# Patient Record
Sex: Female | Born: 1970 | Race: Black or African American | Hispanic: No | Marital: Single | Smoking: Never smoker
Health system: Southern US, Community
[De-identification: ages and names within clinical notes are randomized; demographics above are authoritative.]

## PROBLEM LIST (undated history)

## (undated) DIAGNOSIS — M65939 Unspecified synovitis and tenosynovitis, unspecified forearm: Secondary | ICD-10-CM

## (undated) DIAGNOSIS — D259 Leiomyoma of uterus, unspecified: Secondary | ICD-10-CM

## (undated) DIAGNOSIS — D649 Anemia, unspecified: Secondary | ICD-10-CM

## (undated) DIAGNOSIS — E119 Type 2 diabetes mellitus without complications: Secondary | ICD-10-CM

## (undated) DIAGNOSIS — Z5189 Encounter for other specified aftercare: Secondary | ICD-10-CM

## (undated) DIAGNOSIS — Z9289 Personal history of other medical treatment: Secondary | ICD-10-CM

## (undated) DIAGNOSIS — M659 Synovitis and tenosynovitis, unspecified: Secondary | ICD-10-CM

## (undated) DIAGNOSIS — E539 Vitamin B deficiency, unspecified: Secondary | ICD-10-CM

## (undated) DIAGNOSIS — I5189 Other ill-defined heart diseases: Secondary | ICD-10-CM

## (undated) DIAGNOSIS — Z8489 Family history of other specified conditions: Secondary | ICD-10-CM

## (undated) HISTORY — DX: Other ill-defined heart diseases: I51.89

## (undated) HISTORY — DX: Type 2 diabetes mellitus without complications: E11.9

## (undated) HISTORY — PX: ABDOMINAL HYSTERECTOMY: SHX81

## (undated) HISTORY — PX: CHOLECYSTECTOMY: SHX55

---

## 2005-09-29 ENCOUNTER — Emergency Department: Payer: Self-pay | Admitting: Emergency Medicine

## 2005-11-28 ENCOUNTER — Emergency Department: Payer: Self-pay | Admitting: Emergency Medicine

## 2006-10-22 ENCOUNTER — Emergency Department: Payer: Self-pay | Admitting: Emergency Medicine

## 2007-10-06 ENCOUNTER — Emergency Department: Payer: Self-pay | Admitting: Emergency Medicine

## 2007-12-29 ENCOUNTER — Emergency Department: Payer: Self-pay | Admitting: Emergency Medicine

## 2009-07-15 ENCOUNTER — Emergency Department: Payer: Self-pay | Admitting: Emergency Medicine

## 2009-09-12 ENCOUNTER — Inpatient Hospital Stay: Payer: Self-pay | Admitting: Internal Medicine

## 2012-08-27 DIAGNOSIS — D259 Leiomyoma of uterus, unspecified: Secondary | ICD-10-CM

## 2012-08-27 HISTORY — DX: Leiomyoma of uterus, unspecified: D25.9

## 2013-06-04 ENCOUNTER — Inpatient Hospital Stay: Payer: Self-pay | Admitting: Internal Medicine

## 2013-06-04 LAB — FERRITIN: Ferritin (ARMC): <1 ng/mL — ABNORMAL LOW (ref 8–388)

## 2013-06-04 LAB — CBC
HCT: 18.7 % — ABNORMAL LOW (ref 35.0–47.0)
HGB: 5.7 g/dL — ABNORMAL LOW (ref 12.0–16.0)
MCHC: 30.2 g/dL — ABNORMAL LOW (ref 32.0–36.0)
Platelet: 343 10*3/uL (ref 150–440)
RDW: 33.7 % — ABNORMAL HIGH (ref 11.5–14.5)
WBC: 5.5 10*3/uL (ref 3.6–11.0)

## 2013-06-04 LAB — IRON AND TIBC
Iron Bind.Cap.(Total): 579 ug/dL — ABNORMAL HIGH (ref 250–450)
Iron: 17 ug/dL — ABNORMAL LOW (ref 50–170)

## 2013-06-04 LAB — TROPONIN I: Troponin-I: <0.02 ng/mL

## 2013-06-04 LAB — BASIC METABOLIC PANEL
Anion Gap: 5 — ABNORMAL LOW (ref 7–16)
Chloride: 109 mmol/L — ABNORMAL HIGH (ref 98–107)
Co2: 25 mmol/L (ref 21–32)
Creatinine: 0.66 mg/dL (ref 0.60–1.30)
EGFR (African American): >60
EGFR (Non-African Amer.): >60
Glucose: 110 mg/dL — ABNORMAL HIGH (ref 65–99)
Osmolality: 277 (ref 275–301)
Potassium: 3.9 mmol/L (ref 3.5–5.1)
Sodium: 139 mmol/L (ref 136–145)

## 2013-06-05 LAB — CBC WITH DIFFERENTIAL/PLATELET
Basophil #: 0 10*3/uL (ref 0.0–0.1)
Basophil %: 0.2 %
Eosinophil #: 0 10*3/uL (ref 0.0–0.7)
Eosinophil %: 0.1 %
HCT: 23.4 % — ABNORMAL LOW (ref 35.0–47.0)
HGB: 7.4 g/dL — ABNORMAL LOW (ref 12.0–16.0)
Lymphocyte #: 2.7 10*3/uL (ref 1.0–3.6)
Lymphocyte %: 43.8 %
MCHC: 31.6 g/dL — ABNORMAL LOW (ref 32.0–36.0)
Monocyte %: 11.6 %
Neutrophil #: 2.7 10*3/uL (ref 1.4–6.5)
Neutrophil %: 44.3 %
RBC: 3.39 10*6/uL — ABNORMAL LOW (ref 3.80–5.20)
RDW: 35.7 % — ABNORMAL HIGH (ref 11.5–14.5)

## 2013-06-05 LAB — BASIC METABOLIC PANEL
BUN: 8 mg/dL (ref 7–18)
Creatinine: 0.63 mg/dL (ref 0.60–1.30)
EGFR (African American): >60
EGFR (Non-African Amer.): >60
Glucose: 100 mg/dL — ABNORMAL HIGH (ref 65–99)
Osmolality: 278 (ref 275–301)
Sodium: 140 mmol/L (ref 136–145)

## 2013-06-05 LAB — TROPONIN I: Troponin-I: <0.02 ng/mL

## 2013-06-05 LAB — PROTIME-INR: INR: 1.1

## 2013-08-10 ENCOUNTER — Ambulatory Visit: Payer: Self-pay | Admitting: Obstetrics and Gynecology

## 2013-08-10 ENCOUNTER — Emergency Department: Payer: Self-pay | Admitting: Emergency Medicine

## 2013-08-10 LAB — BASIC METABOLIC PANEL
Chloride: 106 mmol/L (ref 98–107)
EGFR (Non-African Amer.): >60
Glucose: 91 mg/dL (ref 65–99)
Osmolality: 274 (ref 275–301)
Potassium: 3.2 mmol/L — ABNORMAL LOW (ref 3.5–5.1)
Sodium: 138 mmol/L (ref 136–145)

## 2014-03-08 ENCOUNTER — Emergency Department: Payer: Self-pay | Admitting: Emergency Medicine

## 2014-07-30 ENCOUNTER — Emergency Department: Payer: Self-pay | Admitting: Emergency Medicine

## 2014-12-17 NOTE — Consult Note (Signed)
PATIENT NAME:  Sharon Marquez, Sharon Marquez MR#:  [id number] DATE OF BIRTH:  [birth year]  DATE OF CONSULTATION:  06/05/2013  REFERRING PHYSICIAN:  Dr. Max Sane CONSULTING PHYSICIAN:  Boykin Nearing, MD  HISTORY OF PRESENT ILLNESS:  This is a 44 year old gravida 1, para 1, patient admitted to [facility] on 06/04/13 for chest pain. The patient was noted to have severe anemia with a hemoglobin of 5.7. The patient by history has a year of heavy menstrual flow bleeding for 11 days. The first several days a large amount of clotting. The patient has not been on contraception. She is not sexually active. The patient was admitted to [facility], received 2 units of blood. Hemoglobin has come up to 7 today.   PAST MEDICAL HISTORY: 1.  Menorrhagia. 2.  Cholecystitis.   PAST SURGICAL HISTORY:  Laparoscopic cholecystectomy, 1994.   REVIEW OF SYSTEMS:  (unremarkable except GYN : menorrhagia + fibroids)<<gyn : menorrhagia and fibroids> (we do not have a ROS template for Dr. Ouida Sills)  (Per template except for:  GENITOURINARY:  Menorrhagia. GENERAL:  Obesity. GASTROINTESTINAL:  Cholecystitis.)  FAMILY HISTORY:  No gynecologic cancers.   MEDICATIONS:  None.   SOCIAL HISTORY:  Does not smoke, does not drink.   ALLERGIES:  No known drug allergies.   PHYSICAL EXAMINATION:  GENERAL:  Well-developed, well-nourished, obese black female.  VITAL SIGNS:  Weight 252, blood pressure 112/68, temperature 98.3, pulse 70, BMI 46.  LUNGS:  Clear to auscultation.  CARDIOVASCULAR:  Regular rate and rhythm.  ABDOMEN:  Obese, soft, nontender.  PELVIC:  External genitalia, Tanner stage V. Vulva and labia:  No lesions. Vagina:  Normal physiologic discharge. Cervix:  No lesions. Uterus:  Eight weeks in size, slightly irregular. The exam is limited based on the patient's body habitus. Adnexa:  No mass, nontender.  ENDOMETRIAL BIOPSY PROCEDURE:  Betadine prep to the cervix. Pipelle  catheter placed the endometrial cavity. The uterus sounds to 7 cm. Adequate tissue removed.   ASSESSMENT:  Menorrhagia with severe anemia.   PLAN: 1.  Endometrial biopsy performed at [facility]. 2.  I have spoken to the patient regarding getting a pelvic ultrasound at [facility] to rule out fibroids, especially submucosal fibroids. 3.  I spoke to the patient regarding treatment options including continuous birth control pills versus a surgical intervention and ablative procedures, including NovaSure and Her Option. The patient will be started on Ortho-Novum or Necon 1/35, one tablet daily while in the hospital. Ultimately, the patient returned to see me at [facility] for further discussion on continued therapy.  ____________________________ Boykin Nearing, MD tjs:jm D: 06/05/2013 10:30:00 ET T: 06/05/2013 10:52:47 ET JOB#: [id number]  cc: Vipul S. Manuella Ghazi, MD Boykin Nearing, MD, <Dictator>   Boykin Nearing MD ELECTRONICALLY SIGNED 06/15/2013 22:08

## 2014-12-17 NOTE — Discharge Summary (Signed)
PATIENT NAME:  Sharon Marquez, FERCH MR#:  [id number] DATE OF BIRTH:  08-26-1971  DATE OF ADMISSION:  06/04/2013 DATE OF DISCHARGE:  06/05/2013  PRESENTING COMPLAINT: Chest pain.   DISCHARGE DIAGNOSES: 1.  Iron deficiency anemia status post 2 units blood transfusion, suspected due to menorrhagia.  2.  History of menorrhagia.  3.  Morbid obesity.   CONDITION ON DISCHARGE: Fair.   DISCHARGE MEDICATIONS: 1.  Neocon 35 one tablet p.o. daily.  2.  Seasonique biphasic extended release p.o. daily.  3.  Ferrous sulfate 325 mg p.o. t.i.d.   DISCHARGE DIET: Regular.   DISCHARGE FOLLOWUP: With Dr. Ouida Sills OB/GYN as outpatient.   LABORATORY DATA: At discharge: H and H 7.4 and 23.4, white count 6.1 and MCV 69. Basic metabolic panel within normal limits. The patient's hemoglobin at admission was 5.7. Serum iron is 17.   CONSULTANTS: OB/GYN with Dr. Ouida Sills.   BRIEF SUMMARY OF HOSPITAL COURSE: [name] is a 44 year old African American female with past medical history of iron deficiency anemia who comes to the Emergency Room with some chest pain. She was found to have hemoglobin of 5.7. She was admitted, received 2 units of blood transfusion. Her iron deficiency anemia is likely due to her menorrhagia, ongoing for the last several months. The patient just had her menstrual cycle. She is not actively bleeding. She was seen by Dr. Ouida Sills as outpatient and recommended pelvic ultrasound and follow up as outpatient for further evaluation. In the meantime, she was placed on birth control pills by Dr. Ouida Sills. The patient tolerated blood transfusion well. She was explained to keep up the appointment with Dr. Ouida Sills. Hospital stay otherwise remained stable. The patient remained a FULL CODE.   TIME SPENT: 40 minutes. ____________________________ Hart Rochester Posey Pronto, MD sap:sb D: 06/05/2013 15:30:17 ET T: 06/05/2013 17:17:28 ET JOB#: [id number]  cc: Dove Gresham A. Posey Pronto, MD, <Dictator> Boykin Nearing, MD Ilda Basset MD ELECTRONICALLY SIGNED 06/19/2013 15:04

## 2014-12-17 NOTE — H&P (Signed)
PATIENT NAME:  Sharon Marquez, Sharon Marquez MR#:  [id number] DATE OF BIRTH:  [birth year]  DATE OF ADMISSION:  06/04/2013  PRIMARY CARE PHYSICIAN: None.  REQUESTING PHYSICIAN: Dr. Neoma Laming.  CHIEF COMPLAINT: Chest pain.   HISTORY OF PRESENT ILLNESS: The patient is a 44 year old female with no significant medical problems. Is being admitted for severe anemia. The patient was at work around 7:30 in the morning when she started having severe  chest pain, lasted about five minutes, associated with shortness of breath and diaphoresis. Pain went away on its own, but again it started back and kept going on and off until she decided to come to the Emergency Department, and she was really worried as her father also had heart problems.   While in the ED she was found to have severe anemia with a hemoglobin of 5.7, with a hematocrit of 18.7 and is being admitted for further evaluation and management. She is on  2 units of packed red blood cells. On further talking, the patient reports having a known history of anemia before, with a hemoglobin as low as down to 8 in the past, which she attributes likely to heavy periods. The patient has not seen any OB/GYN doctor, and would like to see someone if possible while in the hospital.   PAST MEDICAL HISTORY: History of anemia.   PAST SURGICAL HISTORY:  Cholecystectomy.   ALLERGIES: No known drug allergies.    MEDICATIONS AT HOME: Ibuprofen, as needed for headache.   SOCIAL HISTORY: She works in a [employer].   No smoking or alcohol.   FAMILY HISTORY: Father had heart problems.   OB/GYN HISTORY: She has been having longer and longer menstrual periods. Her last menstrual period started last Tuesday, but she is still spotting today, and she reports this has been getting worse lately.   REVIEW OF SYSTEMS: CONSTITUTIONAL: No fever. Positive fatigue and weakness.  EYES: No blurred or double vision.  ENT: No tinnitus or ear pain.  RESPIRATORY: No cough, wheezing or  hemoptysis.  CARDIOVASCULAR: Positive for chest pressure, shortness of breath and diaphoresis.  GASTROINTESTINAL: No nausea, vomiting, diarrhea.  GENITOURINARY: No recent dysuria, hematuria.  ENDOCRINE: No polyuria or nocturia.  HEMATOLOGY: Positive for anemia. No easy bruising or bleeding. Heavy menstrual period.  SKIN: No rash or lesion.  MUSCULOSKELETAL: No arthritis or muscle cramp.  NEUROLOGIC: No tingling, numbness, weakness.  PSYCHIATRIC: No history of anxiety or depression.   PHYSICAL EXAMINATION: VITAL SIGNS: Temperature 98.9, heart rate 77, respirations 18 per minute, blood pressure 125/58. Initial blood pressure was 99/55. She is saturating 95% on room air.  GENERAL: The patient is a 44 year-old [place] American female lying in the bed comfortably. Pupils are equal, round, reactive to light and accommodation. No scleral icterus.  HEAD: Extraocular muscles intact.  HEENT: Head atraumatic, normocephalic. Oropharynx and nasopharynx clear.  NECK: Supple. No masses. No thyroid tenderness. EYES: She does have scleral pallor.  LUNGS: Clear to auscultation bilaterally. No wheezing, rales, rhonchi or crepitation.  CARDIOVASCULAR: S1, S2 normal. No murmurs, rubs, or gallop.  ABDOMEN: Soft, nontender, nondistended. Bowel sounds present. No organomegaly or mass.  EXTREMITIES: No pedal edema, cyanosis or clubbing.  NEUROLOGIC: Nonfocal examination. Cranial nerves II-XII intact. Muscle strength 5/5 in all extremities. Sensation intact. PSYCHIATRIC:  Patient is awake, alert, and oriented x 3.  SKIN: No obvious rash, lesion.   LAB RESULTS: Normal BMP. Normal troponin. CBC showed white count 5.5, hemoglobin 5.7, hematocrit 18.7, platelets 343.   EKG shows normal  sinus rhythm, no ST-T wave changes.   IMPRESSION AND PLAN:  1.  Severe symptomatic anemia, with hemoglobin of 5.7. She is typed and crossed in the Emergency Department and had already been on 2 units of packed red blood cells by the  Emergency Department physician. Her anemia is microcytic in nature, likely due to heavy and long menstruation blood loss. Will get iron studies at this time and monitor her hemoglobin stool.  2.  Unstable angina, with chest pressure, diaphoresis and shortness of breath, likely due to oxygen-carrying capacity due to severe anemia. She denies any known heart disease. We are going to do serial troponins and monitor on off-unit telemetry.  3. Menometrorrhagia, with long, heavy menstrual periods: Will consult with OB/GYN for further input. She is wanting to see someone. She has never had any OB followups before.   CODE STATUS: FULL CODE.   Total time taking care of this patient is 45 minutes.    ____________________________ Lucina Mellow. Manuella Ghazi, MD vss:dm D: 06/04/2013 13:35:53 ET T: 06/04/2013 14:08:48 ET JOB#: [id number]  cc: Amiree No S. Manuella Ghazi, MD, <Dictator> Lucina Mellow [facility] MD ELECTRONICALLY SIGNED 06/05/2013 13:22

## 2015-03-08 ENCOUNTER — Emergency Department: Payer: Managed Care, Other (non HMO)

## 2015-03-08 ENCOUNTER — Encounter: Payer: Self-pay | Admitting: Emergency Medicine

## 2015-03-08 ENCOUNTER — Emergency Department
Admission: EM | Admit: 2015-03-08 | Discharge: 2015-03-08 | Disposition: A | Discharge status: Home / Self Care | Payer: Managed Care, Other (non HMO) | Attending: Emergency Medicine | Admitting: Emergency Medicine

## 2015-03-08 ENCOUNTER — Other Ambulatory Visit: Payer: Self-pay

## 2015-03-08 DIAGNOSIS — R079 Chest pain, unspecified: Secondary | ICD-10-CM | POA: Diagnosis not present

## 2015-03-08 DIAGNOSIS — R0602 Shortness of breath: Secondary | ICD-10-CM | POA: Diagnosis not present

## 2015-03-08 HISTORY — DX: Anemia, unspecified: D64.9

## 2015-03-08 LAB — TROPONIN I
Troponin I: <0.03 ng/mL (ref <0.031)
Troponin I: <0.03 ng/mL (ref <0.031)

## 2015-03-08 LAB — BASIC METABOLIC PANEL
Anion gap: 6 (ref 5–15)
BUN: 10 mg/dL (ref 6–20)
CHLORIDE: 106 mmol/L (ref 101–111)
CO2: 25 mmol/L (ref 22–32)
Calcium: 8.5 mg/dL — ABNORMAL LOW (ref 8.9–10.3)
Creatinine, Ser: 0.63 mg/dL (ref 0.44–1.00)
GFR calc non Af Amer: >60 mL/min (ref >60)
Glucose, Bld: 170 mg/dL — ABNORMAL HIGH (ref 65–99)
Potassium: 3.3 mmol/L — ABNORMAL LOW (ref 3.5–5.1)
SODIUM: 137 mmol/L (ref 135–145)

## 2015-03-08 LAB — CBC
HCT: 19.9 % — ABNORMAL LOW (ref 35.0–47.0)
HEMOGLOBIN: 5.8 g/dL — AB (ref 12.0–16.0)
MCH: 18.5 pg — AB (ref 26.0–34.0)
MCHC: 29.1 g/dL — AB (ref 32.0–36.0)
MCV: 63.7 fL — ABNORMAL LOW (ref 80.0–100.0)
Platelets: 303 10*3/uL (ref 150–440)
RBC: 3.13 MIL/uL — ABNORMAL LOW (ref 3.80–5.20)
RDW: 37.1 % — ABNORMAL HIGH (ref 11.5–14.5)
WBC: 6.1 10*3/uL (ref 3.6–11.0)

## 2015-03-08 MED ORDER — IPRATROPIUM-ALBUTEROL 0.5-2.5 (3) MG/3ML IN SOLN
3.0000 mL | Freq: Once | RESPIRATORY_TRACT | Status: AC
  Administered 2015-03-08: 3 mL via RESPIRATORY_TRACT
  Filled 2015-03-08: qty 3

## 2015-03-08 MED ORDER — GI COCKTAIL ~~LOC~~
30.0000 mL | Freq: Once | ORAL | Status: AC
  Administered 2015-03-08: 30 mL via ORAL
  Filled 2015-03-08: qty 30

## 2015-03-08 MED ORDER — IBUPROFEN 800 MG PO TABS: 800.0000 mg | ORAL_TABLET | Freq: Three times a day (TID) | ORAL | Status: DC | PRN

## 2015-03-08 NOTE — ED Provider Notes (Signed)
[facility] Emergency Department Provider Note   ____________________________________________  Time seen: 5035  I have reviewed the triage vital signs and the nursing notes.   HISTORY  Chief Complaint Chest Pain   History limited by: Not Limited   HPI Sharon Marquez is a 44 y.o. female who presents to the emergency department today because of chest pain.The patient states that her chest pain started this morning. She describes it as a tightness in the central part of her chest. It is accompanied with some shortness of breath. Patient states she attempted to go to work however at work the pain got worse. The patient states he has never had pain like this before. Patient denies any recent travel no history of DVTs. Patient denies any history of high blood pressure, diabetes. She does have some family history of heart disease however this was at an older age.     Past Medical History  Diagnosis Date  . Anemia     There are no active problems to display for this patient.   History reviewed. No pertinent past surgical history.  No current outpatient prescriptions on file.  Allergies Review of patient's allergies indicates no known allergies.  History reviewed. No pertinent family history.  Social History History  Substance Use Topics  . Smoking status: Never Smoker   . Smokeless tobacco: Not on file  . Alcohol Use: No    Review of Systems  Constitutional: Negative for fever. Cardiovascular: Positive for chest pain. Respiratory: Positive for shortness of breath. Gastrointestinal: Negative for abdominal pain, vomiting and diarrhea. Genitourinary: Negative for dysuria. Musculoskeletal: Negative for back pain. Skin: Negative for rash. Neurological: Negative for headaches, focal weakness or numbness.   10-point ROS otherwise negative.  ____________________________________________   PHYSICAL EXAM:  VITAL SIGNS: ED Triage Vitals  Enc  Vitals Group     BP 03/08/15 1033 115/69 mmHg     Pulse Rate 03/08/15 1033 84     Resp 03/08/15 1033 22     Temp 03/08/15 1033 98.7 F (37.1 C)     Temp Source 03/08/15 1033 Oral     SpO2 03/08/15 1033 100 %     Weight 03/08/15 1033 265 lb (120.203 kg)     Height 03/08/15 1033 5\' 3"  (1.6 m)     Head Cir --      Peak Flow --      Pain Score 03/08/15 1034 9   Constitutional: Alert and oriented. Well appearing and in no distress. Eyes: Conjunctivae are normal. PERRL. Normal extraocular movements. ENT   Head: Normocephalic and atraumatic.   Nose: No congestion/rhinnorhea.   Mouth/Throat: Mucous membranes are moist.   Neck: No stridor. Hematological/Lymphatic/Immunilogical: No cervical lymphadenopathy. Cardiovascular: Normal rate, regular rhythm.  No murmurs, rubs, or gallops. Respiratory: Normal respiratory effort without tachypnea nor retractions. Breath sounds are clear and equal bilaterally. No wheezes/rales/rhonchi. Gastrointestinal: Soft and nontender. No distention.  Genitourinary: Deferred Musculoskeletal: Normal range of motion in all extremities. No joint effusions.  No lower extremity tenderness nor edema. Neurologic:  Normal speech and language. No gross focal neurologic deficits are appreciated. Speech is normal.  Skin:  Skin is warm, dry and intact. No rash noted. Psychiatric: Mood and affect are normal. Speech and behavior are normal. Patient exhibits appropriate insight and judgment.  ____________________________________________    LABS (pertinent positives/negatives)  Labs Reviewed  BASIC METABOLIC PANEL - Abnormal; Notable for the following:    Potassium 3.3 (*)    Glucose, Bld 170 (*)  Calcium 8.5 (*)    All other components within normal limits  CBC - Abnormal; Notable for the following:    RBC 3.13 (*)    Hemoglobin 5.8 (*)    HCT 19.9 (*)    MCV 63.7 (*)    MCH 18.5 (*)    MCHC 29.1 (*)    RDW 37.1 (*)    All other components within  normal limits  TROPONIN I     ____________________________________________   EKG  I, Nance Pear, attending physician, personally viewed and interpreted this EKG  EKG Time: 1035 Rate: 81 Rhythm: NSR Axis: left axis deviation Intervals: qtc 453 QRS: narrow ST changes: no st elevation  ____________________________________________    RADIOLOGY  CXR IMPRESSION: Negative. No active cardiopulmonary disease.  ____________________________________________   PROCEDURES  Procedure(s) performed: None  Critical Care performed: No  ____________________________________________   INITIAL IMPRESSION / ASSESSMENT AND PLAN / ED COURSE  Pertinent labs & imaging results that were available during my care of the patient were reviewed by me and considered in my medical decision making (see chart for details).  Patient presents to the emergency department with episode of chest pain and shortness of breath. EKG and 2 sets of trop without concerning findings. Chest x-ray without any concerning findings. Attempted DuoNeb without any significant improvement. Perc negative. At this point I think ACS, PE, pneumothorax, pneumonia unlikely. I think perhaps patient either has pericarditis or pleuritis. Will give high-dose ibuprofen. Did discuss return precautions with patient.  ____________________________________________   FINAL CLINICAL IMPRESSION(S) / ED DIAGNOSES  Final diagnoses:  Chest pain, unspecified chest pain type     Nance Pear, MD 03/08/15 1544

## 2015-03-08 NOTE — Discharge Instructions (Signed)
Please seek medical attention for any high fevers, chest pain, shortness of breath, change in behavior, persistent vomiting, bloody stool or any other new or concerning symptoms. ° °Chest Pain (Nonspecific) °It is often hard to give a specific diagnosis for the cause of chest pain. There is always a chance that your pain could be related to something serious, such as a heart attack or a blood clot in the lungs. You need to follow up with your health care provider for further evaluation. °CAUSES  °· Heartburn. °· Pneumonia or bronchitis. °· Anxiety or stress. °· Inflammation around your heart (pericarditis) or lung (pleuritis or pleurisy). °· A blood clot in the lung. °· A collapsed lung (pneumothorax). It can develop suddenly on its own (spontaneous pneumothorax) or from trauma to the chest. °· Shingles infection (herpes zoster virus). °The chest wall is composed of bones, muscles, and cartilage. Any of these can be the source of the pain. °· The bones can be bruised by injury. °· The muscles or cartilage can be strained by coughing or overwork. °· The cartilage can be affected by inflammation and become sore (costochondritis). °DIAGNOSIS  °Lab tests or other studies may be needed to find the cause of your pain. Your health care provider may have you take a test called an ambulatory electrocardiogram (ECG). An ECG records your heartbeat patterns over a 24-hour period. You may also have other tests, such as: °· Transthoracic echocardiogram (TTE). During echocardiography, sound waves are used to evaluate how blood flows through your heart. °· Transesophageal echocardiogram (TEE). °· Cardiac monitoring. This allows your health care provider to monitor your heart rate and rhythm in real time. °· Holter monitor. This is a portable device that records your heartbeat and can help diagnose heart arrhythmias. It allows your health care provider to track your heart activity for several days, if needed. °· Stress tests by  exercise or by giving medicine that makes the heart beat faster. °TREATMENT  °· Treatment depends on what may be causing your chest pain. Treatment may include: °¨ Acid blockers for heartburn. °¨ Anti-inflammatory medicine. °¨ Pain medicine for inflammatory conditions. °¨ Antibiotics if an infection is present. °· You may be advised to change lifestyle habits. This includes stopping smoking and avoiding alcohol, caffeine, and chocolate. °· You may be advised to keep your head raised (elevated) when sleeping. This reduces the chance of acid going backward from your stomach into your esophagus. °Most of the time, nonspecific chest pain will improve within 2-3 days with rest and mild pain medicine.  °HOME CARE INSTRUCTIONS  °· If antibiotics were prescribed, take them as directed. Finish them even if you start to feel better. °· For the next few days, avoid physical activities that bring on chest pain. Continue physical activities as directed. °· Do not use any tobacco products, including cigarettes, chewing tobacco, or electronic cigarettes. °· Avoid drinking alcohol. °· Only take medicine as directed by your health care provider. °· Follow your health care provider's suggestions for further testing if your chest pain does not go away. °· Keep any follow-up appointments you made. If you do not go to an appointment, you could develop lasting (chronic) problems with pain. If there is any problem keeping an appointment, call to reschedule. °SEEK MEDICAL CARE IF:  °· Your chest pain does not go away, even after treatment. °· You have a rash with blisters on your chest. °· You have a fever. °SEEK IMMEDIATE MEDICAL CARE IF:  °· You have increased chest   pain or pain that spreads to your arm, neck, jaw, back, or abdomen. °· You have shortness of breath. °· You have an increasing cough, or you cough up blood. °· You have severe back or abdominal pain. °· You feel nauseous or vomit. °· You have severe weakness. °· You  faint. °· You have chills. °This is an emergency. Do not wait to see if the pain will go away. Get medical help at once. Call your local emergency services (911 in [place] Do not drive yourself to the hospital. °MAKE SURE YOU:  °· Understand these instructions. °· Will watch your condition. °· Will get help right away if you are not doing well or get worse. °Document Released: 05/23/2005 Document Revised: 08/18/2013 Document Reviewed: 03/18/2008 °ExitCare® Patient Information ©2015 [employer]. This information is not intended to replace advice given to you by your health care provider. Make sure you discuss any questions you have with your health care provider. ° °

## 2015-03-08 NOTE — ED Notes (Signed)
Patient to ED with c/o crushing type chest pain that started this morning along with diaphoresis, patient reports she went to work but had to leave patient also reports some right side heaviness.

## 2015-03-08 NOTE — ED Notes (Signed)
MD at bedside. 

## 2015-03-08 NOTE — ED Notes (Addendum)
Accompanied with SOB intermittently since CP began. Pt alert and oriented X4, active, cooperative, pt in NAD. RR even and unlabored, color WNL.

## 2015-03-08 NOTE — ED Notes (Signed)
Patient transported to X-ray 

## 2015-06-01 ENCOUNTER — Encounter: Payer: Self-pay | Admitting: Emergency Medicine

## 2015-06-01 ENCOUNTER — Emergency Department
Admission: EM | Admit: 2015-06-01 | Discharge: 2015-06-05 | Disposition: A | Discharge status: Home / Self Care | Payer: Managed Care, Other (non HMO) | Attending: Emergency Medicine | Admitting: Emergency Medicine

## 2015-06-01 ENCOUNTER — Emergency Department: Payer: Managed Care, Other (non HMO)

## 2015-06-01 DIAGNOSIS — D649 Anemia, unspecified: Secondary | ICD-10-CM | POA: Diagnosis not present

## 2015-06-01 DIAGNOSIS — R079 Chest pain, unspecified: Secondary | ICD-10-CM | POA: Insufficient documentation

## 2015-06-01 DIAGNOSIS — D473 Essential (hemorrhagic) thrombocythemia: Secondary | ICD-10-CM | POA: Diagnosis not present

## 2015-06-01 DIAGNOSIS — R0602 Shortness of breath: Secondary | ICD-10-CM | POA: Diagnosis present

## 2015-06-01 LAB — BASIC METABOLIC PANEL
Anion gap: 7 (ref 5–15)
BUN: 6 mg/dL (ref 6–20)
CALCIUM: 8.7 mg/dL — AB (ref 8.9–10.3)
CO2: 25 mmol/L (ref 22–32)
CREATININE: 0.6 mg/dL (ref 0.44–1.00)
Chloride: 108 mmol/L (ref 101–111)
GFR calc Af Amer: >60 mL/min (ref >60)
Glucose, Bld: 136 mg/dL — ABNORMAL HIGH (ref 65–99)
Potassium: 3.6 mmol/L (ref 3.5–5.1)
SODIUM: 140 mmol/L (ref 135–145)

## 2015-06-01 LAB — CBC
HCT: 19 % — ABNORMAL LOW (ref 35.0–47.0)
Hemoglobin: 5.2 g/dL — ABNORMAL LOW (ref 12.0–16.0)
MCH: 16.9 pg — ABNORMAL LOW (ref 26.0–34.0)
MCHC: 27.4 g/dL — ABNORMAL LOW (ref 32.0–36.0)
MCV: 61.6 fL — ABNORMAL LOW (ref 80.0–100.0)
PLATELETS: 893 10*3/uL — AB (ref 150–440)
RBC: 3.09 MIL/uL — AB (ref 3.80–5.20)
RDW: 35.4 % — ABNORMAL HIGH (ref 11.5–14.5)
WBC: 3.9 10*3/uL (ref 3.6–11.0)

## 2015-06-01 LAB — PREPARE RBC (CROSSMATCH)

## 2015-06-01 LAB — TROPONIN I

## 2015-06-01 LAB — ABO/RH: ABO/RH(D): O POS

## 2015-06-01 MED ORDER — FERROUS SULFATE ER 142 (45 FE) MG PO TBCR: 1.0000 | EXTENDED_RELEASE_TABLET | Freq: Two times a day (BID) | ORAL | Status: DC

## 2015-06-01 MED ORDER — SODIUM CHLORIDE 0.9 % IV SOLN
10.0000 mL/h | Freq: Once | INTRAVENOUS | Status: AC
  Administered 2015-06-01: 10 mL/h via INTRAVENOUS

## 2015-06-01 MED ORDER — HYDROCODONE-ACETAMINOPHEN 5-325 MG PO TABS
1.0000 | ORAL_TABLET | Freq: Once | ORAL | Status: AC
  Administered 2015-06-01: 1 via ORAL
  Filled 2015-06-01: qty 1

## 2015-06-01 MED ORDER — TRAMADOL HCL 50 MG PO TABS: 50.0000 mg | ORAL_TABLET | Freq: Four times a day (QID) | ORAL | Status: AC | PRN

## 2015-06-01 NOTE — ED Notes (Signed)
Pt ambulatory in ed with c/o chest pain and shortness of breath. Pt with hx of anemia and low iron.

## 2015-06-01 NOTE — Discharge Instructions (Signed)
Please take your iron supplement as prescribed. Please follow-up with OB/GYN by calling the number provided for a follow-up appointment as soon as possible. Return to the emergency department for any further chest pain, shortness of breath, lightheadedness for a few pass out, or any other symptom personally concerning to yourself.   Anemia, Nonspecific Anemia is a condition in which the concentration of red blood cells or hemoglobin in the blood is below normal. Hemoglobin is a substance in red blood cells that carries oxygen to the tissues of the body. Anemia results in not enough oxygen reaching these tissues.  CAUSES  Common causes of anemia include:   Excessive bleeding. Bleeding may be internal or external. This includes excessive bleeding from periods (in women) or from the intestine.   Poor nutrition.   Chronic kidney, thyroid, and liver disease.  Bone marrow disorders that decrease red blood cell production.  Cancer and treatments for cancer.  HIV, AIDS, and their treatments.  Spleen problems that increase red blood cell destruction.  Blood disorders.  Excess destruction of red blood cells due to infection, medicines, and autoimmune disorders. SIGNS AND SYMPTOMS   Minor weakness.   Dizziness.   Headache.  Palpitations.   Shortness of breath, especially with exercise.   Paleness.  Cold sensitivity.  Indigestion.  Nausea.  Difficulty sleeping.  Difficulty concentrating. Symptoms may occur suddenly or they may develop slowly.  DIAGNOSIS  Additional blood tests are often needed. These help your health care provider determine the best treatment. Your health care provider will check your stool for blood and look for other causes of blood loss.  TREATMENT  Treatment varies depending on the cause of the anemia. Treatment can include:   Supplements of iron, vitamin F75, or folic acid.   Hormone medicines.   A blood transfusion. This may be needed if  blood loss is severe.   Hospitalization. This may be needed if there is significant continual blood loss.   Dietary changes.  Spleen removal. HOME CARE INSTRUCTIONS Keep all follow-up appointments. It often takes many weeks to correct anemia, and having your health care provider check on your condition and your response to treatment is very important. SEEK IMMEDIATE MEDICAL CARE IF:   You develop extreme weakness, shortness of breath, or chest pain.   You become dizzy or have trouble concentrating.  You develop heavy vaginal bleeding.   You develop a rash.   You have bloody or black, tarry stools.   You faint.   You vomit up blood.   You vomit repeatedly.   You have abdominal pain.  You have a fever or persistent symptoms for more than 2-3 days.   You have a fever and your symptoms suddenly get worse.   You are dehydrated.  MAKE SURE YOU:  Understand these instructions.  Will watch your condition.  Will get help right away if you are not doing well or get worse.   This information is not intended to replace advice given to you by your health care provider. Make sure you discuss any questions you have with your health care provider.   Document Released: 09/20/2004 Document Revised: 04/15/2013 Document Reviewed: 02/06/2013 Elsevier Interactive Patient Education [employer].

## 2015-06-01 NOTE — ED Provider Notes (Addendum)
[facility] Emergency Department Provider Note  Time seen: 2:09 PM  I have reviewed the triage vital signs and the nursing notes.   HISTORY  Chief Complaint Chest Pain and Shortness of Breath    HPI Sharon Marquez is a 44 y.o. female with a past medical history of anemia who presents the emergency department with symptoms of dyspnea on exertion and chest pain/pressure with exertion. According to the patient for the past 1-2 weeks she has had dyspnea on exertion, she states for the past 4-5 days she is also beginning chest pain/pressure anytime she exerts herself. Denies any cough, congestion. Does state nausea occasionally but denies any vomiting or diaphoresis. Patient has a history of anemia requiring transfusion in the past. Patient supposed be on iron supplements but is not taking them.Patient states heavy menstrual bleeding, her last period ended 2 weeks ago. She has been told by an OB/GYN that she needs a hysterectomy, but she did not have insurance at the time and never pursued this option. Denies any chest pain currently. Denies any shortness of breath currently. Describes her symptoms as moderate when exerting herself.     Past Medical History  Diagnosis Date  . Anemia     There are no active problems to display for this patient.   History reviewed. No pertinent past surgical history.  Current Outpatient Rx  Name  Route  Sig  Dispense  Refill  . acetaminophen (TYLENOL) 500 MG tablet   Oral   Take 1,000 mg by mouth every 6 (six) hours as needed.         Marland Kitchen ibuprofen (ADVIL,MOTRIN) 800 MG tablet   Oral   Take 1 tablet (800 mg total) by mouth every 8 (eight) hours as needed.   20 tablet   0     Allergies Review of patient's allergies indicates no known allergies.  History reviewed. No pertinent family history.  Social History Social History  Substance Use Topics  . Smoking status: Never Smoker   . Smokeless tobacco: None  . Alcohol  Use: No    Review of Systems Constitutional: Negative for fever. Cardiovascular: Positive for chest pain with exertion. Respiratory: Positive for shortness of breath with exertion. Gastrointestinal: Negative for abdominal pain, vomiting and diarrhea. Neurological: Negative for headache 10-point ROS otherwise negative.  ____________________________________________   PHYSICAL EXAM:  VITAL SIGNS: ED Triage Vitals  Enc Vitals Group     BP 06/01/15 1401 122/79 mmHg     Pulse Rate 06/01/15 1059 80     Resp --      Temp 06/01/15 1059 98.2 F (36.8 C)     Temp Source 06/01/15 1059 Oral     SpO2 06/01/15 1059 100 %     Weight 06/01/15 1059 245 lb (111.131 kg)     Height 06/01/15 1059 5\' 3"  (1.6 m)     Head Cir --      Peak Flow --      Pain Score --      Pain Loc --      Pain Edu? --      Excl. in [place]? --    Constitutional: Alert and oriented. Well appearing and in no distress. Eyes: Normal exam ENT   Head: Normocephalic and atraumatic.   Mouth/Throat: Mucous membranes are moist. Cardiovascular: Normal rate, regular rhythm. No murmur Respiratory: Normal respiratory effort without tachypnea nor retractions. Breath sounds are clear Gastrointestinal: Soft and nontender. No distention. Musculoskeletal: Nontender with normal range of motion in  all extremities. No lower extremity tenderness or edema. Neurologic:  Normal speech and language. No gross focal neurologic deficits are appreciated. Speech is normal. Skin:  Skin is warm, dry and intact.  Psychiatric: Mood and affect are normal. Speech and behavior are normal. Patient exhibits appropriate insight and judgment.  ____________________________________________    EKG  EKG reviewed and interpreted by myself shows normal sinus rhythm at 81 bpm, narrow QRS, normal axis, normal intervals, no ST changes noted. Overall normal EKG.  ____________________________________________    RADIOLOGY  Chest x-ray shows no acute  abnormalities.  ____________________________________________    INITIAL IMPRESSION / ASSESSMENT AND PLAN / ED COURSE  Pertinent labs & imaging results that were available during my care of the patient were reviewed by me and considered in my medical decision making (see chart for details).  Patient presents with dyspnea and chest pain on exertion, hemoglobin of 5.2. History of anemia requiring transfusions in the past. They believe her anemia is due to menorrhagia, her last period was 2 weeks ago.  Patient is prescribed iron supplements but admits she has not been taking them. Patient followed up with Dr.Schamerhorn of OB/GYN in the past, we will consult. We will begin blood transfusion. I discussed the risks and benefits with the patient she is agreeable to proceed with transfusion. As the patient is not actively bleeding do not believe she necessarily requires inpatient admission unless OB/GYN believes otherwise  Of note the patient's platelet count is fairly elevated today at 893. Patient may require hematology/oncology workup, which can likely occur as an outpatient.    Discussed with Dr. Ouida Sills. He recommends follow-up in his office this week. I discussed this with the patient she would much prefer to receive for transfusion and go home versus admission.  ----------------------------------------- 8:09 PM on 06/01/2015 ----------------------------------------- Currently receiving unit one of 2. Continues to appear well, we will monitor closely in the emergency department, plan to discharge later tonight.   Discussed with patient need to follow-up with primary care physician for recheck of lab work in one to 2 days. Also discussed her elevated platelets, patient will follow up with her primary care physician regarding this.  ____________________________________________   FINAL CLINICAL IMPRESSION(S) / ED DIAGNOSES  Symptomatic anemia Thrombocytosis   Harvest Dark,  MD 06/01/15 3343  Harvest Dark, MD 06/01/15 2047

## 2015-06-02 LAB — TYPE AND SCREEN
ABO/RH(D): O POS
Antibody Screen: NEGATIVE
Unit division: 0
Unit division: 0

## 2016-01-30 ENCOUNTER — Emergency Department
Admission: EM | Admit: 2016-01-30 | Discharge: 2016-01-30 | Disposition: A | Discharge status: Home / Self Care | Payer: Managed Care, Other (non HMO) | Attending: Emergency Medicine | Admitting: Emergency Medicine

## 2016-01-30 ENCOUNTER — Encounter: Payer: Self-pay | Admitting: Emergency Medicine

## 2016-01-30 ENCOUNTER — Emergency Department: Payer: Managed Care, Other (non HMO)

## 2016-01-30 DIAGNOSIS — Y9301 Activity, walking, marching and hiking: Secondary | ICD-10-CM | POA: Diagnosis not present

## 2016-01-30 DIAGNOSIS — Y999 Unspecified external cause status: Secondary | ICD-10-CM | POA: Diagnosis not present

## 2016-01-30 DIAGNOSIS — M25571 Pain in right ankle and joints of right foot: Secondary | ICD-10-CM | POA: Diagnosis present

## 2016-01-30 DIAGNOSIS — S93401A Sprain of unspecified ligament of right ankle, initial encounter: Secondary | ICD-10-CM | POA: Diagnosis not present

## 2016-01-30 DIAGNOSIS — Z79899 Other long term (current) drug therapy: Secondary | ICD-10-CM | POA: Insufficient documentation

## 2016-01-30 DIAGNOSIS — Y9289 Other specified places as the place of occurrence of the external cause: Secondary | ICD-10-CM | POA: Diagnosis not present

## 2016-01-30 DIAGNOSIS — W010XXA Fall on same level from slipping, tripping and stumbling without subsequent striking against object, initial encounter: Secondary | ICD-10-CM | POA: Diagnosis not present

## 2016-01-30 MED ORDER — TRAMADOL HCL 50 MG PO TABS
50.0000 mg | ORAL_TABLET | Freq: Once | ORAL | Status: AC
  Administered 2016-01-30: 50 mg via ORAL
  Filled 2016-01-30: qty 1

## 2016-01-30 NOTE — ED Notes (Signed)
States she fell earlier today  Having pain to right foot ..increased pain with standing  Positive pulses and good sensation

## 2016-01-30 NOTE — Discharge Instructions (Signed)
Ankle Sprain °An ankle sprain is an injury to the strong, fibrous tissues (ligaments) that hold the bones of your ankle joint together.  °CAUSES °An ankle sprain is usually caused by a fall or by twisting your ankle. Ankle sprains most commonly occur when you step on the outer edge of your foot, and your ankle turns inward. People who participate in sports are more prone to these types of injuries.  °SYMPTOMS  °· Pain in your ankle. The pain may be present at rest or only when you are trying to stand or walk. °· Swelling. °· Bruising. Bruising may develop immediately or within 1 to 2 days after your injury. °· Difficulty standing or walking, particularly when turning corners or changing directions. °DIAGNOSIS  °Your caregiver will ask you details about your injury and perform a physical exam of your ankle to determine if you have an ankle sprain. During the physical exam, your caregiver will press on and apply pressure to specific areas of your foot and ankle. Your caregiver will try to move your ankle in certain ways. An X-ray exam may be done to be sure a bone was not broken or a ligament did not separate from one of the bones in your ankle (avulsion fracture).  °TREATMENT  °Certain types of braces can help stabilize your ankle. Your caregiver can make a recommendation for this. Your caregiver may recommend the use of medicine for pain. If your sprain is severe, your caregiver may refer you to a surgeon who helps to restore function to parts of your skeletal system (orthopedist) or a physical therapist. °HOME CARE INSTRUCTIONS  °· Apply ice to your injury for 1-2 days or as directed by your caregiver. Applying ice helps to reduce inflammation and pain. °¨ Put ice in a plastic bag. °¨ Place a towel between your skin and the bag. °¨ Leave the ice on for 15-20 minutes at a time, every 2 hours while you are awake. °· Only take over-the-counter or prescription medicines for pain, discomfort, or fever as directed by  your caregiver. °· Elevate your injured ankle above the level of your heart as much as possible for 2-3 days. °· If your caregiver recommends crutches, use them as instructed. Gradually put weight on the affected ankle. Continue to use crutches or a cane until you can walk without feeling pain in your ankle. °· If you have a plaster splint, wear the splint as directed by your caregiver. Do not rest it on anything harder than a pillow for the first 24 hours. Do not put weight on it. Do not get it wet. You may take it off to take a shower or bath. °· You may have been given an elastic bandage to wear around your ankle to provide support. If the elastic bandage is too tight (you have numbness or tingling in your foot or your foot becomes cold and blue), adjust the bandage to make it comfortable. °· If you have an air splint, you may blow more air into it or let air out to make it more comfortable. You may take your splint off at night and before taking a shower or bath. Wiggle your toes in the splint several times per day to decrease swelling. °SEEK MEDICAL CARE IF:  °· You have rapidly increasing bruising or swelling. °· Your toes feel extremely cold or you lose feeling in your foot. °· Your pain is not relieved with medicine. °SEEK IMMEDIATE MEDICAL CARE IF: °· Your toes are numb or blue. °·   You have severe pain that is increasing. MAKE SURE YOU:   Understand these instructions.  Will watch your condition.  Will get help right away if you are not doing well or get worse.   This information is not intended to replace advice given to you by your health care provider. Make sure you discuss any questions you have with your health care provider.   Document Released: 08/13/2005 Document Revised: 09/03/2014 Document Reviewed: 08/25/2011 Elsevier Interactive Patient Education 2016 [employer].   Wear the splint as needed. Take OTC ibuprofen or naproxen for pain and swelling. Follow-up with Dr. Sabra Heck for  continued symptoms.

## 2016-01-30 NOTE — ED Provider Notes (Signed)
[facility] Emergency Department Provider Note ____________________________________________  Time seen: 1457  I have reviewed the triage vital signs and the nursing notes.  HISTORY  Chief Complaint  Foot Pain  HPI Sharon Marquez is a 45 y.o. female presents to the ED for evaluation of pain to her right ankle and foot following a fallat a local businesses morning. She describes walking out of the local store with bradycardia conditions, when she has family tripped rolling her right ankle and falling to the right side. Her primary complaint at this time is pain and swelling to the lateral right foot and ankle. She denies any other injury at this time. She rates her overall discomfort at a 9/10 in triage.  Past Medical History  Diagnosis Date  . Anemia     There are no active problems to display for this patient.   Past Surgical History  Procedure Laterality Date  . Cholecystectomy      Current Outpatient Rx  Name  Route  Sig  Dispense  Refill  . Ferrous Sulfate (SLOW FE) 142 (45 FE) MG TBCR   Oral   Take 1 tablet by mouth 2 (two) times daily.   30 tablet   0   . ibuprofen (ADVIL,MOTRIN) 200 MG tablet   Oral   Take 400 mg by mouth every 6 (six) hours as needed for headache or mild pain.         Marland Kitchen ibuprofen (ADVIL,MOTRIN) 800 MG tablet   Oral   Take 1 tablet (800 mg total) by mouth every 8 (eight) hours as needed. Patient not taking: Reported on 06/01/2015   20 tablet   0   . traMADol (ULTRAM) 50 MG tablet   Oral   Take 1 tablet (50 mg total) by mouth every 6 (six) hours as needed.   20 tablet   0    Allergies Review of patient's allergies indicates no known allergies.  No family history on file.  Social History Social History  Substance Use Topics  . Smoking status: Never Smoker   . Smokeless tobacco: None  . Alcohol Use: No   Review of Systems  Constitutional: Negative for fever. Musculoskeletal: Negative for back pain. Right  foot and ankle pain as above Skin: Negative for rash. Neurological: Negative for headaches, focal weakness or numbness. ____________________________________________  PHYSICAL EXAM:  VITAL SIGNS: ED Triage Vitals  Enc Vitals Group     BP 01/30/16 1346 132/69 mmHg     Pulse Rate 01/30/16 1346 67     Resp 01/30/16 1346 18     Temp 01/30/16 1346 98.9 F (37.2 C)     Temp Source 01/30/16 1346 Oral     SpO2 01/30/16 1346 100 %     Weight 01/30/16 1346 261 lb (118.389 kg)     Height 01/30/16 1346 5\' 3"  (1.6 m)     Head Cir --      Peak Flow --      Pain Score 01/30/16 1348 9     Pain Loc --      Pain Edu? --      Excl. in [place]? --    Constitutional: Alert and oriented. Well appearing and in no distress. Head: Normocephalic and atraumatic. Cardiovascular: Normal distal pulses  Respiratory: Normal respiratory effort.  Musculoskeletal: Right ankle with mild swelling laterally at the malleolus. Patient is tender to palpation along the ligaments at the lateral malleolus. She is able to demonstrate normal flexion and extension range at the  ankle. Foot is unremarkable. No calf or Achilles tenderness is appreciated. Nontender with normal range of motion in all other extremities.  Neurologic:  Normal speech and language. No gross focal neurologic deficits are appreciated. Skin:  Skin is warm, dry and intact. No rash noted. ____________________________________________   RADIOLOGY  Right Foot IMPRESSION: No fracture or malalignment in the right foot.  Right Ankle  IMPRESSION: Mild diffuse right ankle soft tissue swelling, with no fracture or subluxation. ____________________________________________  PROCEDURES  Ankle velcro stirrup splint ____________________________________________  INITIAL IMPRESSION / ASSESSMENT AND PLAN / ED COURSE  Patient with right ankle sprain without evidence of fracture dislocation. She is fitted with an ankle stirrup splint and provided with a work note  which will allow for work in an area that does not require a safety shoe for the remainder this week. She is to dose over-the-counter ibuprofen or Aleve for pain and inflammation relief. She will follow-up with primary care provider for ongoing symptom management. ____________________________________________  FINAL CLINICAL IMPRESSION(S) / ED DIAGNOSES  Final diagnoses:  Ankle sprain, right, initial encounter     Melvenia Needles, PA-C 01/30/16 [place], MD 01/31/16 2157

## 2016-01-30 NOTE — ED Notes (Signed)
Foot and ankle pain since fell just prior to arrival.

## 2016-06-14 IMAGING — CR DG CHEST 2V
2 series · 2 of 2 positions shown · non-contrast
Comparison: 08/10/2013

CLINICAL DATA: Chest pain and shortness of breath beginning this
morning.

EXAM:
CHEST  2 VIEW

[chest pa]
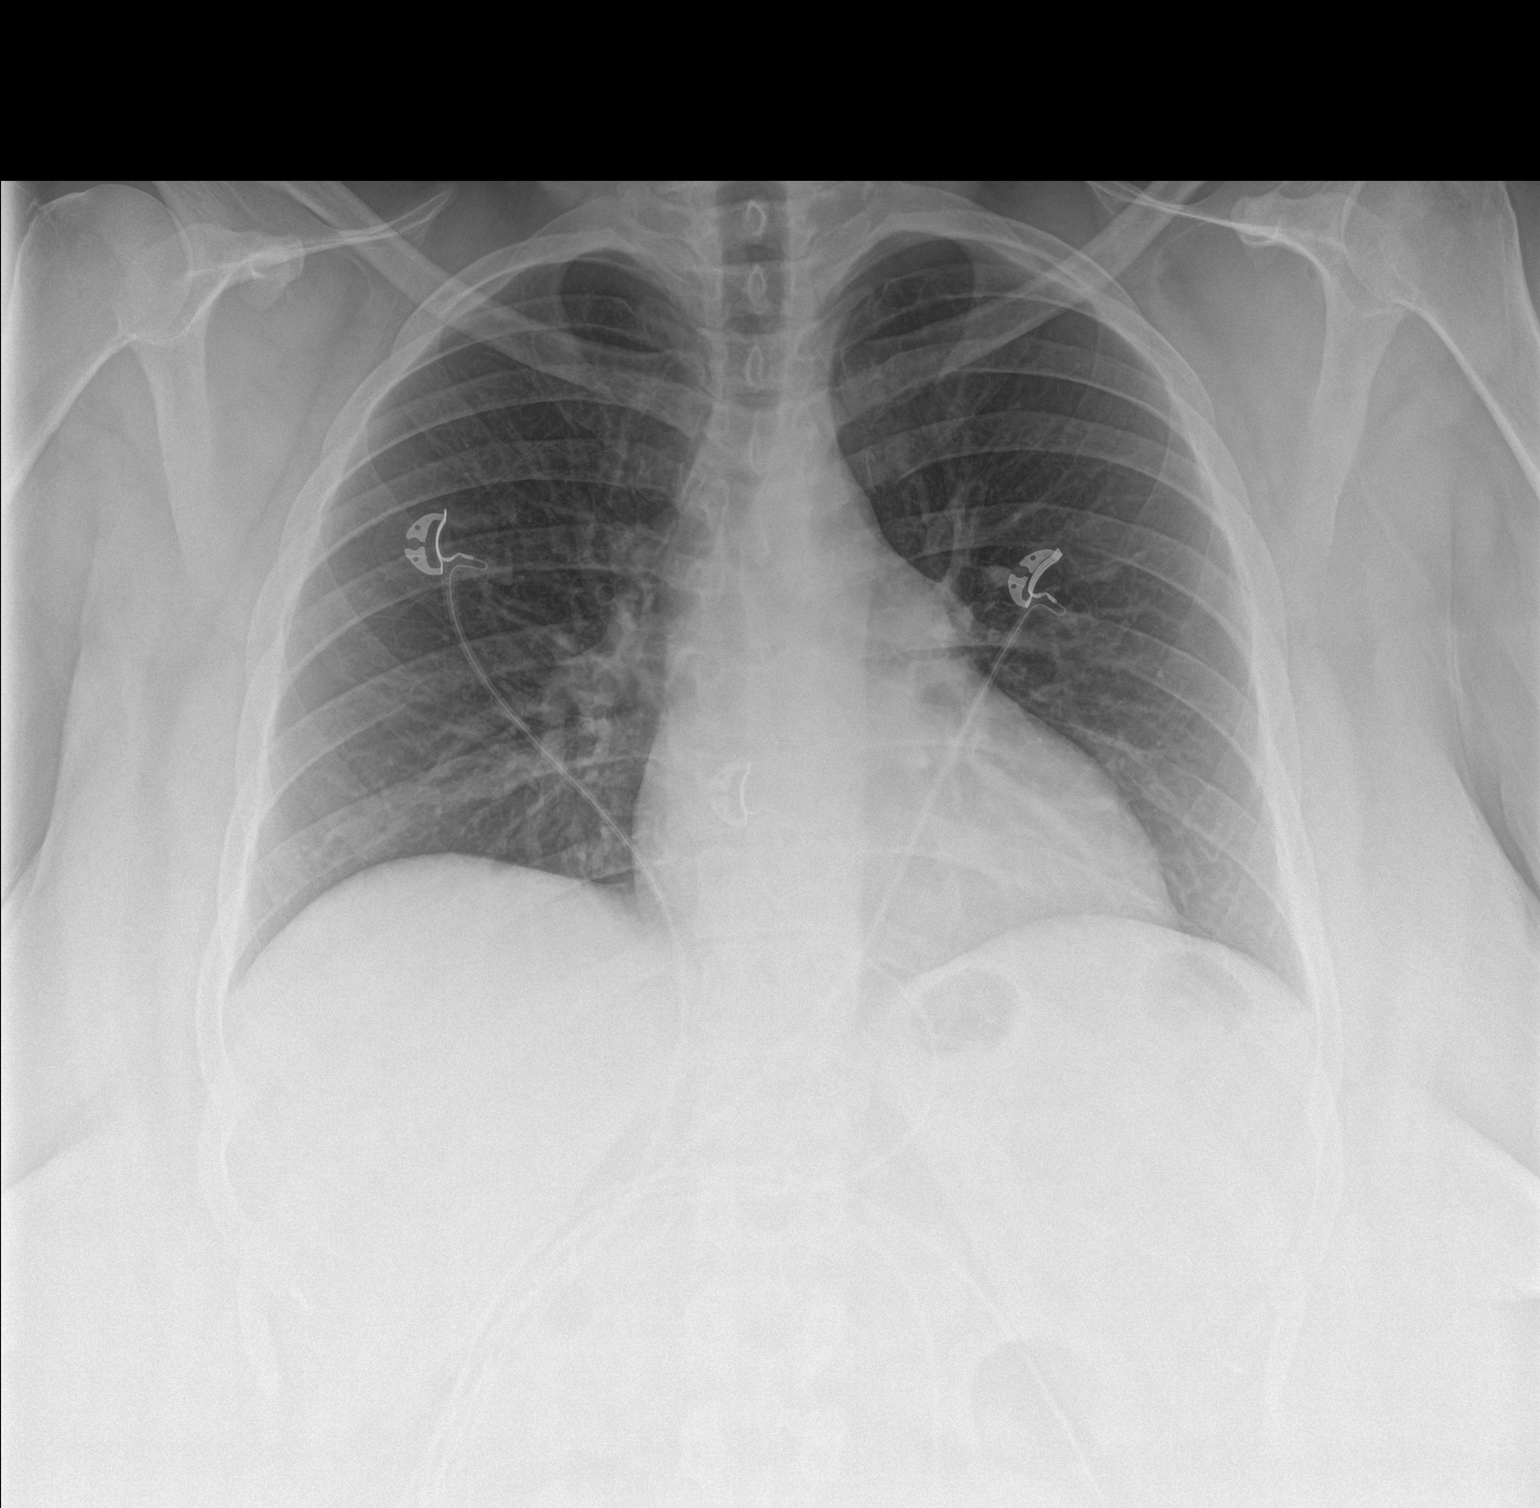

[chest lat]
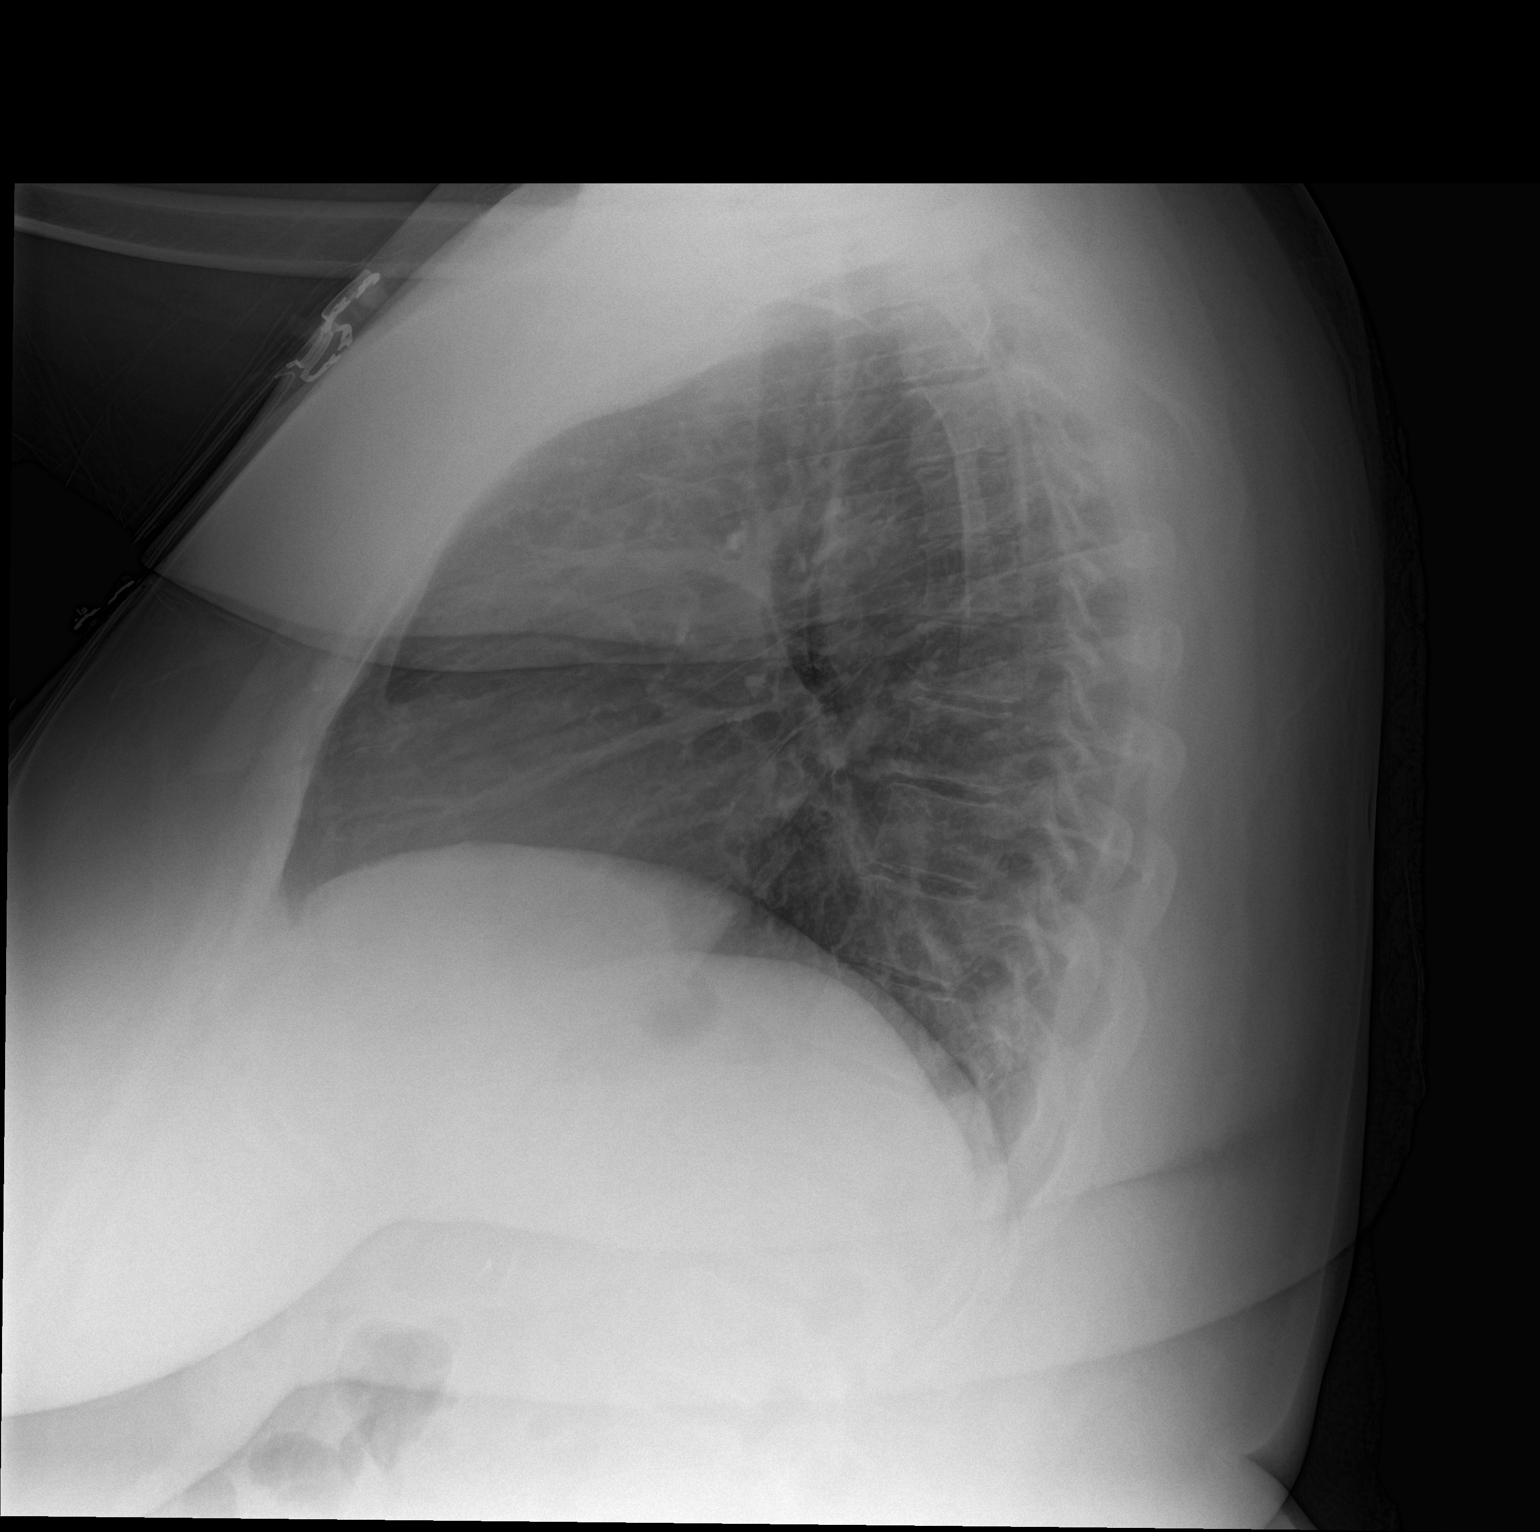

[2 of 2 positions shown; findings below may reference images not displayed]

FINDINGS: The heart size and mediastinal contours are within normal limits.
Both lungs are clear. No evidence of pleural effusion or
pneumothorax. The visualized skeletal structures are unremarkable.
IMPRESSION: Negative.  No active cardiopulmonary disease.

## 2016-07-11 ENCOUNTER — Observation Stay
Admission: EM | Admit: 2016-07-11 | Discharge: 2016-07-12 | Disposition: A | Discharge status: Home / Self Care | Payer: Self-pay | Attending: Obstetrics and Gynecology | Admitting: Obstetrics and Gynecology

## 2016-07-11 DIAGNOSIS — Z8349 Family history of other endocrine, nutritional and metabolic diseases: Secondary | ICD-10-CM | POA: Insufficient documentation

## 2016-07-11 DIAGNOSIS — N920 Excessive and frequent menstruation with regular cycle: Secondary | ICD-10-CM

## 2016-07-11 DIAGNOSIS — N938 Other specified abnormal uterine and vaginal bleeding: Secondary | ICD-10-CM | POA: Insufficient documentation

## 2016-07-11 DIAGNOSIS — D509 Iron deficiency anemia, unspecified: Secondary | ICD-10-CM | POA: Diagnosis present

## 2016-07-11 DIAGNOSIS — N939 Abnormal uterine and vaginal bleeding, unspecified: Secondary | ICD-10-CM

## 2016-07-11 DIAGNOSIS — R55 Syncope and collapse: Secondary | ICD-10-CM

## 2016-07-11 DIAGNOSIS — D259 Leiomyoma of uterus, unspecified: Secondary | ICD-10-CM | POA: Insufficient documentation

## 2016-07-11 DIAGNOSIS — Z9049 Acquired absence of other specified parts of digestive tract: Secondary | ICD-10-CM | POA: Insufficient documentation

## 2016-07-11 DIAGNOSIS — D649 Anemia, unspecified: Secondary | ICD-10-CM

## 2016-07-11 DIAGNOSIS — Z833 Family history of diabetes mellitus: Secondary | ICD-10-CM | POA: Insufficient documentation

## 2016-07-11 HISTORY — DX: Leiomyoma of uterus, unspecified: D25.9

## 2016-07-11 HISTORY — DX: Encounter for other specified aftercare: Z51.89

## 2016-07-11 HISTORY — DX: Personal history of other medical treatment: Z92.89

## 2016-07-11 LAB — BASIC METABOLIC PANEL
Anion gap: 7 (ref 5–15)
BUN: 13 mg/dL (ref 6–20)
CHLORIDE: 108 mmol/L (ref 101–111)
CO2: 25 mmol/L (ref 22–32)
Calcium: 8.6 mg/dL — ABNORMAL LOW (ref 8.9–10.3)
Creatinine, Ser: 0.55 mg/dL (ref 0.44–1.00)
GFR calc Af Amer: >60 mL/min (ref >60)
GFR calc non Af Amer: >60 mL/min (ref >60)
GLUCOSE: 116 mg/dL — AB (ref 65–99)
POTASSIUM: 3.1 mmol/L — AB (ref 3.5–5.1)
SODIUM: 140 mmol/L (ref 135–145)

## 2016-07-11 LAB — CBC
HEMATOCRIT: 20 % — AB (ref 35.0–47.0)
Hemoglobin: 5.8 g/dL — ABNORMAL LOW (ref 12.0–16.0)
MCH: 20.8 pg — AB (ref 26.0–34.0)
MCHC: 29.2 g/dL — ABNORMAL LOW (ref 32.0–36.0)
MCV: 71.3 fL — AB (ref 80.0–100.0)
Platelets: 729 10*3/uL — ABNORMAL HIGH (ref 150–440)
RBC: 2.8 MIL/uL — ABNORMAL LOW (ref 3.80–5.20)
RDW: 33.7 % — AB (ref 11.5–14.5)
WBC: 4.2 10*3/uL (ref 3.6–11.0)

## 2016-07-11 LAB — PREPARE RBC (CROSSMATCH)

## 2016-07-11 MED ORDER — ACETAMINOPHEN 500 MG PO TABS
1000.0000 mg | ORAL_TABLET | Freq: Once | ORAL | Status: AC
  Administered 2016-07-11: 1000 mg via ORAL
  Filled 2016-07-11: qty 2

## 2016-07-11 MED ORDER — ZOLPIDEM TARTRATE 5 MG PO TABS: 5.0000 mg | ORAL_TABLET | Freq: Every evening | ORAL | Status: DC | PRN

## 2016-07-11 MED ORDER — FUROSEMIDE 10 MG/ML IJ SOLN
20.0000 mg | Freq: Once | INTRAMUSCULAR | Status: AC
  Administered 2016-07-11: 20 mg via INTRAVENOUS
  Filled 2016-07-11: qty 2

## 2016-07-11 MED ORDER — MAGNESIUM HYDROXIDE 400 MG/5ML PO SUSP: 30.0000 mL | Freq: Every day | ORAL | Status: DC | PRN

## 2016-07-11 MED ORDER — LACTATED RINGERS IV SOLN
INTRAVENOUS | Status: DC
  Administered 2016-07-12: 07:00:00 via INTRAVENOUS

## 2016-07-11 MED ORDER — ALUM & MAG HYDROXIDE-SIMETH 200-200-20 MG/5ML PO SUSP: 30.0000 mL | ORAL | Status: DC | PRN

## 2016-07-11 MED ORDER — DIPHENHYDRAMINE HCL 25 MG PO CAPS
25.0000 mg | ORAL_CAPSULE | Freq: Once | ORAL | Status: AC
  Administered 2016-07-11: 25 mg via ORAL
  Filled 2016-07-11: qty 1

## 2016-07-11 MED ORDER — SODIUM CHLORIDE 0.9 % IV SOLN
Freq: Once | INTRAVENOUS | Status: AC
  Administered 2016-07-11: 23:00:00 via INTRAVENOUS

## 2016-07-11 MED ORDER — DOCUSATE SODIUM 100 MG PO CAPS
100.0000 mg | ORAL_CAPSULE | Freq: Two times a day (BID) | ORAL | Status: DC
  Administered 2016-07-12: 100 mg via ORAL
  Filled 2016-07-11: qty 1

## 2016-07-11 MED ORDER — ONDANSETRON HCL 4 MG PO TABS: 4.0000 mg | ORAL_TABLET | Freq: Four times a day (QID) | ORAL | Status: DC | PRN

## 2016-07-11 MED ORDER — MAGNESIUM CITRATE PO SOLN
1.0000 | Freq: Once | ORAL | Status: DC | PRN
  Filled 2016-07-11: qty 296

## 2016-07-11 MED ORDER — ONDANSETRON HCL 4 MG/2ML IJ SOLN: 4.0000 mg | Freq: Four times a day (QID) | INTRAMUSCULAR | Status: DC | PRN

## 2016-07-11 MED ORDER — BISACODYL 5 MG PO TBEC: 5.0000 mg | DELAYED_RELEASE_TABLET | Freq: Every day | ORAL | Status: DC | PRN

## 2016-07-11 MED ORDER — IBUPROFEN 600 MG PO TABS: 600.0000 mg | ORAL_TABLET | Freq: Four times a day (QID) | ORAL | Status: DC | PRN

## 2016-07-11 NOTE — ED Provider Notes (Signed)
[facility] Emergency Department Provider Note  Time seen: 5:52 PM  I have reviewed the triage vital signs and the nursing notes.   HISTORY  Chief Complaint Fatigue (possible anemia)    HPI Sharon Marquez is a 45 y.o. female with a past medical history of menorrhagia, anemia, presents to the emergency department for lightheadedness, dizziness and near syncope. According to the patient for the past several days she has been feeling very lightheaded and dizzy. States it feels like her blood levels are down. Today she states she almost passed out because of the lightheadedness that she came to the emergency department for evaluation. Denies any abdominal pain, black or bloody stool. Has history of heavy periods, has been recommended to have a hysterectomy in the past. Patient was supposed to follow-up with OB after her last ER visit in 2016 seen for the same, she states she never followed up with an OB because she does not want a hysterectomy. Patient states she is taking iron supplements regularly for the past 1 year.  Past Medical History:  Diagnosis Date  . Anemia     There are no active problems to display for this patient.   Past Surgical History:  Procedure Laterality Date  . CHOLECYSTECTOMY      Prior to Admission medications   Medication Sig Start Date End Date Taking? Authorizing Provider  Ferrous Sulfate (SLOW FE) 142 (45 FE) MG TBCR Take 1 tablet by mouth 2 (two) times daily. 06/01/15   Ahmed Prima, MD  ibuprofen (ADVIL,MOTRIN) 200 MG tablet Take 400 mg by mouth every 6 (six) hours as needed for headache or mild pain.    Historical Provider, MD  ibuprofen (ADVIL,MOTRIN) 800 MG tablet Take 1 tablet (800 mg total) by mouth every 8 (eight) hours as needed. Patient not taking: Reported on 06/01/2015 03/08/15   Nance Pear, MD    No Known Allergies  No family history on file.  Social History Social History  Substance Use Topics  . Smoking  status: Never Smoker  . Smokeless tobacco: Never Used  . Alcohol use No    Review of Systems Constitutional: Negative for fever. Cardiovascular: Negative for chest pain. Respiratory: Negative for shortness of breath. Gastrointestinal: Negative for abdominal pain Genitourinary: Negative for dysuria.Positive for heavy periods, currently on day 3 of her period. Musculoskeletal: Negative for back pain Neurological: Negative for headache 10-point ROS otherwise negative.  ____________________________________________   PHYSICAL EXAM:  VITAL SIGNS: ED Triage Vitals  Enc Vitals Group     BP 07/11/16 1643 125/68     Pulse Rate 07/11/16 1643 66     Resp 07/11/16 1643 17     Temp 07/11/16 1643 98 F (36.7 C)     Temp Source 07/11/16 1643 Oral     SpO2 07/11/16 1643 100 %     Weight 07/11/16 1643 252 lb (114.3 kg)     Height 07/11/16 1643 5\' 2"  (1.575 m)     Head Circumference --      Peak Flow --      Pain Score 07/11/16 1751 8     Pain Loc --      Pain Edu? --      Excl. in [place]? --     Constitutional: Alert and oriented. Well appearing and in no distress. Eyes: Normal exam ENT   Head: Normocephalic and atraumatic.   Mouth/Throat: Mucous membranes are moist. Cardiovascular: Normal rate, regular rhythm. No murmur Respiratory: Normal respiratory effort without tachypnea  nor retractions. Breath sounds are clear  Gastrointestinal: Soft and nontender. No distention.   Musculoskeletal: Nontender with normal range of motion in all extremities.  Neurologic:  Normal speech and language. No gross focal neurologic deficits  Skin:  Skin is warm, dry and intact.  Psychiatric: Mood and affect are normal.   ____________________________________________    INITIAL IMPRESSION / ASSESSMENT AND PLAN / ED COURSE  Pertinent labs & imaging results that were available during my care of the patient were reviewed by me and considered in my medical decision making (see chart for  details).  The patient presents the emergency department near syncope, has a history of anemia due to menorrhagia. Patient states she has not followed up with OB since I saw her last in 2016 for the same. Patient's hemoglobin today is 5.8. As she is symptomatic she will need to be transfused. We will discuss with the on-call OB/GYN for further recommendations.  As the patient is symptomatically anemic with near syncope we will admit to OB/GYN. I discussed with Dr. Marcelline Mates will be admitting the patient for transfusion and to discuss further treatment options.  ____________________________________________   FINAL CLINICAL IMPRESSION(S) / ED DIAGNOSES  Symptomatic anemia Menorrhagia   Harvest Dark, MD 07/11/16 1820

## 2016-07-11 NOTE — ED Triage Notes (Signed)
Pt c/o generalized fatigue for the past 2 weeks , states she believes it is related to anemia, states she has a hx of anemia with transfusions

## 2016-07-11 NOTE — ED Notes (Signed)
ED Provider at bedside. 

## 2016-07-11 NOTE — ED Notes (Addendum)
Pt lying in bed, able to move all limbs on command.  Denies any incr lethargy, dizziness or SOB.  NAD

## 2016-07-11 NOTE — H&P (Addendum)
Reason for Consult: Symptomatic anemia Referring Physician: Harvest Dark, MD (ER Physician)  Sharon Marquez is a 45 y.o. G1P1001 female with LMP 07/10/2016, who is being admitted for symptomatic anemia. Patient presented to the Emergency Room this evening with complaints of lightheadedness, dizziness and near syncope.  Notes lightheadedness and dizziness have been ongoing for the past several days, however near syncopal episode occurred today.  Patient has a h/o menorrhagia and anemia, requiring almost yearly admissions for blood transfusions since 2014. Was recommended by OB/GYN who s nose is lightly 101, the support aw her in 2014 that she needed a hysterectomy, however patient has been hesitant.  Has not had any follow-up with a GYN since that time outside of hospital admissions.  Denies chest pain, SOB.  Does note some dyspnea on exertion.  Of note, patient does note a h/o uterine fibroids (dx in 2014 on ultrasound during admission).   Pertinent Gynecological History: Menses: irregular occurring approximately every 14-28 days without intermenstrual spotting Bleeding: dysfunctional uterine bleeding Contraception: none DES exposure: denies Blood transfusions: h/o blood transfusion in 2014, 2015, and 2016 Sexually transmitted diseases: no past history Previous GYN Procedures: None  Last mammogram: Patient has never had a mammogram. Last pap: unsure of last pap smear OB History: SVD x 1   Menstrual History: Menarche age: 14 No LMP recorded.    Past Medical History:  Diagnosis Date  . Anemia   . Blood transfusion without reported diagnosis   . Fibroid uterus 2014  . History of blood transfusion 2014, 2015   for symptomatic anemia    Past Surgical History:  Procedure Laterality Date  . CHOLECYSTECTOMY      Family History  Problem Relation Age of Onset  . Hyperthyroidism Mother   . Hyperthyroidism Maternal Aunt   . Diabetes Maternal Grandmother      Social History    Social History  . Marital status: Single    Spouse name: N/A  . Number of children: N/A  . Years of education: N/A   Occupational History  . Not on file.   Social History Main Topics  . Smoking status: Never Smoker  . Smokeless tobacco: Never Used  . Alcohol use No  . Drug use: Unknown  . Sexual activity: Not on file   Other Topics Concern  . Not on file   Social History Narrative  . No narrative on file    Allergies: No Known Allergies  Medications: Prior to Admission:  (Not in a hospital admission)  Review of Systems  Constitutional: Negative for chills, fever and weight loss.  HENT: Negative for congestion, sore throat and tinnitus.   Eyes: Negative for blurred vision and discharge.  Respiratory: Negative for cough.   Cardiovascular: Negative for chest pain and palpitations.  Gastrointestinal: Negative for abdominal pain, blood in stool, constipation, diarrhea, heartburn, nausea and vomiting.  Genitourinary: Negative for dysuria, frequency, hematuria and urgency.  Musculoskeletal: Negative for back pain, myalgias and neck pain.  Skin: Negative for itching and rash.  Neurological: Positive for dizziness. Negative for tingling, loss of consciousness and headaches.       And lightheadedness  Psychiatric/Behavioral: Negative for depression, substance abuse and suicidal ideas.    Blood pressure 125/68, pulse 66, temperature 98 F (36.7 C), temperature source Oral, resp. rate 17, height '5\' 2"'$  (1.575 m), weight 252 lb (114.3 kg), SpO2 100 %. Physical Exam  Constitutional: She is oriented to person, place, and time. She appears well-developed and well-nourished. No distress.  HENT:  Head: Normocephalic and atraumatic.  Mouth/Throat: Oropharynx is clear and moist. No oropharyngeal exudate.  Eyes: EOM are normal. Pupils are equal, round, and reactive to light. Right eye exhibits no discharge. Left eye exhibits no discharge. No scleral icterus.  Neck: Normal range of  motion. Neck supple. No JVD present. No tracheal deviation present. No thyromegaly present.  Cardiovascular: Regular rhythm and normal heart sounds.  Exam reveals no gallop and no friction rub.   No murmur heard. Respiratory: Effort normal and breath sounds normal. No respiratory distress. She has no wheezes. She has no rales.  GI: Soft. Bowel sounds are normal. She exhibits no distension and no mass. There is no tenderness. There is no rebound and no guarding.  Genitourinary: Vagina normal. No vaginal discharge found.  Genitourinary Comments: Dark red blood in vaginal vault.  Uterine enlargement present, ~ 12-14 week size but difficult to assess secondary to body habitus.   Musculoskeletal: Normal range of motion. She exhibits no edema, tenderness or deformity.  Lymphadenopathy:    She has no cervical adenopathy.  Neurological: She is alert and oriented to person, place, and time.  Skin: Skin is warm and dry. She is not diaphoretic. No erythema. There is pallor.  Psychiatric: She has a normal mood and affect. Her behavior is normal. Judgment and thought content normal.    Results for orders placed or performed during the hospital encounter of 07/11/16 (from the past 48 hour(s))  Basic metabolic panel     Status: Abnormal   Collection Time: 07/11/16  4:41 PM  Result Value Ref Range   Sodium 140 135 - 145 mmol/L   Potassium 3.1 (L) 3.5 - 5.1 mmol/L   Chloride 108 101 - 111 mmol/L   CO2 25 22 - 32 mmol/L   Glucose, Bld 116 (H) 65 - 99 mg/dL   BUN 13 6 - 20 mg/dL   Creatinine, Ser 0.55 0.44 - 1.00 mg/dL   Calcium 8.6 (L) 8.9 - 10.3 mg/dL   GFR calc non Af Amer >60 >60 mL/min   GFR calc Af Amer >60 >60 mL/min    Comment: (NOTE) The eGFR has been calculated using the CKD EPI equation. This calculation has not been validated in all clinical situations. eGFR's persistently <60 mL/min signify possible Chronic Kidney Disease.    Anion gap 7 5 - 15  CBC     Status: Abnormal   Collection  Time: 07/11/16  4:41 PM  Result Value Ref Range   WBC 4.2 3.6 - 11.0 K/uL   RBC 2.80 (L) 3.80 - 5.20 MIL/uL   Hemoglobin 5.8 (L) 12.0 - 16.0 g/dL   HCT 20.0 (L) 35.0 - 47.0 %   MCV 71.3 (L) 80.0 - 100.0 fL   MCH 20.8 (L) 26.0 - 34.0 pg   MCHC 29.2 (L) 32.0 - 36.0 g/dL   RDW 33.7 (H) 11.5 - 14.5 %   Platelets 729 (H) 150 - 440 K/uL  Type and screen [facility]     Status: None   Collection Time: 07/11/16  4:47 PM  Result Value Ref Range   ABO/RH(D) O POS    Antibody Screen NEG    Sample Expiration 07/14/2016     No results found.  Assessment/Plan: 1. Symptomatic anemia - patient with long-standing history of symptomatic anemia due to abnormal bleeding.  Currently not experiencing any heavy bleeding.  Ordered blood transfusion of 3 units PRBCs.  Also will continue ferrous sulfate BID as outpatient.  Discussion had on management  of of abnormal bleeding (see below). Continue cardiac monitoring.   2. Abnormal uterine bleeding (menorrhagia) - recommendations have previously been made for hysterectomy by prior OB/GYN, however patient has been reluctant to do so.  Discussed all options for management of menorrhagia. Patient unsure currently. Patient will need further evaluation with pap smear and endometrial biopsy prior to initiation of any long-term management or surgery. Will start on Provera 10 mg tablets while inpatient.   3. Fibroid uterus - patient with history of fibroid uterus diagnosed on ultrasound performed during 2014 admission for symptomatic anemia. At the time all there was a single fibroid that was approximately 5 cm. We'll order ultrasound to assess for any changes.   Rubie Maid, MD [facility] 07/11/2016  7:31 PM

## 2016-07-12 ENCOUNTER — Encounter: Payer: Self-pay | Admitting: Obstetrics and Gynecology

## 2016-07-12 ENCOUNTER — Observation Stay: Payer: Self-pay

## 2016-07-12 LAB — T4, FREE: FREE T4: 0.91 ng/dL (ref 0.61–1.12)

## 2016-07-12 LAB — HEMOGLOBIN AND HEMATOCRIT, BLOOD
HEMATOCRIT: 28.2 % — AB (ref 35.0–47.0)
Hemoglobin: 8.9 g/dL — ABNORMAL LOW (ref 12.0–16.0)

## 2016-07-12 LAB — TSH: TSH: 4.726 u[IU]/mL — ABNORMAL HIGH (ref 0.350–4.500)

## 2016-07-12 LAB — RAPID HIV SCREEN (HIV 1/2 AB+AG)
HIV 1/2 ANTIBODIES: NONREACTIVE
HIV-1 P24 ANTIGEN - HIV24: NONREACTIVE

## 2016-07-12 MED ORDER — MEDROXYPROGESTERONE ACETATE 10 MG PO TABS
10.0000 mg | ORAL_TABLET | Freq: Every day | ORAL | Status: DC
  Administered 2016-07-12: 10 mg via ORAL
  Filled 2016-07-12: qty 1

## 2016-07-12 MED ORDER — MEDROXYPROGESTERONE ACETATE 10 MG PO TABS: 10.0000 mg | ORAL_TABLET | Freq: Every day | ORAL | 0 refills | Status: DC

## 2016-07-12 MED ORDER — FERROUS SULFATE ER 142 (45 FE) MG PO TBCR: 1.0000 | EXTENDED_RELEASE_TABLET | Freq: Three times a day (TID) | ORAL | 6 refills | Status: DC

## 2016-07-12 MED ORDER — DOCUSATE SODIUM 100 MG PO CAPS: 100.0000 mg | ORAL_CAPSULE | Freq: Every day | ORAL | 3 refills | Status: DC | PRN

## 2016-07-12 NOTE — Progress Notes (Signed)
  July 12, 2016  Patient: Sharon Marquez  Date of Birth: 1970/09/04  Date of Visit: 07/11/2016    To Whom It May Concern:  Sharon Marquez was seen and treated in our [facility] on 07/11/2016-07/12/16. [name]  may return to work on Monday 07/16/16.  Sincerely,  Orie Rout, RN

## 2016-07-12 NOTE — Progress Notes (Signed)
Pt to Ultrasound via wheelchair.

## 2016-07-12 NOTE — Progress Notes (Signed)
Pt discharged home.  Discharge instructions, prescriptions and follow up appointment given to and reviewed with pt.  Pt verbalized understanding.  Escorted by auxillary. 

## 2016-07-12 NOTE — Discharge Summary (Signed)
Physician Discharge Summary   Patient ID: Sharon Marquez [id number] y.o. 05-19-1971  Admit date: 07/11/2016  Discharge date and time: 07/12/2016  5:57 PM   Admitting Physician: Rubie Maid, MD   Discharge Physician: Rubie Maid, MD  Admission Diagnoses: Near syncope [R55] Menorrhagia with regular cycle [N92.0] Symptomatic anemia [D64.9] Fibroid uterus  Discharge Diagnoses: Same  Admission Condition: fair  Discharged Condition: good  Indication for Admission: Symptomatic anemia, menorrhagia with regular cycle  Hospital Course: The patient was admitted from the emergency room due to complaints of symptomatic anemia and heavy vaginal bleeding. The patient was transfused a total of 3 units of PRBCs, and started on Provera 10 mg tablets daily. Status post transfusion patient's symptoms had resolved, her hemoglobin had increased from a starting hemoglobin of 5.4 up to 8.9. Her bleeding had slowed some on the Provera tablets. The patient was discharged home.  Consults: None  Significant Diagnostic Studies: labs:  and radiology: Ultrasound: Results for orders placed or performed during the hospital encounter of [id number]  Basic metabolic panel  Result Value Ref Range   Sodium 140 135 - 145 mmol/L   Potassium 3.1 (L) 3.5 - 5.1 mmol/L   Chloride 108 101 - 111 mmol/L   CO2 25 22 - 32 mmol/L   Glucose, Bld 116 (H) 65 - 99 mg/dL   BUN 13 6 - 20 mg/dL   Creatinine, Ser 0.55 0.44 - 1.00 mg/dL   Calcium 8.6 (L) 8.9 - 10.3 mg/dL   GFR calc non Af Amer >60 >60 mL/min   GFR calc Af Amer >60 >60 mL/min   Anion gap 7 5 - 15  CBC  Result Value Ref Range   WBC 4.2 3.6 - 11.0 K/uL   RBC 2.80 (L) 3.80 - 5.20 MIL/uL   Hemoglobin 5.8 (L) 12.0 - 16.0 g/dL   HCT 20.0 (L) 35.0 - 47.0 %   MCV 71.3 (L) 80.0 - 100.0 fL   MCH 20.8 (L) 26.0 - 34.0 pg   MCHC 29.2 (L) 32.0 - 36.0 g/dL   RDW 33.7 (H) 11.5 - 14.5 %   Platelets 729 (H) 150 - 440 K/uL  Rapid HIV screen (HIV 1/2 Ab+Ag)  Result  Value Ref Range   HIV-1 P24 Antigen - HIV24 NON REACTIVE NON REACTIVE   HIV 1/2 Antibodies NON REACTIVE NON REACTIVE   Interpretation (HIV Ag Ab)      A non reactive test result means that HIV 1 or HIV 2 antibodies and HIV 1 p24 antigen were not detected in the specimen.  TSH  Result Value Ref Range   TSH 4.726 (H) 0.350 - 4.500 uIU/mL  Hepatitis B surface antigen  Result Value Ref Range   Hepatitis B Surface Ag Negative Negative  RPR  Result Value Ref Range   RPR Ser Ql Non Reactive Non Reactive  Hemoglobin and hematocrit, blood  Result Value Ref Range   Hemoglobin 8.9 (L) 12.0 - 16.0 g/dL   HCT 28.2 (L) 35.0 - 47.0 %  T4, free  Result Value Ref Range   Free T4 0.91 0.61 - 1.12 ng/dL  T3  Result Value Ref Range   T3, Total 123 71 - 180 ng/dL  T3 uptake  Result Value Ref Range   T3 Uptake Ratio 31 24 - 39 %  Type and screen [facility]  Result Value Ref Range   ABO/RH(D) O POS    Antibody Screen NEG    Sample Expiration 07/14/2016    Unit  Number [id number]    Blood Component Type RBC LR PHER2    Unit division 00    Status of Unit ISSUED,FINAL    Transfusion Status OK TO TRANSFUSE    Crossmatch Result Compatible    Unit Number [id number]    Blood Component Type RED CELLS,LR    Unit division 00    Status of Unit ISSUED,FINAL    Transfusion Status OK TO TRANSFUSE    Crossmatch Result Compatible    Unit Number [id number]    Blood Component Type RED CELLS,LR    Unit division 00    Status of Unit ISSUED,FINAL    Transfusion Status OK TO TRANSFUSE    Crossmatch Result Compatible   Prepare RBC  Result Value Ref Range   Order Confirmation ORDER PROCESSED BY BLOOD BANK     Imaging (Pelvic Ultrasound 07/12/2016):  CLINICAL DATA: Patient with abnormal uterine bleeding.  EXAM: TRANSABDOMINAL AND TRANSVAGINAL ULTRASOUND OF PELVIS  TECHNIQUE: Both transabdominal and transvaginal ultrasound examinations of the pelvis were performed.  Transabdominal technique was performed for global imaging of the pelvis including uterus, ovaries, adnexal regions, and pelvic cul-de-sac. It was necessary to proceed with endovaginal exam following the transabdominal exam to visualize the endometrium.  COMPARISON: Pelvic ultrasound 06/05/2013  FINDINGS: Uterus  Measurements: 6.9 x 3.8 x 5.1 cm. Retroverted. There is a 5.8 x 4.8 x 5.0 cm fibroid within the right aspect of the uterine body with a large submucosal component. Additionally there is a 1.1 x 0.8 x 1.2 cm intramural fibroid with the uterine fundus. There is an additional adjacent 0.6 x 0.7 x 0.8 cm intramural fibroid within the uterine fundus.  Endometrium  Thickness: 3 mm. No focal abnormality visualized.  Right ovary  Not visualized.  Left ovary  Measurements: 3.1 x 2.1 x 1.9 cm. Normal appearance/no adnexal mass.  Other findings  No abnormal free fluid.  IMPRESSION: Large fibroid within the right aspect of the uterine body with a significant submucosal component which appears to exert mass effect and displace the endometrium. This may represent the causative etiology for uterine bleeding.  Endometrium measures 3 mm. If bleeding remains unresponsive to hormonal or medical therapy, sonohysterogram should be considered for focal lesion work-up. (Ref: Radiological Reasoning: Algorithmic Workup of Abnormal Vaginal Bleeding with Endovaginal Sonography and Sonohysterography. AJR 2008; [id number])   Treatments: IV hydration and blood transfusion (3 units PRBCs) and progestin  Discharge Exam: Vitals:   07/12/16 0727 07/12/16 1239  BP: 115/65 120/62  Pulse: (!) 59 60  Resp: 18 20  Temp: 98.5 F (36.9 C) 98.6 F (37 C)   Body mass index is 46.09 kg/m.  General: alert, no distress and morbidly obese Resp: clear to auscultation bilaterally Cardio: regular rate and rhythm, S1, S2 normal, no murmur, click, rub or gallop GI: soft, non-tender; bowel  sounds normal; no masses,  no organomegaly Extremities: extremities normal, atraumatic, no cyanosis or edema Vaginal Bleeding: moderate   Disposition: 01-Home or Self Care  Patient Instructions:    Medication List    STOP taking these medications   naproxen 500 MG tablet Commonly known as:  NAPROSYN     TAKE these medications   docusate sodium 100 MG capsule Commonly known as:  COLACE Take 1 capsule (100 mg total) by mouth daily as needed for mild constipation.   Ferrous Sulfate 142 (45 Fe) MG Tbcr Commonly known as:  SLOW FE Take 1 tablet by mouth 3 (three) times daily. What changed:  when to take this  ibuprofen 200 MG tablet Commonly known as:  ADVIL,MOTRIN Take 200 mg by mouth every 6 (six) hours as needed for headache or mild pain.   medroxyPROGESTERone 10 MG tablet Commonly known as:  PROVERA Take 1 tablet (10 mg total) by mouth daily. Start taking on:  07/13/2016      Activity: activity as tolerated Diet: regular diet Wound Care: none needed  Follow-up with Dr. Marcelline Mates at [facility] in 2-3 weeks.  Signed: .Rubie Maid, MD [facility] 07/12/2016 5:35 PM

## 2016-07-12 NOTE — Progress Notes (Addendum)
.Subjective: Patient reports no major complaints. Notes that menstrual cycle is moderate flow, denies heavy bleeding or passage of large clots. Notes dizziness and lightheadedness have resolved.   Objective: I have reviewed patient's vital signs, intake and output, medications, labs and radiology results.  Vitals:   07/12/16 0727 07/12/16 1239  BP: 115/65 120/62  Pulse: (!) 59 60  Resp: 18 20  Temp: 98.5 F (36.9 C) 98.6 F (37 C)   Body mass index is 46.09 kg/m.  General: alert, no distress and morbidly obese Resp: clear to auscultation bilaterally Cardio: regular rate and rhythm, S1, S2 normal, no murmur, click, rub or gallop GI: soft, non-tender; bowel sounds normal; no masses,  no organomegaly Extremities: extremities normal, atraumatic, no cyanosis or edema Vaginal Bleeding: moderate   Labs:   CBC Latest Ref Rng & Units 07/12/2016 07/11/2016 06/01/2015  WBC 3.6 - 11.0 K/uL - 4.2 3.9  Hemoglobin 12.0 - 16.0 g/dL 8.9(L) 5.8(L) 5.2(L)  Hematocrit 35.0 - 47.0 % 28.2(L) 20.0(L) 19.0(L)  Platelets 150 - 440 K/uL - 729(H) 893(H)    Results for orders placed or performed during the hospital encounter of 07/11/16  Rapid HIV screen (HIV 1/2 Ab+Ag)  Result Value Ref Range   HIV-1 P24 Antigen - HIV24 NON REACTIVE NON REACTIVE   HIV 1/2 Antibodies NON REACTIVE NON REACTIVE   Interpretation (HIV Ag Ab)      A non reactive test result means that HIV 1 or HIV 2 antibodies and HIV 1 p24 antigen were not detected in the specimen.  TSH  Result Value Ref Range   TSH 4.726 (H) 0.350 - 4.500 uIU/mL  T4, free  Result Value Ref Range   Free T4 0.91 0.61 - 1.12 ng/dL  Type and screen [place]     Imaging (Pelvic Ultrasound 07/12/2016):  CLINICAL DATA:  Patient with abnormal uterine bleeding.  EXAM: TRANSABDOMINAL AND TRANSVAGINAL ULTRASOUND OF PELVIS  TECHNIQUE: Both transabdominal and transvaginal ultrasound examinations of the pelvis were performed.  Transabdominal technique was performed for global imaging of the pelvis including uterus, ovaries, adnexal regions, and pelvic cul-de-sac. It was necessary to proceed with endovaginal exam following the transabdominal exam to visualize the endometrium.  COMPARISON:  Pelvic ultrasound 06/05/2013  FINDINGS: Uterus  Measurements: 6.9 x 3.8 x 5.1 cm. Retroverted. There is a 5.8 x 4.8 x 5.0 cm fibroid within the right aspect of the uterine body with a large submucosal component. Additionally there is a 1.1 x 0.8 x 1.2 cm intramural fibroid with the uterine fundus. There is an additional adjacent 0.6 x 0.7 x 0.8 cm intramural fibroid within the uterine fundus.  Endometrium  Thickness: 3 mm.  No focal abnormality visualized.  Right ovary  Not visualized.  Left ovary  Measurements: 3.1 x 2.1 x 1.9 cm. Normal appearance/no adnexal mass.  Other findings  No abnormal free fluid.  IMPRESSION: Large fibroid within the right aspect of the uterine body with a significant submucosal component which appears to exert mass effect and displace the endometrium. This may represent the causative etiology for uterine bleeding.  Endometrium measures 3 mm. If bleeding remains unresponsive to hormonal or medical therapy, sonohysterogram should be considered for focal lesion work-up. (Ref: Radiological Reasoning: Algorithmic Workup of Abnormal Vaginal Bleeding with Endovaginal Sonography and Sonohysterography. AJR [id number])   Assessment/Plan: 1.Abnormal uterine bleeding - Discussion had with patient regarding all labs and imaging. Patient previously was recommended for hysterectomy however patient noted at the time that she was not ready  proceed to proceed with surgical management. Notes that she may be more amenable to surgery however still would like to consider all other alternatives. Reports that she does not currently have insurance, however has started a new job and is on 36 day  probation. The probation will be up in January. After this time the patient has the option for taking time from her job if surgical management is needed. Discussed the patient can be managed medically, with hormonal therapy. Can discuss further at outpatient follow-up once Pap smear and endometrial biopsy had been performed.  Patient to continue on Provera 10 mg tablets for 10 days. 2. Symptomatic anemia - patient now status post transfusion of 3 units PRBCs, blood count has responded appropriately. Patient is to continue iron therapy as an outpatient. 3. Abnormal thyroid study - patient with an abnormal TSH, however free T4 is normal. Will repeat levels in several weeks. Not likely a cause of her abnormal bleeding at this time. 4. Fibroid uterus - ultrasound notes additional fibroids, and enlargement in size of original fibroid by approximately 2 cm. Patient was informed of results.  Discussed management options as noted in discussion of her abnormal uterine bleeding.  Dispo: Patient can d/c home today. To follow up in 2 weeks in the office.   LOS: 0 days    Rubie Maid 07/12/2016, 12:25 PM

## 2016-07-12 NOTE — Discharge Instructions (Signed)
Menorrhagia Menorrhagia is when your menstrual periods are heavy or last longer than usual. Follow these instructions at home:  Only take medicine as told by your doctor.  Take any iron pills as told by your doctor. Heavy bleeding may cause low levels of iron in your body.  Do not take aspirin 1 week before or during your period. Aspirin can make the bleeding worse.  Lie down for a while if you change your tampon or pad more than once in 2 hours. This may help lessen the bleeding.  Eat a healthy diet and foods with iron. These foods include leafy green vegetables, meat, liver, eggs, and whole grain breads and cereals.  Do not try to lose weight. Wait until the heavy bleeding has stopped and your iron level is normal. Contact a doctor if:  You soak through a pad or tampon every 1 or 2 hours, and this happens every time you have a period.  You need to use pads and tampons at the same time because you are bleeding so much.  You need to change your pad or tampon during the night.  You have a period that lasts for more than 8 days.  You pass clots bigger than 1 inch (2.5 cm) wide.  You have irregular periods that happen more or less often than once a month.  You feel dizzy or pass out (faint).  You feel very weak or tired.  You feel short of breath or feel your heart is beating too fast when you exercise.  You feel sick to your stomach (nausea) and you throw up (vomit) while you are taking your medicine.  You have watery poop (diarrhea) while you are taking your medicine.  You have any problems that may be related to the medicine you are taking. Get help right away if:  You soak through 4 or more pads or tampons in 2 hours.  You have any bleeding while you are pregnant. This information is not intended to replace advice given to you by your health care provider. Make sure you discuss any questions you have with your health care provider. Document Released: 05/22/2008 Document  Revised: 01/19/2016 Document Reviewed: 02/12/2013 Elsevier Interactive Patient Education  2017 [employer]  

## 2016-07-12 NOTE — Progress Notes (Signed)
Pt returned from ultrasound to room 347.

## 2016-07-13 ENCOUNTER — Encounter: Payer: Self-pay | Admitting: Obstetrics and Gynecology

## 2016-07-13 LAB — TYPE AND SCREEN
ABO/RH(D): O POS
Antibody Screen: NEGATIVE
UNIT DIVISION: 0
UNIT DIVISION: 0
Unit division: 0

## 2016-07-13 LAB — HEPATITIS B SURFACE ANTIGEN: Hepatitis B Surface Ag: NEGATIVE

## 2016-07-13 LAB — RPR: RPR Ser Ql: NONREACTIVE

## 2016-07-13 LAB — T3: T3, Total: 123 ng/dL (ref 71–180)

## 2016-07-13 LAB — T3 UPTAKE: T3 UPTAKE RATIO: 31 % (ref 24–39)

## 2016-07-29 ENCOUNTER — Emergency Department: Payer: Commercial Managed Care - HMO

## 2016-07-29 ENCOUNTER — Emergency Department
Admission: EM | Admit: 2016-07-29 | Discharge: 2016-07-29 | Disposition: A | Discharge status: Home / Self Care | Payer: Commercial Managed Care - HMO | Attending: Emergency Medicine | Admitting: Emergency Medicine

## 2016-07-29 DIAGNOSIS — S92514A Nondisplaced fracture of proximal phalanx of right lesser toe(s), initial encounter for closed fracture: Secondary | ICD-10-CM | POA: Insufficient documentation

## 2016-07-29 DIAGNOSIS — Z79899 Other long term (current) drug therapy: Secondary | ICD-10-CM | POA: Insufficient documentation

## 2016-07-29 DIAGNOSIS — Y939 Activity, unspecified: Secondary | ICD-10-CM | POA: Insufficient documentation

## 2016-07-29 DIAGNOSIS — Y929 Unspecified place or not applicable: Secondary | ICD-10-CM | POA: Insufficient documentation

## 2016-07-29 DIAGNOSIS — Y999 Unspecified external cause status: Secondary | ICD-10-CM | POA: Insufficient documentation

## 2016-07-29 DIAGNOSIS — W228XXA Striking against or struck by other objects, initial encounter: Secondary | ICD-10-CM | POA: Insufficient documentation

## 2016-07-29 DIAGNOSIS — S99921A Unspecified injury of right foot, initial encounter: Secondary | ICD-10-CM | POA: Diagnosis present

## 2016-07-29 MED ORDER — MELOXICAM 7.5 MG PO TABS
7.5000 mg | ORAL_TABLET | Freq: Once | ORAL | Status: AC
  Administered 2016-07-29: 7.5 mg via ORAL
  Filled 2016-07-29: qty 1

## 2016-07-29 MED ORDER — KETOROLAC TROMETHAMINE 60 MG/2ML IM SOLN
60.0000 mg | Freq: Once | INTRAMUSCULAR | Status: DC
  Filled 2016-07-29: qty 2

## 2016-07-29 MED ORDER — MELOXICAM 7.5 MG PO TABS: 7.5000 mg | ORAL_TABLET | Freq: Every day | ORAL | 2 refills | Status: DC

## 2016-07-29 NOTE — ED Notes (Signed)
Pt says she thinks she broke 2 toes; declined offer of wheelchair

## 2016-07-29 NOTE — ED Triage Notes (Signed)
Pt with right third toe injury. Pt states she hit it on a door, nail is intact, but some dried blood noted around edge. Pt complains of pain on touching toe.

## 2016-07-29 NOTE — ED Notes (Signed)
NAD noted at time of D/C. Pt denies questions or concerns. Pt ambulatory to the lobby at this time.  

## 2016-07-29 NOTE — ED Provider Notes (Signed)
[facility] Emergency Department Provider Note ____________________________________________  Time seen: Approximately 11:35 PM  I have reviewed the triage vital signs and the nursing notes.   HISTORY  Chief Complaint Toe Injury    HPI Sharon Marquez is a 45 y.o. female is presenting to the emergency department with right fourth digit pain of the lower extremity. Patient states that she struck her foot this evening. Patient has noticed bruising around the digit since the time of injury. She does not have a history of prior traumas or surgeries to the right lower extremity. She ambulates at baseline and was able to walk after the injury. She reports no radiculopathy. Patient has not attempted alleviating measures. She currently rates pain at 10/10 in intensity and describes it as sharp.  Past Medical History:  Diagnosis Date  . Anemia   . Blood transfusion without reported diagnosis   . Fibroid uterus 2014  . History of blood transfusion 2014, 2015   for symptomatic anemia    Patient Active Problem List   Diagnosis Date Noted  . Anemia 07/11/2016    Past Surgical History:  Procedure Laterality Date  . CHOLECYSTECTOMY      Prior to Admission medications   Medication Sig Start Date End Date Taking? Authorizing Provider  docusate sodium (COLACE) 100 MG capsule Take 1 capsule (100 mg total) by mouth daily as needed for mild constipation. 07/12/16   Rubie Maid, MD  Ferrous Sulfate (SLOW FE) 142 (45 Fe) MG TBCR Take 1 tablet by mouth 3 (three) times daily. 07/12/16   Rubie Maid, MD  ibuprofen (ADVIL,MOTRIN) 200 MG tablet Take 200 mg by mouth every 6 (six) hours as needed for headache or mild pain.     Historical Provider, MD  medroxyPROGESTERone (PROVERA) 10 MG tablet Take 1 tablet (10 mg total) by mouth daily. 07/13/16   Rubie Maid, MD  meloxicam (MOBIC) 7.5 MG tablet Take 1 tablet (7.5 mg total) by mouth daily. 07/29/16 07/29/17  Lannie Fields, PA-C     Allergies Patient has no known allergies.  Family History  Problem Relation Age of Onset  . Hyperthyroidism Mother   . Hyperthyroidism Maternal Aunt   . Diabetes Maternal Grandmother     Social History Social History  Substance Use Topics  . Smoking status: Never Smoker  . Smokeless tobacco: Never Used  . Alcohol use No    Review of Systems Constitutional: No recent illness. Cardiovascular: Denies chest pain or palpitations. Respiratory: Denies shortness of breath. Musculoskeletal: Pain in right fourth digit, lower extremity.  Skin: Patient has bruising of the skin overlying the fourth right digit, lower extremity. Neurological: Negative for focal weakness or numbness.  ____________________________________________   PHYSICAL EXAM:  VITAL SIGNS: ED Triage Vitals  Enc Vitals Group     BP 07/29/16 2016 130/62     Pulse Rate 07/29/16 2016 68     Resp 07/29/16 2016 16     Temp 07/29/16 2016 98.3 F (36.8 C)     Temp Source 07/29/16 2016 Oral     SpO2 07/29/16 2016 100 %     Weight 07/29/16 2016 262 lb (118.8 kg)     Height 07/29/16 2016 5\' 2"  (1.575 m)     Head Circumference --      Peak Flow --      Pain Score 07/29/16 2017 10     Pain Loc --      Pain Edu? --      Excl. in [place]? --  Constitutional: Alert and oriented. Well appearing and in no acute distress. Eyes: Conjunctivae are normal. EOMI. Head: Atraumatic. Neck: No stridor.  Respiratory: Normal respiratory effort.   Musculoskeletal: Patient has 5 out of 5 strength in the lower extremities bilaterally. Patient has full range of dorsiflexion and plantar flexion at the right ankle. Patient is able to move all 5 of her right lower extremity digits. Palpable dorsalis pedis pulse. Patient is very tender to palpation over the fourth right lower extremity digit.  Neurologic:  Normal speech and language. No gross focal neurologic deficits are appreciated. Speech is normal. No gait instability. Skin:  Patient  has bruising of the skin overlying the fourth right digit, lower extremity. Psychiatric: Mood and affect are normal. Speech and behavior are normal.  ____________________________________________   LABS (all labs ordered are listed, but only abnormal results are displayed)  Labs Reviewed - No data to display ____________________________________________  RADIOLOGY  Probable fourth proximal phalangeal fracture, nondisplaced. No  dislocation.      PROCEDURES  Procedure(s) performed:  Toradol Immobilization and buddy taping. Post op shoe  ____________________________________________   INITIAL IMPRESSION / ASSESSMENT AND PLAN / ED COURSE  Clinical Course     Pertinent labs & imaging results that were available during my care of the patient were reviewed by me and considered in my medical decision making (see chart for details).  Assessment and plan: Nondisplaced fracture of proximal phalanx of lesser toe of right foot, initial encounter Patient has tenderness to palpation along the fourth right digit. DG foot complete indicates a probable fourth proximal phalangeal fracture that is nondisplaced without dislocation. Patient was advised to make an appointment with podiatry on Monday with Dr. Vickki Muff. Fourth digit was buddy taped in the emergency department with a post op shoe. She was given a prescription for Mobic to use as needed for pain and inflammation. All patient questions were answered. ____________________________________________   FINAL CLINICAL IMPRESSION(S) / ED DIAGNOSES  Final diagnoses:  Closed nondisplaced fracture of proximal phalanx of lesser toe of right foot, initial encounter       Lannie Fields, PA-C 07/29/16 2347    Carrie Mew, MD 08/02/16 0710

## 2016-08-01 ENCOUNTER — Encounter: Payer: Self-pay | Admitting: Obstetrics and Gynecology

## 2016-08-02 ENCOUNTER — Emergency Department
Admission: EM | Admit: 2016-08-02 | Discharge: 2016-08-02 | Disposition: A | Discharge status: Home / Self Care | Payer: Commercial Managed Care - HMO | Attending: Emergency Medicine | Admitting: Emergency Medicine

## 2016-08-02 ENCOUNTER — Ambulatory Visit (INDEPENDENT_AMBULATORY_CARE_PROVIDER_SITE_OTHER): Payer: Commercial Managed Care - HMO | Admitting: Obstetrics and Gynecology

## 2016-08-02 ENCOUNTER — Encounter: Payer: Self-pay | Admitting: Emergency Medicine

## 2016-08-02 VITALS — BP 117/64 | HR 58 | Ht 63.0 in | Wt 255.2 lb

## 2016-08-02 DIAGNOSIS — D250 Submucous leiomyoma of uterus: Secondary | ICD-10-CM

## 2016-08-02 DIAGNOSIS — Z9289 Personal history of other medical treatment: Secondary | ICD-10-CM

## 2016-08-02 DIAGNOSIS — S92514D Nondisplaced fracture of proximal phalanx of right lesser toe(s), subsequent encounter for fracture with routine healing: Secondary | ICD-10-CM

## 2016-08-02 DIAGNOSIS — S99921D Unspecified injury of right foot, subsequent encounter: Secondary | ICD-10-CM | POA: Diagnosis present

## 2016-08-02 DIAGNOSIS — X58XXXD Exposure to other specified factors, subsequent encounter: Secondary | ICD-10-CM | POA: Diagnosis not present

## 2016-08-02 DIAGNOSIS — D252 Subserosal leiomyoma of uterus: Secondary | ICD-10-CM | POA: Diagnosis not present

## 2016-08-02 DIAGNOSIS — D500 Iron deficiency anemia secondary to blood loss (chronic): Secondary | ICD-10-CM

## 2016-08-02 DIAGNOSIS — N939 Abnormal uterine and vaginal bleeding, unspecified: Secondary | ICD-10-CM | POA: Diagnosis not present

## 2016-08-02 MED ORDER — KETOROLAC TROMETHAMINE 10 MG PO TABS: 10.0000 mg | ORAL_TABLET | Freq: Four times a day (QID) | ORAL | 0 refills | Status: DC | PRN

## 2016-08-02 MED ORDER — MEDROXYPROGESTERONE ACETATE 5 MG PO TABS: 5.0000 mg | ORAL_TABLET | Freq: Every day | ORAL | 6 refills | Status: DC

## 2016-08-02 NOTE — ED Provider Notes (Signed)
[facility] Emergency Department Provider Note  ____________________________________________  Time seen: Approximately 6:07 PM  I have reviewed the triage vital signs and the nursing notes.   HISTORY  Chief Complaint Foot Pain    HPI Sharon Marquez is a 45 y.o. female who returns emergency department complaining of pain to the toe. Patient was seen in this department for days prior diagnosed with a fractured toe. Patient reports that she went home and start taking the meloxicam started having itching. She states that she stopped this medication. She is requesting another medication allergies no problems with Tylenol, Motrin, Advil, Aleve. Patient also received Toradol injection with no issues. Patient is requesting new prescription for medication so that she can have pain relief without itching. No complaints at this time.   Past Medical History:  Diagnosis Date  . Anemia   . Blood transfusion without reported diagnosis   . Fibroid uterus 2014  . History of blood transfusion 2014, 2015   for symptomatic anemia    Patient Active Problem List   Diagnosis Date Noted  . History of blood transfusion 08/02/2016  . Anemia 07/11/2016  . Fibroid uterus 08/27/2012    Past Surgical History:  Procedure Laterality Date  . CHOLECYSTECTOMY      Prior to Admission medications   Medication Sig Start Date End Date Taking? Authorizing Provider  docusate sodium (COLACE) 100 MG capsule Take 1 capsule (100 mg total) by mouth daily as needed for mild constipation. Patient not taking: Reported on 08/02/2016 07/12/16   Rubie Maid, MD  Ferrous Sulfate (SLOW FE) 142 (45 Fe) MG TBCR Take 1 tablet by mouth 3 (three) times daily. 07/12/16   Rubie Maid, MD  ibuprofen (ADVIL,MOTRIN) 200 MG tablet Take 200 mg by mouth every 6 (six) hours as needed for headache or mild pain.     Historical Provider, MD  ketorolac (TORADOL) 10 MG tablet Take 1 tablet (10 mg total) by mouth every  6 (six) hours as needed. 08/02/16   Charline Bills Tierany Appleby, PA-C  medroxyPROGESTERone (PROVERA) 5 MG tablet Take 1 tablet (5 mg total) by mouth daily. 08/02/16   Rubie Maid, MD    Allergies Meloxicam  Family History  Problem Relation Age of Onset  . Hyperthyroidism Mother   . Hyperthyroidism Maternal Aunt   . Diabetes Maternal Grandmother     Social History Social History  Substance Use Topics  . Smoking status: Never Smoker  . Smokeless tobacco: Never Used  . Alcohol use No     Review of Systems  Constitutional: No fever/chills Cardiovascular: no chest pain. Respiratory: no cough. No SOB. Musculoskeletal: Positive for pain to the fourth toe. Skin: Negative for rash, abrasions, lacerations, ecchymosis. Neurological: Negative for headaches, focal weakness or numbness. 10-point ROS otherwise negative.  ____________________________________________   PHYSICAL EXAM:  VITAL SIGNS: ED Triage Vitals  Enc Vitals Group     BP 08/02/16 1611 138/71     Pulse Rate 08/02/16 1611 (!) 18     Resp 08/02/16 1611 18     Temp 08/02/16 1611 98.5 F (36.9 C)     Temp Source 08/02/16 1611 Oral     SpO2 08/02/16 1611 100 %     Weight 08/02/16 1611 255 lb (115.7 kg)     Height 08/02/16 1611 5\' 2"  (1.575 m)     Head Circumference --      Peak Flow --      Pain Score 08/02/16 1606 10     Pain Loc --  Pain Edu? --      Excl. in [place]? --      Constitutional: Alert and oriented. Well appearing and in no acute distress. Eyes: Conjunctivae are normal. PERRL. EOMI. Head: Atraumatic. Neck: No stridor.    Cardiovascular: Normal rate, regular rhythm. Normal S1 and S2.  Good peripheral circulation. Respiratory: Normal respiratory effort without tachypnea or retractions. Lungs CTAB. Good air entry to the bases with no decreased or absent breath sounds. Musculoskeletal: Full range of motion to all extremities. No gross deformities appreciated.Fourth digit is buddy taped. Tape was not removed  for evaluation. No surrounding erythema or edema to the foot. Neurologic:  Normal speech and language. No gross focal neurologic deficits are appreciated.  Skin:  Skin is warm, dry and intact. No rash noted. Psychiatric: Mood and affect are normal. Speech and behavior are normal. Patient exhibits appropriate insight and judgement.   ____________________________________________   LABS (all labs ordered are listed, but only abnormal results are displayed)  Labs Reviewed - No data to display ____________________________________________  EKG   ____________________________________________  RADIOLOGY   Imaging from 07/29/2016 was reviewed ____________________________________________    PROCEDURES  Procedure(s) performed:    Procedures    Medications - No data to display   ____________________________________________   INITIAL IMPRESSION / ASSESSMENT AND PLAN / ED COURSE  Pertinent labs & imaging results that were available during my care of the patient were reviewed by me and considered in my medical decision making (see chart for details).  Review of the [place] CSRS was performed in accordance of the [place] prior to dispensing any controlled drugs.  Clinical Course     Patient's diagnosis is consistent with Return for toe fracture. Patient is requesting new medication as meloxicam caused itching. Patient states that she has no problems other NSAIDs. Patient also received Toradol injection at last visit with no issues. Patient will be discharged home with Toradol tablets.. Patient will follow-up with podiatry as needed. Patient is given ED precautions to return to the ED for any worsening or new symptoms.     ____________________________________________  FINAL CLINICAL IMPRESSION(S) / ED DIAGNOSES  Final diagnoses:  Closed nondisplaced fracture of proximal phalanx of lesser toe of right foot with routine healing, subsequent encounter      NEW MEDICATIONS STARTED  DURING THIS VISIT:  New Prescriptions   KETOROLAC (TORADOL) 10 MG TABLET    Take 1 tablet (10 mg total) by mouth every 6 (six) hours as needed.        This chart was dictated using voice recognition software/Dragon. Despite best efforts to proofread, errors can occur which can change the meaning. Any change was purely unintentional.    Darletta Moll, PA-C 08/02/16 1821    Orbie Pyo, MD 08/03/16 0030

## 2016-08-02 NOTE — Progress Notes (Signed)
GYNECOLOGY PROGRESS NOTE  Subjective:    Patient ID: Sharon Marquez, female    DOB: 02/24/1971, 45 y.o.   MRN: [id number]  HPI  Patient is a 45 y.o. G21P0101 female who presents as follow up from recent hospitalization several weeks ago for symptomatic anemia with near syncopal episode due to fibroid uterus and abnormal uterine bleeding.  Patient received a blood transfusion of 3 units PRBCs (Hgb improved from 5.4 to 8.9), and was treated with Provera tablets for bleeding.  Comes to follow up for further workup and discussion of management options.  Notes no further bleeding since recent admission.   Past Medical History:  Diagnosis Date  . Anemia   . Fibroid uterus 2014  . History of blood transfusion 2014, 2015, 2017   for symptomatic anemia    Family History  Problem Relation Age of Onset  . Hyperthyroidism Mother   . Hyperthyroidism Maternal Aunt   . Diabetes Maternal Grandmother    Past Surgical History:  Procedure Laterality Date  . CHOLECYSTECTOMY     Social History   Social History  . Marital status: Single    Spouse name: N/A  . Number of children: N/A  . Years of education: N/A   Occupational History  . Not on file.   Social History Main Topics  . Smoking status: Never Smoker  . Smokeless tobacco: Never Used  . Alcohol use No  . Drug use: Unknown  . Sexual activity: No   Other Topics Concern  . Not on file   Social History Narrative  . No narrative on file    Allergies  Allergen Reactions  . Meloxicam Itching    Current Outpatient Prescriptions on File Prior to Visit  Medication Sig Dispense Refill  . Ferrous Sulfate (SLOW FE) 142 (45 Fe) MG TBCR Take 1 tablet by mouth 3 (three) times daily. 90 tablet 6  . ibuprofen (ADVIL,MOTRIN) 200 MG tablet Take 200 mg by mouth every 6 (six) hours as needed for headache or mild pain.     Marland Kitchen docusate sodium (COLACE) 100 MG capsule Take 1 capsule (100 mg total) by mouth daily as needed for mild constipation.  (Patient not taking: Reported on 08/02/2016) 30 capsule 3   No current facility-administered medications on file prior to visit.      Review of Systems Constitutional: positive for fatigue.  Negative for chills, , fevers and sweats Eyes: negative for irritation, redness and visual disturbance Ears, nose, mouth, throat, and face: negative for hearing loss, nasal congestion, snoring and tinnitus Respiratory: negative for asthma, cough, sputum Cardiovascular: negative for chest pain, dyspnea, exertional chest pressure/discomfort, irregular heart beat, palpitations and syncope Gastrointestinal: negative for abdominal pain, change in bowel habits, nausea and vomiting Genitourinary: negative for abnormal menstrual periods, genital lesions, sexual problems and vaginal discharge, dysuria and urinary incontinence Integument/breast: negative for breast lump, breast tenderness and nipple discharge Hematologic/lymphatic: negative for bleeding and easy bruising Musculoskeletal:negative for back pain and muscle weakness Neurological: negative for dizziness, headaches, vertigo and weakness Endocrine: negative for diabetic symptoms including polydipsia, polyuria and skin dryness Allergic/Immunologic: negative for hay fever and urticaria      Objective:   Blood pressure 117/64, pulse (!) 58, height 5\' 3"  (1.6 m), weight 255 lb 3.2 oz (115.8 kg), last menstrual period 07/10/2016. Body mass index is 45.21 kg/m.   General appearance: alert and no distress, morbid obesity Eyes: negative findings: conjunctivae and sclerae normal and pupils equal, round, reactive to light and accomodation Lungs:  clear to auscultation bilaterally Heart: regular rate and rhythm, S1, S2 normal, no murmur, click, rub or gallop Abdomen: soft, non-tender; bowel sounds normal; no masses,  no organomegaly Pelvic: deferred  (per last exam on admission, Uterine enlargement present, ~ 12-14 week size but difficult to assess secondary to  body habitus, normal adnexae).   Extremities: extremities normal, atraumatic, no cyanosis or edema Neurologic: Grossly normal    Labs:   CBC Latest Ref Rng & Units 07/12/2016 07/11/2016  WBC 3.4 - 10.8 x10E3/uL - 4.2  Hemoglobin 12.0 - 16.0 g/dL 8.9(L) 5.8(L)  Hematocrit 34.0 - 46.6 % 28.2(L) 20.0(L)  Platelets 150 - 379 x10E3/uL - 729(H)   Results for Sharon Marquez, Sharon Marquez (MRN [id number]) as of 08/02/2016 17:30  Ref. Range 07/11/2016 23:18 07/12/2016 09:19  TSH Latest Ref Range: 0.350 - 4.500 uIU/mL 4.726 (H)   Triiodothyronine (T3) Latest Ref Range: 71 - 180 ng/dL  123  T4,Free(Direct) Latest Ref Range: 0.61 - 1.12 ng/dL  0.91  T3 Uptake Ratio Latest Ref Range: 24 - 39 %  31    Ref. Range 07/11/2016 23:18  RPR Latest Ref Range: Non Reactive  Non Reactive  Hepatitis B Surface Ag Latest Ref Range: Negative  Negative  HIV 1/2 Antibodies Latest Ref Range: NON REACTIVE  NON REACTIVE  Interpretation (HIV Ag Ab) Unknown A non reactive te...  HIV-1 P24 Antigen - HIV24 Latest Ref Range: NON REACTIVE  NON REACTIVE     Imaging:  CLINICAL DATA:  Patient with abnormal uterine bleeding.  EXAM: TRANSABDOMINAL AND TRANSVAGINAL ULTRASOUND OF PELVIS  TECHNIQUE: Both transabdominal and transvaginal ultrasound examinations of the pelvis were performed. Transabdominal technique was performed for global imaging of the pelvis including uterus, ovaries, adnexal regions, and pelvic cul-de-sac. It was necessary to proceed with endovaginal exam following the transabdominal exam to visualize the endometrium.  COMPARISON:  Pelvic ultrasound 06/05/2013  FINDINGS: Uterus  Measurements: 6.9 x 3.8 x 5.1 cm. Retroverted. There is a 5.8 x 4.8 x 5.0 cm fibroid within the right aspect of the uterine body with a large submucosal component. Additionally there is a 1.1 x 0.8 x 1.2 cm intramural fibroid with the uterine fundus. There is an additional adjacent 0.6 x 0.7 x 0.8 cm intramural fibroid within  the uterine fundus.  Endometrium  Thickness: 3 mm.  No focal abnormality visualized.  Right ovary  Not visualized.  Left ovary  Measurements: 3.1 x 2.1 x 1.9 cm. Normal appearance/no adnexal mass.  Other findings  No abnormal free fluid.  IMPRESSION: Large fibroid within the right aspect of the uterine body with a significant submucosal component which appears to exert mass effect and displace the endometrium. This may represent the causative etiology for uterine bleeding.  Endometrium measures 3 mm. If bleeding remains unresponsive to hormonal or medical therapy, sonohysterogram should be considered for focal lesion work-up. (Ref: Radiological Reasoning: Algorithmic Workup of Abnormal Vaginal Bleeding with Endovaginal Sonography and Sonohysterography. [id number])  Assessment:   Abnormal uterine bleeding Fibroid uterus Anemia History of blood transfusions Morbid obesity  Plan:   - Patient has abnormal uterine bleeding, likely due to fibroid uterus, although obesity may also play a role.  Patient long-standing h/o anemia, requiring blood transfusions almost yearly for the past 3 years.  Patient had been counseled in the past by a previous GYN on hysterectomy but at that time was not ready for surgical intervention. Discussed all management options for abnormal uterine bleeding including tranexamic acid (Lysteda), oral progesterone, Depo Provera, Mirena IUD  or endometrial ablation (if hysteroscopic resection of submucosal fibroid also performed), or definitive  hysterectomy as definitive surgical management.  Discussed risks and benefits of each method.   Patient still hesitant regarding surgical management.  Desires to try medication first.  Can prescribe Provera 5 mg tablets daily.  Discussed need for endometrial biopsy pror to initiation of treatment to rule out malignancy as cause of abnormal uterine bleeding.  Patient desires to defer to another visit.   Will schedule for 2 weeks.  Printed patient education handouts were given to the patient to review at home. - Will recheck Hgb levels as patient continues to c/o fatigue and weakness despite no further blood loss since admission. Notes compliance with iron tablets.  Advised that there may not be a significant rise as patient is only 3 weeks on iron therapy.  May need referral to Hematology for iron infusions/injections if Hgb still the same or lower for management of anemia symptoms.  - Discussed weight loss management as weight may play a factor in abnormal bleeding.    A total of 45 minutes were spent face-to-face with the patient during this encounter and over half of that time dealt with counseling and coordination of care.   Rubie Maid, MD [facility]

## 2016-08-02 NOTE — ED Triage Notes (Signed)
Patient presents to the ED with left foot pain after being seen in the ED several days ago for the same.  Patient states, "the medicine for pain they gave me made me itch and break out so I stopped taking it but tylenol is not working and I can't go back to work with this much pain."  Patient walking with slight limp.  No obvious distress at this time.

## 2016-08-02 NOTE — ED Notes (Signed)
Pt has increased pain in right foot after toe fx.  Pt taking tylenol without relief.  Pt wearing a cast shoe on right foot.

## 2016-08-03 LAB — CBC
Hematocrit: 27.7 % — ABNORMAL LOW (ref 34.0–46.6)
Hemoglobin: 8.9 g/dL — ABNORMAL LOW (ref 11.1–15.9)
MCH: 25.7 pg — ABNORMAL LOW (ref 26.6–33.0)
MCHC: 32.1 g/dL (ref 31.5–35.7)
MCV: 80 fL (ref 79–97)
Platelets: 350 10*3/uL (ref 150–379)
RBC: 3.46 x10E6/uL — ABNORMAL LOW (ref 3.77–5.28)
RDW: 31.3 % — AB (ref 12.3–15.4)
WBC: 4.3 10*3/uL (ref 3.4–10.8)

## 2016-08-05 ENCOUNTER — Encounter: Payer: Self-pay | Admitting: Obstetrics and Gynecology

## 2016-08-05 DIAGNOSIS — N939 Abnormal uterine and vaginal bleeding, unspecified: Secondary | ICD-10-CM | POA: Insufficient documentation

## 2016-08-06 ENCOUNTER — Telehealth: Payer: Self-pay

## 2016-08-06 DIAGNOSIS — D500 Iron deficiency anemia secondary to blood loss (chronic): Secondary | ICD-10-CM

## 2016-08-06 DIAGNOSIS — Z9289 Personal history of other medical treatment: Secondary | ICD-10-CM

## 2016-08-06 NOTE — Telephone Encounter (Signed)
-----   Message from Rubie Maid, MD sent at 08/03/2016 10:51 AM EST ----- Please inform patient that her Hgb has not improved at all since her recent admission despite taking PO iron.  She will need a referral to Hematology at the [facility] for iron infusions or injections.

## 2016-08-06 NOTE — Telephone Encounter (Signed)
Called pt no answer. LM for pt informing her of information below. Referral placed.

## 2016-08-06 NOTE — Telephone Encounter (Signed)
Pt returns call informed her that referral had been placed for hematology, placed referral as urgent. Advised pt to call back if she had not heard back from referral in 72 hours. Pt gives verbal understanding.

## 2016-08-21 NOTE — Progress Notes (Signed)
[place]  [phone]) [phone] [phone]) [phone]  ID: [name] OB: Feb 15, 1971  MR#: [id number]  [id number]  Patient Care Team: No Pcp Per Patient as PCP - General (General Practice)  CHIEF COMPLAINT: Iron deficiency anemia, B 12 deficiency.  INTERVAL HISTORY: Patient is a 45 year old female who was recently admitted to the hospital with severe symptomatic anemia likely secondary to heavy menses. She received multiple units of packed red blood cells with improvement of her hemoglobin. She continues to feel weak and fatigued, but otherwise feels well. She has no neurologic complaints. She denies any other bleeding. She denies any recent fevers or illnesses. She has good appetite and denies weight loss. She has no chest pain or shortness of breath. She denies any nausea, vomiting, constipation, or diarrhea. She has no urinary complaints. Patient otherwise feels well and offers no further specific complaints.  REVIEW OF SYSTEMS:   Review of Systems  Constitutional: Positive for malaise/fatigue. Negative for fever and weight loss.  Respiratory: Negative.  Negative for cough and shortness of breath.   Cardiovascular: Negative.  Negative for chest pain and leg swelling.  Gastrointestinal: Negative.  Negative for abdominal pain, blood in stool and melena.  Genitourinary: Negative.  Negative for hematuria.  Neurological: Positive for weakness.  Psychiatric/Behavioral: Negative.  The patient is not nervous/anxious.     As per HPI. Otherwise, a complete review of systems is negative.  PAST MEDICAL HISTORY: Past Medical History:  Diagnosis Date  . Anemia   . Blood transfusion without reported diagnosis   . Fibroid uterus 2014  . History of blood transfusion 2014, 2015   for symptomatic anemia    PAST SURGICAL HISTORY: Past Surgical History:  Procedure Laterality Date  . CHOLECYSTECTOMY      FAMILY HISTORY: Family History  Problem Relation Age of  Onset  . Hyperthyroidism Mother   . Hyperthyroidism Maternal Aunt   . Diabetes Maternal Grandmother     ADVANCED DIRECTIVES (Y/N):  N  HEALTH MAINTENANCE: Social History  Substance Use Topics  . Smoking status: Never Smoker  . Smokeless tobacco: Never Used  . Alcohol use No     Colonoscopy:  PAP:  Bone density:  Lipid panel:  Allergies  Allergen Reactions  . Meloxicam Itching  . Tramadol Itching and Swelling    Current Outpatient Prescriptions  Medication Sig Dispense Refill  . docusate sodium (COLACE) 100 MG capsule Take 1 capsule (100 mg total) by mouth daily as needed for mild constipation. 30 capsule 3  . Ferrous Sulfate (SLOW FE) 142 (45 Fe) MG TBCR Take 1 tablet by mouth 3 (three) times daily. 90 tablet 6   No current facility-administered medications for this visit.     OBJECTIVE: Vitals:   08/22/16 1101  BP: 129/82  Pulse: 69  Resp: 18  Temp: 97.4 F (36.3 C)     Body mass index is 45.85 kg/m.    ECOG FS:1 - Symptomatic but completely ambulatory  General: Well-developed, well-nourished, no acute distress. Eyes: Pink conjunctiva, anicteric sclera. HEENT: Normocephalic, moist mucous membranes, clear oropharnyx. Lungs: Clear to auscultation bilaterally. Heart: Regular rate and rhythm. No rubs, murmurs, or gallops. Abdomen: Soft, nontender, nondistended. No organomegaly noted, normoactive bowel sounds. Musculoskeletal: No edema, cyanosis, or clubbing. Neuro: Alert, answering all questions appropriately. Cranial nerves grossly intact. Skin: No rashes or petechiae noted. Psych: Normal affect. Lymphatics: No cervical, calvicular, axillary or inguinal LAD.   LAB RESULTS:  Lab Results  Component Value Date   NA 140 07/11/2016  K 3.1 (L) 07/11/2016   CL 108 07/11/2016   CO2 25 07/11/2016   GLUCOSE 116 (H) 07/11/2016   BUN 13 07/11/2016   CREATININE 0.55 07/11/2016   CALCIUM 8.6 (L) 07/11/2016   GFRNONAA >60 07/11/2016   GFRAA >60 07/11/2016     Lab Results  Component Value Date   WBC 3.8 08/22/2016   NEUTROABS 2.7 06/05/2013   HGB 9.5 (L) 08/22/2016   HCT 29.0 (L) 08/22/2016   MCV 86.8 08/22/2016   PLT 162 08/22/2016   Lab Results  Component Value Date   IRON 116 08/22/2016   TIBC 479 (H) 08/22/2016   IRONPCTSAT 24 08/22/2016   Lab Results  Component Value Date   FERRITIN 5 (L) 08/22/2016     STUDIES: Dg Foot Complete Right  Result Date: 07/29/2016 CLINICAL DATA:  Blunt trauma to the right forefoot tonight. EXAM: RIGHT FOOT COMPLETE - 3+ VIEW COMPARISON:  None. FINDINGS: Linear lucency traversing the distal aspect of the fourth proximal phalanx may represent an acute nondisplaced fracture. No dislocation. No radiopaque foreign body. IMPRESSION: Probable fourth proximal phalangeal fracture, nondisplaced. No dislocation. Electronically Signed   By: Andreas Newport M.D.   On: 07/29/2016 21:14    ASSESSMENT: Iron deficiency anemia, B 12 deficiency.  PLAN:    1. Iron deficiency anemia: Patient's hemoglobin continues to improve, but her iron stores are significantly decreased and she will benefit from IV iron. Other than a significantly decreased B-12 level, the remainder of her laboratory work is either negative or within normal limits. Her erythropoietin levels are appropriately elevated. Her reticulocyte count is inappropriately normal. Return to clinic in 1 and 2 weeks to receive 510 mg IV Feraheme. Patient will then return to clinic in 2 months with repeat laboratory work and further evaluation. 2. B 12 deficiency: Unclear etiology. Will give 1000 g intramuscular B-12 with IV Feraheme as above. Return to clinic in 1 month later for a second injection and then in 2 months for further evaluation as above. 3. Heavy menses: Continue treatment per OB/GYN.  Patient expressed understanding and was in agreement with this plan. She also understands that She can call clinic at any time with any questions, concerns, or  complaints.    Lloyd Huger, MD   08/24/2016 11:05 AM

## 2016-08-22 ENCOUNTER — Inpatient Hospital Stay: Payer: Commercial Managed Care - HMO | Attending: Oncology | Admitting: Oncology

## 2016-08-22 ENCOUNTER — Inpatient Hospital Stay: Payer: Commercial Managed Care - HMO

## 2016-08-22 ENCOUNTER — Encounter: Payer: Self-pay | Admitting: Oncology

## 2016-08-22 VITALS — BP 129/82 | HR 69 | Temp 97.4°F | Resp 18 | Wt 250.7 lb

## 2016-08-22 DIAGNOSIS — E538 Deficiency of other specified B group vitamins: Secondary | ICD-10-CM | POA: Insufficient documentation

## 2016-08-22 DIAGNOSIS — D508 Other iron deficiency anemias: Secondary | ICD-10-CM

## 2016-08-22 DIAGNOSIS — Z79899 Other long term (current) drug therapy: Secondary | ICD-10-CM | POA: Insufficient documentation

## 2016-08-22 DIAGNOSIS — N920 Excessive and frequent menstruation with regular cycle: Secondary | ICD-10-CM | POA: Insufficient documentation

## 2016-08-22 DIAGNOSIS — D500 Iron deficiency anemia secondary to blood loss (chronic): Secondary | ICD-10-CM | POA: Insufficient documentation

## 2016-08-22 LAB — RETICULOCYTES
RBC.: 3.35 MIL/uL — ABNORMAL LOW (ref 3.80–5.20)
RETIC COUNT ABSOLUTE: 36.9 10*3/uL (ref 19.0–183.0)
RETIC CT PCT: 1.1 % (ref 0.4–3.1)

## 2016-08-22 LAB — CBC
HCT: 29 % — ABNORMAL LOW (ref 35.0–47.0)
HEMOGLOBIN: 9.5 g/dL — AB (ref 12.0–16.0)
MCH: 28.4 pg (ref 26.0–34.0)
MCHC: 32.7 g/dL (ref 32.0–36.0)
MCV: 86.8 fL (ref 80.0–100.0)
PLATELETS: 162 10*3/uL (ref 150–440)
RBC: 3.35 MIL/uL — AB (ref 3.80–5.20)
RDW: 36.4 % — ABNORMAL HIGH (ref 11.5–14.5)
WBC: 3.8 10*3/uL (ref 3.6–11.0)

## 2016-08-22 LAB — FERRITIN: FERRITIN: 5 ng/mL — AB (ref 11–307)

## 2016-08-22 LAB — IRON AND TIBC
Iron: 116 ug/dL (ref 28–170)
Saturation Ratios: 24 % (ref 10.4–31.8)
TIBC: 479 ug/dL — ABNORMAL HIGH (ref 250–450)
UIBC: 363 ug/dL

## 2016-08-22 LAB — FOLATE: FOLATE: 10.6 ng/mL (ref >5.9)

## 2016-08-22 LAB — VITAMIN B12: Vitamin B-12: <50 pg/mL — ABNORMAL LOW (ref 180–914)

## 2016-08-22 LAB — DAT, POLYSPECIFIC AHG (ARMC ONLY): Polyspecific AHG test: NEGATIVE

## 2016-08-22 LAB — LACTATE DEHYDROGENASE: LDH: 255 U/L — ABNORMAL HIGH (ref 98–192)

## 2016-08-22 NOTE — Progress Notes (Signed)
New evaluation for anemia. Pt states has had several blood transfusions previously. Complains of feeling weak and fatigued.

## 2016-08-23 ENCOUNTER — Inpatient Hospital Stay: Payer: Commercial Managed Care - HMO

## 2016-08-23 VITALS — BP 118/71 | HR 71 | Temp 98.1°F | Resp 16

## 2016-08-23 DIAGNOSIS — D500 Iron deficiency anemia secondary to blood loss (chronic): Secondary | ICD-10-CM

## 2016-08-23 LAB — ERYTHROPOIETIN: ERYTHROPOIETIN: 51.4 m[IU]/mL — AB (ref 2.6–18.5)

## 2016-08-23 LAB — ANA W/REFLEX: ANA: NEGATIVE

## 2016-08-23 MED ORDER — SODIUM CHLORIDE 0.9 % IV SOLN
Freq: Once | INTRAVENOUS | Status: AC
  Administered 2016-08-23: 1 mL via INTRAVENOUS
  Filled 2016-08-23: qty 1000

## 2016-08-23 MED ORDER — SODIUM CHLORIDE 0.9 % IV SOLN
510.0000 mg | Freq: Once | INTRAVENOUS | Status: AC
  Administered 2016-08-23: 510 mg via INTRAVENOUS
  Filled 2016-08-23: qty 17

## 2016-08-24 ENCOUNTER — Other Ambulatory Visit: Payer: Self-pay | Admitting: Oncology

## 2016-08-27 LAB — HAPTOGLOBIN: HAPTOGLOBIN: 38 mg/dL (ref 34–200)

## 2016-08-28 ENCOUNTER — Ambulatory Visit (INDEPENDENT_AMBULATORY_CARE_PROVIDER_SITE_OTHER): Payer: Commercial Managed Care - HMO | Admitting: Obstetrics and Gynecology

## 2016-08-28 VITALS — BP 115/73 | HR 81 | Ht 63.0 in | Wt 251.3 lb

## 2016-08-28 DIAGNOSIS — R5382 Chronic fatigue, unspecified: Secondary | ICD-10-CM

## 2016-08-28 DIAGNOSIS — E6609 Other obesity due to excess calories: Secondary | ICD-10-CM

## 2016-08-28 DIAGNOSIS — Z6841 Body Mass Index (BMI) 40.0 and over, adult: Secondary | ICD-10-CM | POA: Diagnosis not present

## 2016-08-28 DIAGNOSIS — E538 Deficiency of other specified B group vitamins: Secondary | ICD-10-CM | POA: Diagnosis not present

## 2016-08-28 DIAGNOSIS — IMO0001 Reserved for inherently not codable concepts without codable children: Secondary | ICD-10-CM

## 2016-08-28 DIAGNOSIS — D509 Iron deficiency anemia, unspecified: Secondary | ICD-10-CM

## 2016-08-28 DIAGNOSIS — N939 Abnormal uterine and vaginal bleeding, unspecified: Secondary | ICD-10-CM | POA: Diagnosis not present

## 2016-08-28 MED ORDER — CYANOCOBALAMIN 1000 MCG/ML IJ SOLN: 1000.0000 ug | Freq: Once | INTRAMUSCULAR | Status: DC

## 2016-08-28 MED ORDER — CYANOCOBALAMIN 1000 MCG/ML IJ SOLN
1000.0000 ug | Freq: Once | INTRAMUSCULAR | Status: AC
  Administered 2016-08-28: 1000 ug via INTRAMUSCULAR

## 2016-08-28 NOTE — Progress Notes (Signed)
GYNECOLOGY PROGRESS NOTE  Subjective:    Patient ID: Sharon Marquez, female    DOB: 11/05/1970, 46 y.o.   MRN: [id number]  HPI  Patient is a 46 y.o. G38P0101 female with h/o uterine fibroids who presents for endometrial biopsy for abnormal uterine bleeding. Notes that she has been seen at the cancer center for first iron injection for symptomatic anemia, and has next treatment in 1 week.    The following portions of the patient's history were reviewed and updated as appropriate: allergies, current medications, past family history, past medical history, past social history, past surgical history and problem list.  Review of Systems A comprehensive review of systems was negative except for: Constitutional: positive for fatigue     Objective:   Blood pressure 115/73, pulse 81, height 5\' 3"  (1.6 m), weight 251 lb 4.8 oz (114 kg). Body mass index is 44.52 kg/m.  General appearance: alert and no distress. Morbid obesity.  Abdomen: soft, non-tender; bowel sounds normal; no masses,  no organomegaly Pelvic: external genitalia normal, rectovaginal septum normal.  Vagina without discharge.  Cervix normal appearing, no lesions and no motion tenderness.  Uterus mobile, nontender, enlarged (~ 14-16 week size).  Adnexae non-palpable, nontender bilaterally.  Extremities: extremities normal, atraumatic, no cyanosis or edema Neurologic: Grossly normal   Labs:  Results for orders placed or performed in visit on 08/22/16  CBC  Result Value Ref Range   WBC 3.8 3.6 - 11.0 K/uL   RBC 3.35 (L) 3.80 - 5.20 MIL/uL   Hemoglobin 9.5 (L) 12.0 - 16.0 g/dL   HCT 29.0 (L) 35.0 - 47.0 %   MCV 86.8 80.0 - 100.0 fL   MCH 28.4 26.0 - 34.0 pg   MCHC 32.7 32.0 - 36.0 g/dL   RDW 36.4 (H) 11.5 - 14.5 %   Platelets 162 150 - 440 K/uL  Reticulocytes  Result Value Ref Range   Retic Ct Pct 1.1 0.4 - 3.1 %   RBC. 3.35 (L) 3.80 - 5.20 MIL/uL   Retic Count, Manual 36.9 19.0 - 183.0 K/uL  Ferritin  Result Value Ref  Range   Ferritin 5 (L) 11 - 307 ng/mL  Iron and TIBC  Result Value Ref Range   Iron 116 28 - 170 ug/dL   TIBC 479 (H) 250 - 450 ug/dL   Saturation Ratios 24 10.4 - 31.8 %   UIBC 363 ug/dL  Lactate dehydrogenase  Result Value Ref Range   LDH 255 (H) 98 - 192 U/L  Erythropoietin  Result Value Ref Range   Erythropoietin 51.4 (H) 2.6 - 18.5 mIU/mL  Vitamin B12  Result Value Ref Range   Vitamin B-12 <50 (L) 180 - 914 pg/mL  Haptoglobin  Result Value Ref Range   Haptoglobin 38 34 - [id number] mg/dL  Folic Acid  Result Value Ref Range   Folate 10.6 >5.9 ng/mL  ANA w/Reflex  Result Value Ref Range   Anit Nuclear Antibody(ANA) Negative Negative  DAT, polyspecific, AHG ([facility])  Result Value Ref Range   Polyspecific AHG test NEG      Assessment:  Abnormal uterine bleeding Fibroid uterus Anemia Fatigue Obesity  Plan:   1. Abnormal uterine bleeding with fibroid uterus- endometrial biopsy performed today (see procedure note below).  Patient tolerated procedure well.  Discussed management options for abnormal uterine bleeding, including tranexamic acid (Lysteda), oral progesterone (Megace), Depo Provera, Mirena IUD, endometrial ablation (Novasure/Hydrothermal Ablation with hysteroscopic fibroid resection) or hysterectomy as definitive surgical management.  Discussed risks and  benefits of each method.   Patient desires surgery (hysterectomy), but in March or April (due to job).  Patient would be a candidate for robotic hysterectomy. Will have patient return in 3 months for pre-op.  2. Anemia - due to abnormal uterine bleeding.  Patient s/p blood transfusion ~ 1 month ago for symptomatic anemia. Was discharged home with PO iron therapy but noted no improvement on follow up visit.  Referred to [facility] for iron infusions.  Has had 1st treatment with improvement of anemia.  For next treatment in 1 week.  3. Fatigue - patient still noting fatigue.  Review of labs notes that patient has Vitamin  B12 deficiency.  B12 injection given today.  Patient will return weekly for injections until levels return to normal. Will also check Vitamin D levels.  4. Obesity - patient desires to discuss weight loss management.  Discussed that she could be a candidate for bariatric surgery, but she declines consultation.  Will refer to Nutritionist to aid with weight loss, and desires to discuss use of weight loss medication. Will refer to [facility], CNM.     Endometrial Biopsy Procedure Note  The patient is positioned on the exam table in the dorsal lithotomy position. Bimanual exam confirms uterine position and size. A Graves speculum is placed into the vagina. A single toothed tenaculum is placed onto the anterior lip of the cervix. The pipette is placed into the endocervical canal and is advanced to the uterine fundus. Using a piston like technique, with vacuum created by withdrawing the stylus, the endometrial specimen is obtained and transferred to the biopsy container. Minimal bleeding is encountered. The procedure is well tolerated.   Uterine Position: mid    Uterine Length: 7 cm   Uterine Specimen: Average   Post procedure instructions are given. The patient is scheduled for follow up appointment.    Rubie Maid, MD [facility]

## 2016-08-30 ENCOUNTER — Inpatient Hospital Stay: Payer: Commercial Managed Care - HMO | Attending: Oncology

## 2016-08-30 ENCOUNTER — Inpatient Hospital Stay: Payer: Commercial Managed Care - HMO

## 2016-08-30 VITALS — BP 103/67 | HR 64 | Temp 98.0°F | Resp 18

## 2016-08-30 DIAGNOSIS — Z79899 Other long term (current) drug therapy: Secondary | ICD-10-CM | POA: Diagnosis not present

## 2016-08-30 DIAGNOSIS — E538 Deficiency of other specified B group vitamins: Secondary | ICD-10-CM | POA: Diagnosis not present

## 2016-08-30 DIAGNOSIS — N920 Excessive and frequent menstruation with regular cycle: Secondary | ICD-10-CM | POA: Diagnosis not present

## 2016-08-30 DIAGNOSIS — D500 Iron deficiency anemia secondary to blood loss (chronic): Secondary | ICD-10-CM | POA: Insufficient documentation

## 2016-08-30 LAB — HEMOGLOBIN AND HEMATOCRIT, BLOOD
Hematocrit: 29.6 % — ABNORMAL LOW (ref 34.0–46.6)
Hemoglobin: 9.4 g/dL — ABNORMAL LOW (ref 11.1–15.9)

## 2016-08-30 LAB — VITAMIN D 25 HYDROXY (VIT D DEFICIENCY, FRACTURES): Vit D, 25-Hydroxy: 13.3 ng/mL — ABNORMAL LOW (ref 30.0–100.0)

## 2016-08-30 MED ORDER — SODIUM CHLORIDE 0.9 % IV SOLN
Freq: Once | INTRAVENOUS | Status: AC
  Administered 2016-08-30: 14:00:00 via INTRAVENOUS
  Filled 2016-08-30: qty 1000

## 2016-08-30 MED ORDER — SODIUM CHLORIDE 0.9 % IV SOLN
510.0000 mg | Freq: Once | INTRAVENOUS | Status: AC
  Administered 2016-08-30: 510 mg via INTRAVENOUS
  Filled 2016-08-30: qty 17

## 2016-08-30 NOTE — Patient Instructions (Signed)
Ferumoxytol injection What is this medicine? FERUMOXYTOL is an iron complex. Iron is used to make healthy red blood cells, which carry oxygen and nutrients throughout the body. This medicine is used to treat iron deficiency anemia in people with chronic kidney disease. COMMON BRAND NAME(S): Feraheme What should I tell my health care provider before I take this medicine? They need to know if you have any of these conditions: -anemia not caused by low iron levels -high levels of iron in the blood -magnetic resonance imaging (MRI) test scheduled -an unusual or allergic reaction to iron, other medicines, foods, dyes, or preservatives -pregnant or trying to get pregnant -breast-feeding How should I use this medicine? This medicine is for injection into a vein. It is given by a health care professional in a hospital or clinic setting. Talk to your pediatrician regarding the use of this medicine in children. Special care may be needed. What if I miss a dose? It is important not to miss your dose. Call your doctor or health care professional if you are unable to keep an appointment. What may interact with this medicine? This medicine may interact with the following medications: -other iron products What should I watch for while using this medicine? Visit your doctor or healthcare professional regularly. Tell your doctor or healthcare professional if your symptoms do not start to get better or if they get worse. You may need blood work done while you are taking this medicine. You may need to follow a special diet. Talk to your doctor. Foods that contain iron include: whole grains/cereals, dried fruits, beans, or peas, leafy green vegetables, and organ meats (liver, kidney). What side effects may I notice from receiving this medicine? Side effects that you should report to your doctor or health care professional as soon as possible: -allergic reactions like skin rash, itching or hives, swelling of the  face, lips, or tongue -breathing problems -changes in blood pressure -feeling faint or lightheaded, falls -fever or chills -flushing, sweating, or hot feelings -swelling of the ankles or feet Side effects that usually do not require medical attention (report to your doctor or health care professional if they continue or are bothersome): -diarrhea -headache -nausea, vomiting -stomach pain Where should I keep my medicine? This drug is given in a hospital or clinic and will not be stored at home.  2017 Elsevier/Gold Standard (2015-09-15 12:41:49)  

## 2016-08-30 NOTE — Progress Notes (Signed)
Nutrition Assessment   Reason for Assessment:  Patient interested in weight loss.  ASSESSMENT:  46 year old female with anemia likely due to heavy menses. Past medical history of uterine fibroids, morbid obesity  Patient interested in weight loss tips and techniques.  Patient seen in infusion this pm.  Reports that she is planning on see RD with [facility] in the next few weeks.  Reports that she does not want bariatric surgery.    Has changed jobs recently where she is walking more and has already seen some weight loss.     Medications: reviewed  Labs: Hgb 9.4  Anthropometrics:   Height: 63 inches Weight: 251 lb  BMI: 44   NUTRITION DIAGNOSIS: Food and nutrition related knowledge deficit related to weight loss as evidenced by patient unsure strategies to lose weight    INTERVENTION:   Discussed weight loss tips/stratagies. Handout provided.   Patient discussed emotional eating and we discussed ways to handle this eating behavior in order for patient to reach weight loss goals Encouraged safe exercise with MD approval in addition to cutting calories to help her reach weight loss goals.   Encouraged patient to keep upcoming appointment with outpatient RD to provide further assistance.      MONITORING, EVALUATION, GOAL: Patient will use weight loss strategies to help reach weight loss goals.   NEXT VISIT: Patient to call if needed.  To follow-up with outpatient RD for weight management vs oncology RD in clinic  [place]. Zenia Resides, [place], [place] (pager)

## 2016-09-03 ENCOUNTER — Telehealth: Payer: Self-pay

## 2016-09-03 DIAGNOSIS — E559 Vitamin D deficiency, unspecified: Secondary | ICD-10-CM

## 2016-09-03 MED ORDER — VITAMIN D (ERGOCALCIFEROL) 1.25 MG (50000 UNIT) PO CAPS: 50000.0000 [IU] | ORAL_CAPSULE | ORAL | 2 refills | Status: DC

## 2016-09-03 NOTE — Telephone Encounter (Signed)
Called pt no answer. LM for pt letting her know information below. RX sent in.

## 2016-09-03 NOTE — Telephone Encounter (Signed)
-----   Message from Rubie Maid, MD sent at 08/31/2016  9:39 AM EST ----- Please inform patient of Vitamin D deficiency.  Also needs to begin Vit D supplementation, 50,000 IU weekly x 12 weeks (to prescribe), followed by 1000 IU daily (OTC).  We will recheck her Vit D levels in 3 months .

## 2016-09-04 LAB — PATHOLOGY

## 2016-09-05 ENCOUNTER — Ambulatory Visit (INDEPENDENT_AMBULATORY_CARE_PROVIDER_SITE_OTHER): Payer: Commercial Managed Care - HMO | Admitting: Obstetrics and Gynecology

## 2016-09-05 VITALS — BP 123/67 | HR 61 | Wt 259.0 lb

## 2016-09-05 DIAGNOSIS — E6609 Other obesity due to excess calories: Secondary | ICD-10-CM

## 2016-09-05 DIAGNOSIS — Z6841 Body Mass Index (BMI) 40.0 and over, adult: Secondary | ICD-10-CM

## 2016-09-05 DIAGNOSIS — N939 Abnormal uterine and vaginal bleeding, unspecified: Secondary | ICD-10-CM

## 2016-09-05 DIAGNOSIS — E538 Deficiency of other specified B group vitamins: Secondary | ICD-10-CM | POA: Diagnosis not present

## 2016-09-05 DIAGNOSIS — IMO0001 Reserved for inherently not codable concepts without codable children: Secondary | ICD-10-CM

## 2016-09-05 MED ORDER — DOXYCYCLINE HYCLATE 100 MG PO TABS: 100.0000 mg | ORAL_TABLET | Freq: Two times a day (BID) | ORAL | 0 refills | Status: AC

## 2016-09-05 MED ORDER — MEDROXYPROGESTERONE ACETATE 10 MG PO TABS: ORAL_TABLET | ORAL | 0 refills | Status: DC

## 2016-09-05 MED ORDER — CYANOCOBALAMIN 1000 MCG/ML IJ SOLN: 1000.0000 ug | Freq: Once | INTRAMUSCULAR | 10 refills | Status: AC

## 2016-09-05 MED ORDER — CYANOCOBALAMIN 1000 MCG/ML IJ SOLN
1000.0000 ug | Freq: Once | INTRAMUSCULAR | Status: AC
  Administered 2016-09-05: 1000 ug via INTRAMUSCULAR

## 2016-09-05 NOTE — Progress Notes (Signed)
Patient ID: Sharon Marquez, female   DOB: 1971-02-28, 46 y.o.   MRN: [id number] Pt presents for 2nd B-12 injection for Vit. B-12 deficiency. Pt c/o heavy bleeding since last Tuesday. Dr. Marcelline Mates wants pt to take provera 20mg  daily until bleeding decreases and then go back to 10mg . Daily. Also to take Doxycyline 100mg  bid x7d. Pt aware and sent orders to pharmacy.

## 2016-09-07 IMAGING — CR DG CHEST 2V
2 series · 2 of 2 positions shown · non-contrast
Comparison: 03/08/2015 and earlier.

CLINICAL DATA: 44-year-old female with chest pain and shortness of
Breath for 5 days. Initial encounter.

EXAM:
CHEST  2 VIEW

[chest pa]
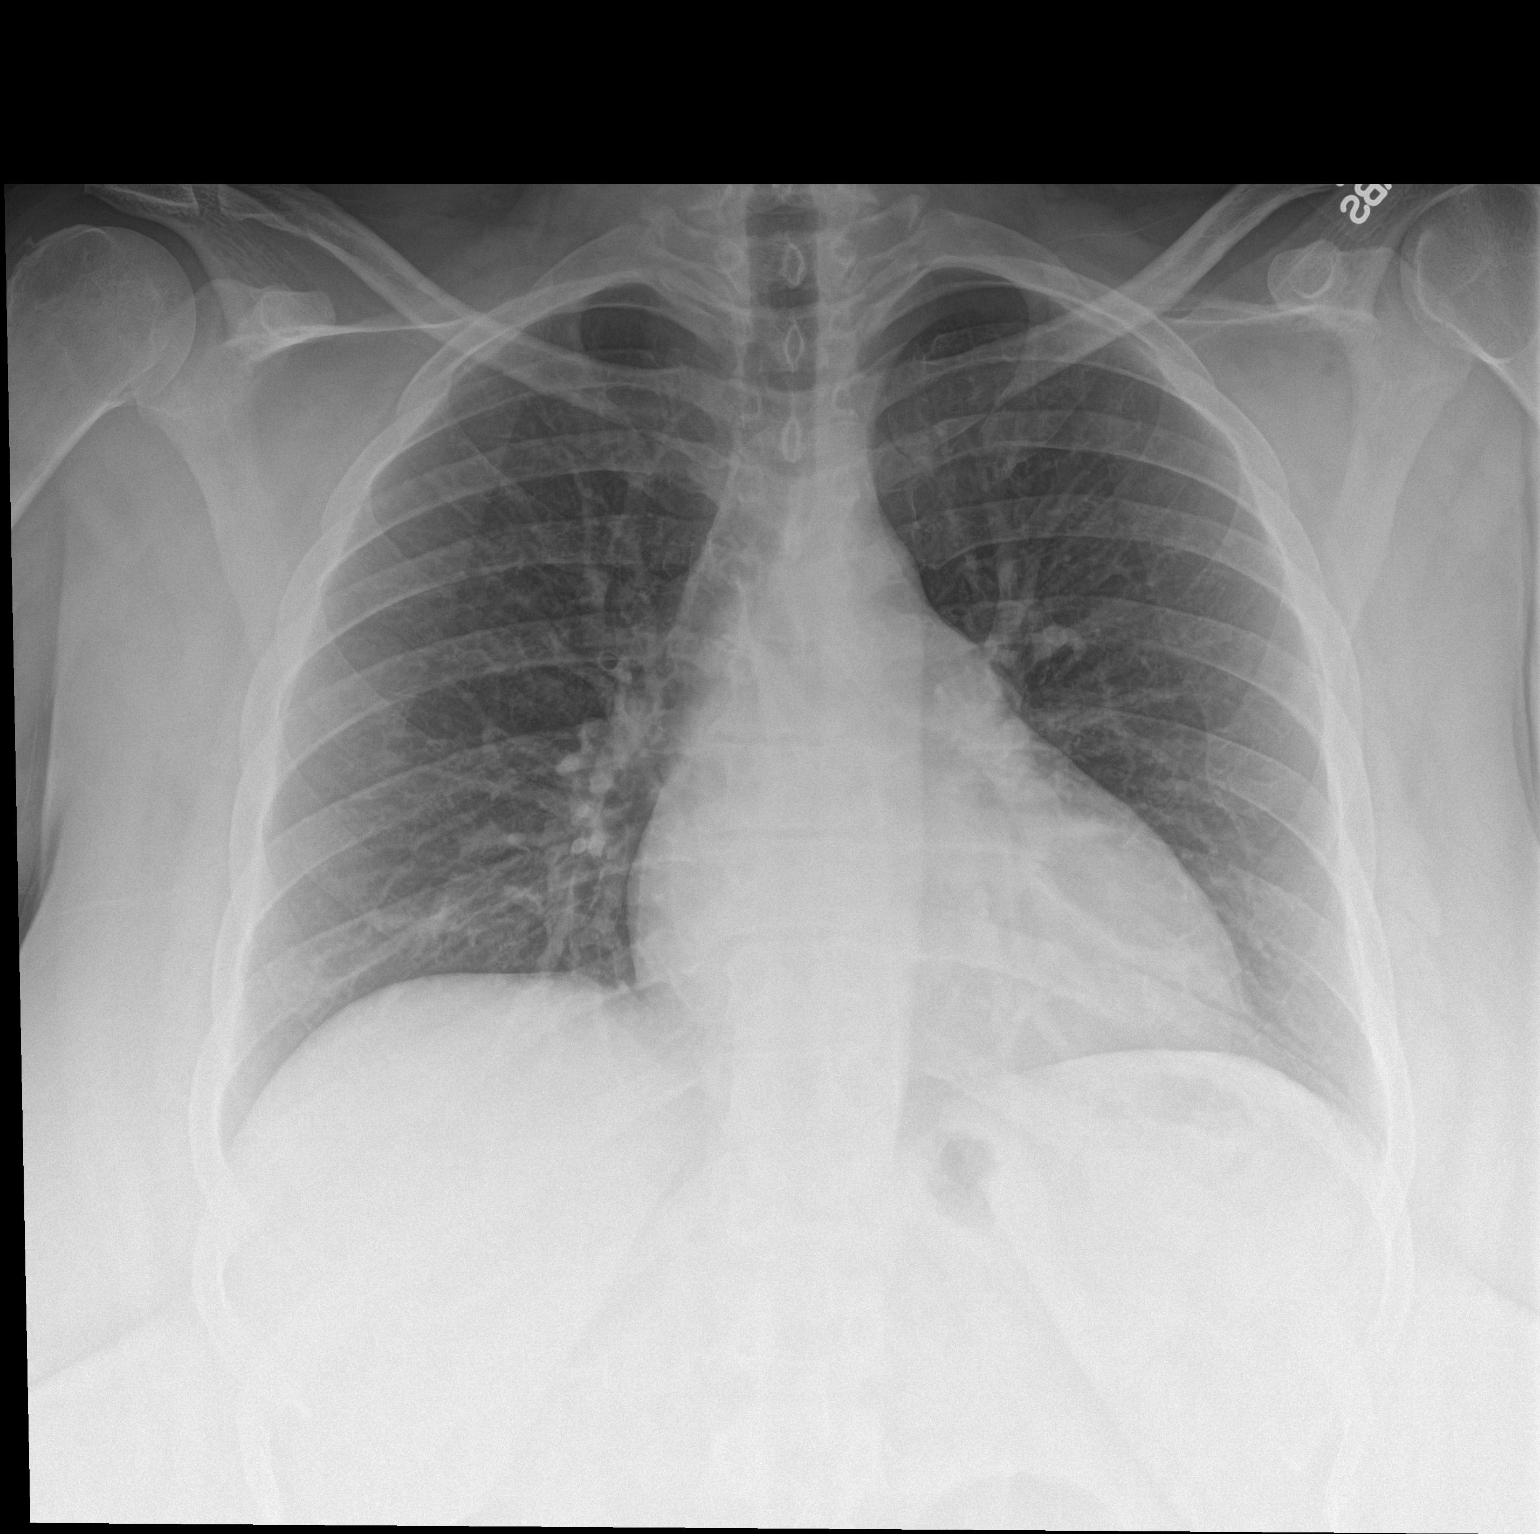

[chest lat]
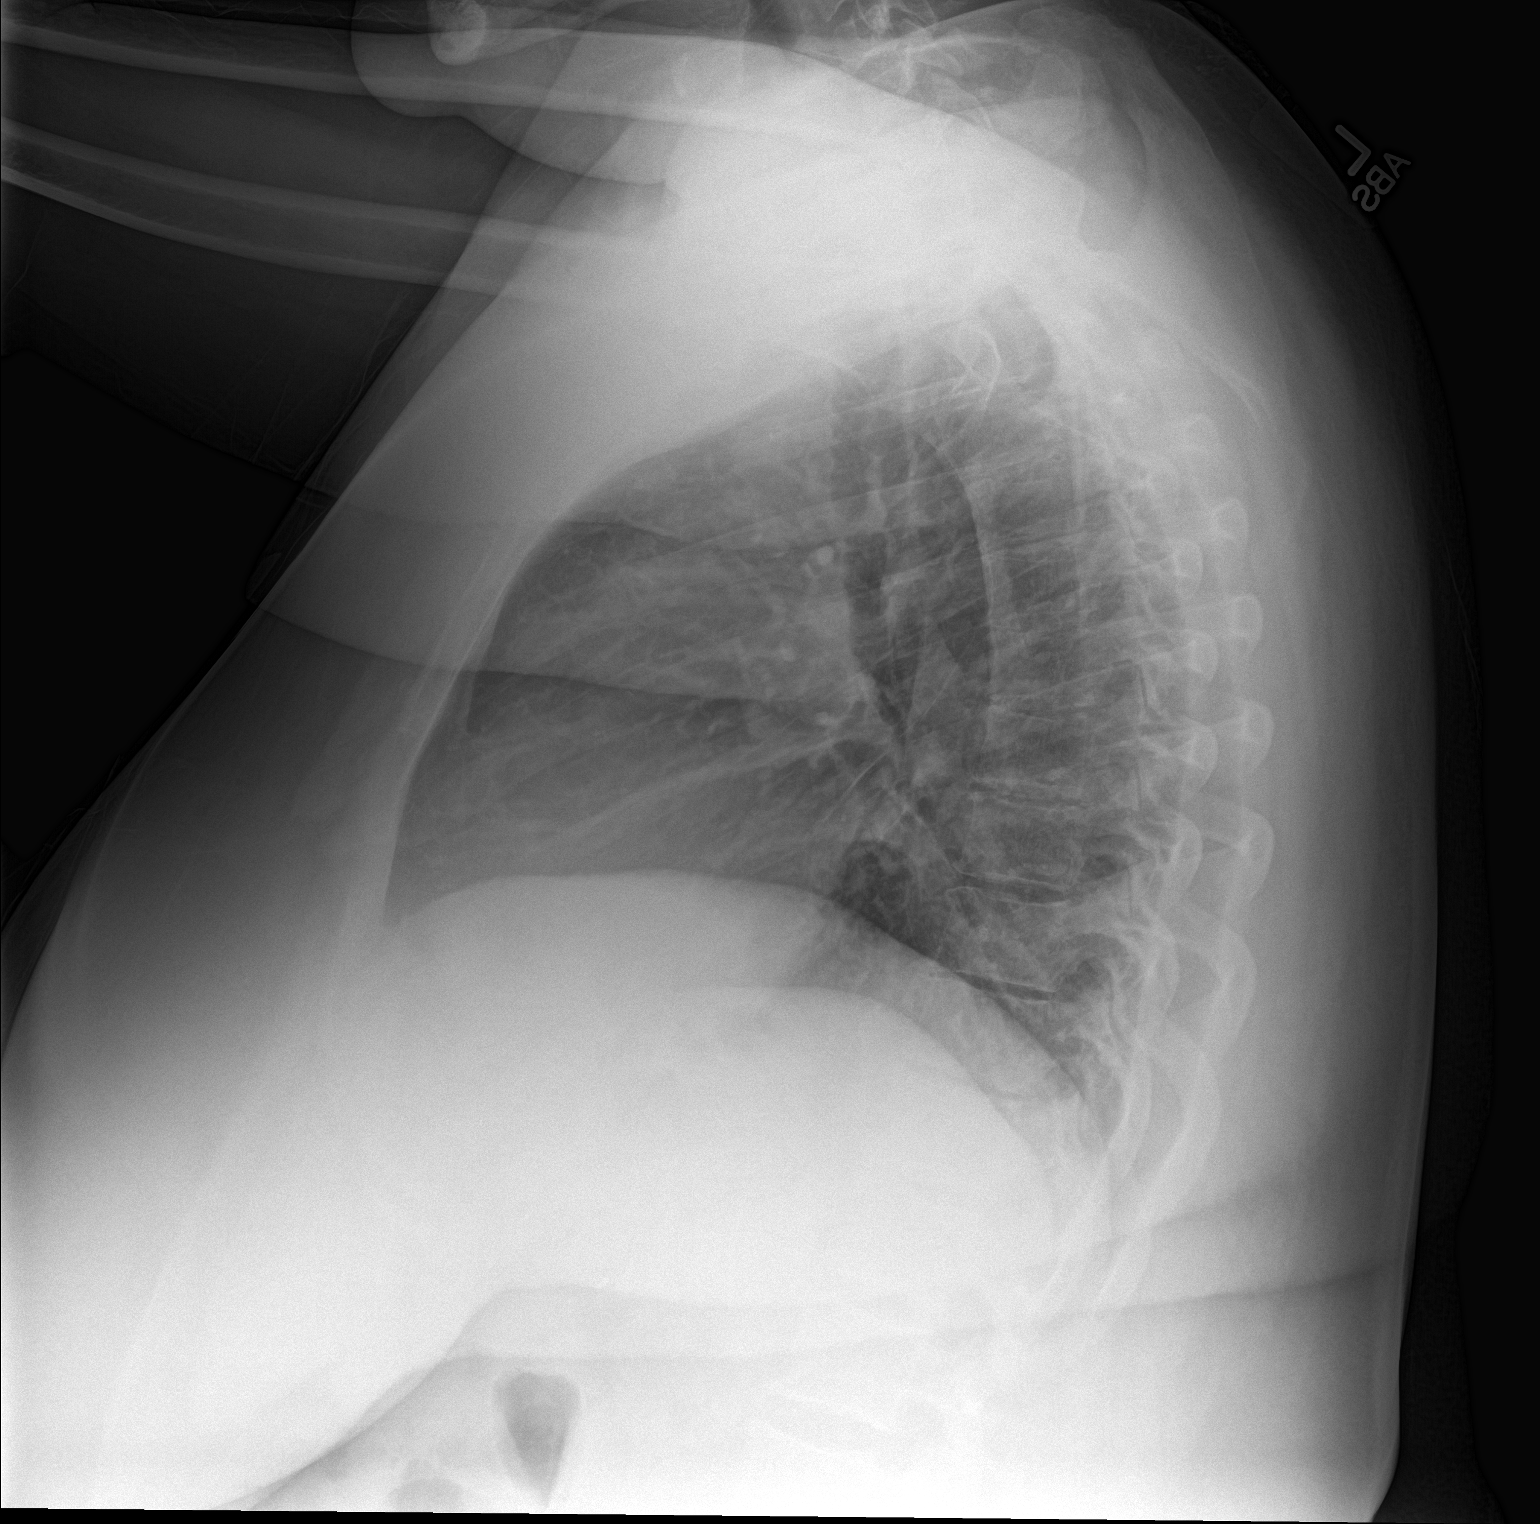

[2 of 2 positions shown; findings below may reference images not displayed]

FINDINGS: Large body habitus. Lung volumes are stable and within normal
limits. Stable cardiac size at the upper limits of normal. Other
mediastinal contours are within normal limits. Visualized tracheal
air column is within normal limits. The lungs remain clear. No
pneumothorax or pleural effusion. Stable cholecystectomy clips. No
acute osseous abnormality identified.
IMPRESSION: No acute cardiopulmonary abnormality.

## 2016-09-08 NOTE — Progress Notes (Signed)
I have discussed this patient with the nurse, Martie Lee, LPN.  I agree with management plan as written. Patient with h/o abnormal uterine bleeding. Now with heavy bleeding. Started taking Provera tablets 2-3 days ago, advised on doubling dose for 2-3 days (20 mg), then decreasing down to 1 tablet. Also reviewed pathology report of recent EMB, noted chronic endometritis.  Will treat with Doxycyline 100 mg BID x 7 days.

## 2016-09-12 ENCOUNTER — Ambulatory Visit: Payer: Commercial Managed Care - HMO

## 2016-09-19 ENCOUNTER — Ambulatory Visit (INDEPENDENT_AMBULATORY_CARE_PROVIDER_SITE_OTHER): Payer: Commercial Managed Care - HMO | Admitting: Obstetrics and Gynecology

## 2016-09-19 ENCOUNTER — Encounter: Payer: Self-pay | Admitting: Obstetrics and Gynecology

## 2016-09-19 VITALS — BP 148/80 | HR 66 | Ht 63.0 in | Wt 255.8 lb

## 2016-09-19 DIAGNOSIS — R5383 Other fatigue: Secondary | ICD-10-CM | POA: Diagnosis not present

## 2016-09-19 MED ORDER — CYANOCOBALAMIN 1000 MCG/ML IJ SOLN
1000.0000 ug | Freq: Once | INTRAMUSCULAR | Status: AC
  Administered 2016-09-19: 1000 ug via INTRAMUSCULAR

## 2016-09-19 MED ORDER — PHENTERMINE HCL 37.5 MG PO TABS: 37.5000 mg | ORAL_TABLET | Freq: Every day | ORAL | 2 refills | Status: DC

## 2016-09-19 NOTE — Patient Instructions (Signed)

## 2016-09-19 NOTE — Progress Notes (Signed)
Subjective:  Sharon Marquez is a 46 y.o. G1P0101 at Unknown being seen today for weight loss management- initial visit.  Patient reports General ROS: negative and reports previous weight loss attempts: none to date, as she works 4 days a week 3a to 3p and usually is in bed by 6:30 daily as she gets up at 1:30a to go to work. She does walk around the entire time at work. Also her fatigue and anemia has inhibited her ability to exercise   The following portions of the patient's history were reviewed and updated as appropriate: allergies, current medications, past family history, past medical history, past social history, past surgical history and problem list.   Objective:   Vitals:   09/19/16 1601  BP: (!) 148/80  Pulse: 66  Weight: 255 lb 12.8 oz (116 kg)  Height: 5\' 3"  (1.6 m)    General:  Alert, oriented and cooperative. Patient is in no acute distress.  :   :   :   :   :   :   PE: Well groomed female in no current distress,   Mental Status: Normal mood and affect. Normal behavior. Normal judgment and thought content.   Current BMI: Body mass index is 45.31 kg/m.   Assessment and Plan:  Obesity  1. Other fatigue  - cyanocobalamin ((VITAMIN B-12)) injection 1,000 mcg; Inject 1 mL (1,000 mcg total) into the muscle once.  2. Morbid obesity ([place])    Plan: low carb, High protein diet RX for adipex 37.5 mg daily and B12 1073mcg.ml monthly, to start now with first injection given at today's visit. Reviewed side-effects common to both medications and expected outcomes. Increase daily water intake to at least 8 bottle a day, every day.  Goal is to reduse weight by 10% by end of three months, and will re-evaluate then.  RTC in 4 weeks for Nurse visit to check weight & BP, and get next B12 injections.    Please refer to After Visit Summary for other counseling recommendations.    Celestina Gironda [place], CNM   Marlowe Lawes [employer], CNM      Consider the Low Glycemic Index  Diet and 6 smaller meals daily .  This boosts your metabolism and regulates your sugars:   Use the protein bar by [employer] because they have lots of fiber in them  Find the low carb flatbreads, tortillas and pita breads for sandwiches:  [name] makes a pita bread and a flat bread , available at [facility] and [employer]; [place] makes a low carb flatbread available at [employer] and HT that is 9 net carbs and 100 cal [place] makes a low carb whole wheat tortilla available at [employer] most grocery stores with 6 net carbs and 210 cal  [place] yogurt can still have a lot of carbs .  [name] has 80 cal and 8 carbs

## 2016-09-26 ENCOUNTER — Other Ambulatory Visit: Payer: Self-pay | Admitting: Oncology

## 2016-09-27 ENCOUNTER — Inpatient Hospital Stay: Payer: Commercial Managed Care - HMO | Attending: Oncology

## 2016-09-27 VITALS — BP 104/68 | HR 70 | Temp 97.4°F | Resp 18

## 2016-09-27 DIAGNOSIS — D500 Iron deficiency anemia secondary to blood loss (chronic): Secondary | ICD-10-CM

## 2016-09-27 DIAGNOSIS — E538 Deficiency of other specified B group vitamins: Secondary | ICD-10-CM | POA: Diagnosis not present

## 2016-09-27 DIAGNOSIS — D509 Iron deficiency anemia, unspecified: Secondary | ICD-10-CM | POA: Insufficient documentation

## 2016-09-27 DIAGNOSIS — Z79899 Other long term (current) drug therapy: Secondary | ICD-10-CM | POA: Diagnosis not present

## 2016-09-27 MED ORDER — SODIUM CHLORIDE 0.9 % IV SOLN
510.0000 mg | Freq: Once | INTRAVENOUS | Status: AC
  Administered 2016-09-27: 510 mg via INTRAVENOUS
  Filled 2016-09-27: qty 17

## 2016-09-27 MED ORDER — CYANOCOBALAMIN 1000 MCG/ML IJ SOLN
1000.0000 ug | Freq: Once | INTRAMUSCULAR | Status: AC
  Administered 2016-09-27: 1000 ug via INTRAMUSCULAR
  Filled 2016-09-27: qty 1

## 2016-09-27 MED ORDER — SODIUM CHLORIDE 0.9 % IV SOLN
Freq: Once | INTRAVENOUS | Status: AC
Start: 2016-09-27 — End: 2016-09-27
  Administered 2016-09-27: 14:00:00 via INTRAVENOUS
  Filled 2016-09-27: qty 1000

## 2016-10-03 ENCOUNTER — Ambulatory Visit (INDEPENDENT_AMBULATORY_CARE_PROVIDER_SITE_OTHER): Payer: Commercial Managed Care - HMO | Admitting: Obstetrics and Gynecology

## 2016-10-03 VITALS — BP 97/60 | HR 70 | Ht 63.0 in | Wt 246.0 lb

## 2016-10-03 DIAGNOSIS — E538 Deficiency of other specified B group vitamins: Secondary | ICD-10-CM | POA: Diagnosis not present

## 2016-10-03 DIAGNOSIS — R5383 Other fatigue: Secondary | ICD-10-CM | POA: Diagnosis not present

## 2016-10-03 DIAGNOSIS — N939 Abnormal uterine and vaginal bleeding, unspecified: Secondary | ICD-10-CM

## 2016-10-03 MED ORDER — NORETHINDRONE 0.35 MG PO TABS: 1.0000 | ORAL_TABLET | Freq: Every day | ORAL | 11 refills | Status: DC

## 2016-10-03 MED ORDER — CYANOCOBALAMIN 1000 MCG/ML IJ SOLN
1000.0000 ug | Freq: Once | INTRAMUSCULAR | Status: AC
  Administered 2016-10-03: 1000 ug via INTRAMUSCULAR

## 2016-10-03 NOTE — Progress Notes (Signed)
Patient ID: Sharon Marquez, female   DOB: 1971/03/07, 46 y.o.   MRN: [id number] Pt presents for B-12 injection for chronic fatigue and AUB. Pt started weight loss program last week and has lost 9 lbs. She is feeling better.  Pt states she had to restart taking the provera back at 20 mg. Instead of 10 mg due to heavy bleeding when she decreased her dosage to 10 mg. Per Dr. Marcelline Mates pt informed to stop the provera because she cannot take it long term and start Micronor if heavy bleeding outside period-which pt states she is having. Pt also inquired about her Vitamin D medication and wondered if she was to continue taking it, that the pharmacy had another prescription for her and the answer was yes.

## 2016-10-09 ENCOUNTER — Ambulatory Visit: Payer: Commercial Managed Care - HMO

## 2016-10-10 ENCOUNTER — Ambulatory Visit (INDEPENDENT_AMBULATORY_CARE_PROVIDER_SITE_OTHER): Payer: Commercial Managed Care - HMO | Admitting: Obstetrics and Gynecology

## 2016-10-10 MED ORDER — CYANOCOBALAMIN 1000 MCG/ML IJ SOLN
1000.0000 ug | Freq: Once | INTRAMUSCULAR | Status: AC
  Administered 2016-10-10: 1000 ug via INTRAMUSCULAR

## 2016-10-10 NOTE — Progress Notes (Signed)
Pt presents for Vit b12 inj.Pt presents for  Weight,B/P, B-12 injection. No side effects of medications-Phentermine or B-12. Weight loss _4___ lbs. Encourage eating healthy and exercise.

## 2016-10-16 ENCOUNTER — Emergency Department
Admission: EM | Admit: 2016-10-16 | Discharge: 2016-10-16 | Disposition: A | Discharge status: Home / Self Care | Payer: Commercial Managed Care - HMO | Attending: Emergency Medicine | Admitting: Emergency Medicine

## 2016-10-16 ENCOUNTER — Encounter: Payer: Self-pay | Admitting: Medical Oncology

## 2016-10-16 DIAGNOSIS — R112 Nausea with vomiting, unspecified: Secondary | ICD-10-CM | POA: Diagnosis present

## 2016-10-16 DIAGNOSIS — A084 Viral intestinal infection, unspecified: Secondary | ICD-10-CM | POA: Diagnosis not present

## 2016-10-16 LAB — URINALYSIS, COMPLETE (UACMP) WITH MICROSCOPIC
Bacteria, UA: NONE SEEN
Bilirubin Urine: NEGATIVE
GLUCOSE, UA: NEGATIVE mg/dL
Hgb urine dipstick: NEGATIVE
Ketones, ur: NEGATIVE mg/dL
Leukocytes, UA: NEGATIVE
Nitrite: NEGATIVE
PH: 7 (ref 5.0–8.0)
Protein, ur: NEGATIVE mg/dL
SPECIFIC GRAVITY, URINE: 1.027 (ref 1.005–1.030)

## 2016-10-16 LAB — CBC
HCT: 42.3 % (ref 35.0–47.0)
Hemoglobin: 13.9 g/dL (ref 12.0–16.0)
MCH: 29.1 pg (ref 26.0–34.0)
MCHC: 32.9 g/dL (ref 32.0–36.0)
MCV: 88.5 fL (ref 80.0–100.0)
PLATELETS: 189 10*3/uL (ref 150–440)
RBC: 4.78 MIL/uL (ref 3.80–5.20)
RDW: 16.9 % — AB (ref 11.5–14.5)
WBC: 3.4 10*3/uL — AB (ref 3.6–11.0)

## 2016-10-16 LAB — COMPREHENSIVE METABOLIC PANEL
ALK PHOS: 51 U/L (ref 38–126)
ALT: 24 U/L (ref 14–54)
AST: 17 U/L (ref 15–41)
Albumin: 3.8 g/dL (ref 3.5–5.0)
Anion gap: 6 (ref 5–15)
BUN: 11 mg/dL (ref 6–20)
CALCIUM: 8.6 mg/dL — AB (ref 8.9–10.3)
CHLORIDE: 108 mmol/L (ref 101–111)
CO2: 24 mmol/L (ref 22–32)
CREATININE: 0.63 mg/dL (ref 0.44–1.00)
Glucose, Bld: 120 mg/dL — ABNORMAL HIGH (ref 65–99)
Potassium: 3.4 mmol/L — ABNORMAL LOW (ref 3.5–5.1)
Sodium: 138 mmol/L (ref 135–145)
Total Bilirubin: 0.3 mg/dL (ref 0.3–1.2)
Total Protein: 7.3 g/dL (ref 6.5–8.1)

## 2016-10-16 LAB — LIPASE, BLOOD: Lipase: 19 U/L (ref 11–51)

## 2016-10-16 LAB — POCT PREGNANCY, URINE: Preg Test, Ur: NEGATIVE

## 2016-10-16 MED ORDER — ONDANSETRON 4 MG PO TBDP: ORAL_TABLET | ORAL | 0 refills | Status: DC

## 2016-10-16 NOTE — ED Triage Notes (Signed)
Pt reports NVD and fever that began Saturday. Pt in NAD at this time.

## 2016-10-16 NOTE — Discharge Instructions (Signed)

## 2016-10-16 NOTE — ED Provider Notes (Signed)
[facility] Emergency Department Provider Note  ____________________________________________   First MD Initiated Contact with Patient 10/16/16 1257     (approximate)  I have reviewed the triage vital signs and the nursing notes.   HISTORY  Chief Complaint Nausea and Emesis    HPI Sharon Marquez is a 46 y.o. female with a history of chronic anemia but who is generally healthy otherwise.  She presents of evaluation of N/V/D over the last 4 days.  She states that the symptoms came on acutely and abruptly and she was vomiting and having diarrhea at the same time for about 2 days.  Over the last 2 days she has been improving steadily by today she has not had any vomiting nor diarrhea, and in general she feels much better, but she thought she should get checked out to make sure she was not dehydrated.  She denies lightheadedness, dizziness, chest pain, shortness of breath, cough, fever/chills, dysuria.  Symptoms were severe, now they are mild, and nothing in particular made them better nor worse.   Past Medical History:  Diagnosis Date  . Anemia   . Blood transfusion without reported diagnosis   . Fibroid uterus 2014  . History of blood transfusion 2014, 2015   for symptomatic anemia    Patient Active Problem List   Diagnosis Date Noted  . Morbid obesity ([place]) 08/05/2016  . Abnormal uterine bleeding 08/05/2016  . History of blood transfusion 08/02/2016  . Iron deficiency anemia 07/11/2016  . Fibroid uterus 08/27/2012    Past Surgical History:  Procedure Laterality Date  . CHOLECYSTECTOMY      Prior to Admission medications   Medication Sig Start Date End Date Taking? Authorizing Provider  docusate sodium (COLACE) 100 MG capsule Take 1 capsule (100 mg total) by mouth daily as needed for mild constipation. 07/12/16   Rubie Maid, MD  Ferrous Sulfate (SLOW FE) 142 (45 Fe) MG TBCR Take 1 tablet by mouth 3 (three) times daily. 07/12/16   Rubie Maid,  MD  norethindrone (MICRONOR,CAMILA,ERRIN) 0.35 MG tablet Take 1 tablet (0.35 mg total) by mouth daily. 10/03/16   Rubie Maid, MD  ondansetron (ZOFRAN ODT) 4 MG disintegrating tablet Allow 1-2 tablets to dissolve in your mouth every 8 hours as needed for nausea/vomiting 10/16/16   Hinda Kehr, MD  phentermine (ADIPEX-P) 37.5 MG tablet Take 1 tablet (37.5 mg total) by mouth daily before breakfast. 09/19/16   Melody N Shambley, CNM  Vitamin D, Ergocalciferol, (DRISDOL) 50000 units CAPS capsule Take 1 capsule (50,000 Units total) by mouth every 7 (seven) days. 09/03/16   Rubie Maid, MD    Allergies Meloxicam and Tramadol  Family History  Problem Relation Age of Onset  . Hyperthyroidism Mother   . Hyperthyroidism Maternal Aunt   . Diabetes Maternal Grandmother     Social History Social History  Substance Use Topics  . Smoking status: Never Smoker  . Smokeless tobacco: Never Used  . Alcohol use No    Review of Systems Constitutional: No fever/chills Eyes: No visual changes. ENT: No sore throat. Cardiovascular: Denies chest pain. Respiratory: Denies shortness of breath. Gastrointestinal: No abdominal pain.  N/V/D x 4 days, now much improved Genitourinary: Negative for dysuria. Musculoskeletal: Negative for back pain. Skin: Negative for rash. Neurological: Negative for headaches, focal weakness or numbness.  10-point ROS otherwise negative.  ____________________________________________   PHYSICAL EXAM:  VITAL SIGNS: ED Triage Vitals  Enc Vitals Group     BP 10/16/16 1039 120/83  Pulse Rate 10/16/16 1039 71     Resp 10/16/16 1039 19     Temp 10/16/16 1039 98.8 F (37.1 C)     Temp Source 10/16/16 1039 Oral     SpO2 10/16/16 1039 99 %     Weight 10/16/16 1040 242 lb (109.8 kg)     Height 10/16/16 1040 5\' 3"  (1.6 m)     Head Circumference --      Peak Flow --      Pain Score 10/16/16 1040 0     Pain Loc --      Pain Edu? --      Excl. in [place]? --      Constitutional: Alert and oriented. Well appearing and in no acute distress. Eyes: Conjunctivae are normal. PERRL. EOMI. Head: Atraumatic. Nose: No congestion/rhinnorhea. Mouth/Throat: Mucous membranes are moist. Neck: No stridor.  No meningeal signs.   Cardiovascular: Normal rate, regular rhythm. Good peripheral circulation. Grossly normal heart sounds. Respiratory: Normal respiratory effort.  No retractions. Lungs CTAB. Gastrointestinal: Morbid obesity.  Soft and nontender. No distention.  Musculoskeletal: No lower extremity tenderness nor edema. No gross deformities of extremities. Neurologic:  Normal speech and language. No gross focal neurologic deficits are appreciated.  Skin:  Skin is warm, dry and intact. No rash noted. Psychiatric: Mood and affect are normal. Speech and behavior are normal.  ____________________________________________   LABS (all labs ordered are listed, but only abnormal results are displayed)  Labs Reviewed  COMPREHENSIVE METABOLIC PANEL - Abnormal; Notable for the following:       Result Value   Potassium 3.4 (*)    Glucose, Bld 120 (*)    Calcium 8.6 (*)    All other components within normal limits  CBC - Abnormal; Notable for the following:    WBC 3.4 (*)    RDW 16.9 (*)    All other components within normal limits  URINALYSIS, COMPLETE (UACMP) WITH MICROSCOPIC - Abnormal; Notable for the following:    Color, Urine YELLOW (*)    APPearance HAZY (*)    Squamous Epithelial / LPF 0-5 (*)    All other components within normal limits  LIPASE, BLOOD  POC URINE PREG, ED  POCT PREGNANCY, URINE   ____________________________________________  EKG  None - EKG not ordered by ED physician ____________________________________________  RADIOLOGY   No results found.  ____________________________________________   PROCEDURES  Procedure(s) performed:   Procedures   Critical Care performed:  No ____________________________________________   INITIAL IMPRESSION / ASSESSMENT AND PLAN / ED COURSE  Pertinent labs & imaging results that were available during my care of the patient were reviewed by me and considered in my medical decision making (see chart for details).  The patient is well-appearing and in no acute distress with normal vital signs.  She is not anemic which, given her history, may actually indicate volume depletion, but she is asymptomatic.  She is tolerating oral intake.  I encouraged outpatient follow-up as needed but she does not have any evidence of acute or emergent medical problem at this time.      ____________________________________________  FINAL CLINICAL IMPRESSION(S) / ED DIAGNOSES  Final diagnoses:  Viral gastroenteritis     MEDICATIONS GIVEN DURING THIS VISIT:  Medications - No data to display   NEW OUTPATIENT MEDICATIONS STARTED DURING THIS VISIT:  New Prescriptions   ONDANSETRON (ZOFRAN ODT) 4 MG DISINTEGRATING TABLET    Allow 1-2 tablets to dissolve in your mouth every 8 hours as needed for nausea/vomiting  Modified Medications   No medications on file    Discontinued Medications   No medications on file     Note:  This document was prepared using Dragon voice recognition software and may include unintentional dictation errors.    Hinda Kehr, MD 10/16/16 1308

## 2016-10-17 ENCOUNTER — Ambulatory Visit (INDEPENDENT_AMBULATORY_CARE_PROVIDER_SITE_OTHER): Payer: Commercial Managed Care - HMO | Admitting: Obstetrics and Gynecology

## 2016-10-17 MED ORDER — CYANOCOBALAMIN 1000 MCG/ML IJ SOLN
1000.0000 ug | Freq: Once | INTRAMUSCULAR | Status: AC
  Administered 2016-10-17: 1000 ug via INTRAMUSCULAR

## 2016-10-17 NOTE — Progress Notes (Signed)
Pt presents for  Weight,B/P, B-12 injection. No side effects of medications-Phentermine or B-12. Weight loss __2__ lbs. Encourage eating healthy and exercise. 

## 2016-10-22 DIAGNOSIS — E538 Deficiency of other specified B group vitamins: Secondary | ICD-10-CM | POA: Insufficient documentation

## 2016-10-22 NOTE — Progress Notes (Signed)
[name]  [phone] [phone]) [phone]  ID: Sharon Marquez OB: 03/04/1971  MR#: [id number]  [id number]  Patient Care Team: No Pcp Per Patient as PCP - General (General Practice)  CHIEF COMPLAINT: Iron deficiency anemia, B-12 deficiency.  INTERVAL HISTORY: Patient returns to clinic today for repeat laboratory work and further evaluation. She currently feels well and is asymptomatic. She does not complain of weakness or fatigue today. She has no neurologic complaints. She does not complain of any further heavy menses. She denies any recent fevers or illnesses. She has a good appetite and denies weight loss. She has no chest pain or shortness of breath. She denies any nausea, vomiting, constipation, or diarrhea. She has no melena or hematochezia. She has no urinary complaints. Patient offers no specific complaints today.  REVIEW OF SYSTEMS:   Review of Systems  Constitutional: Negative for fever, malaise/fatigue and weight loss.  Respiratory: Negative.  Negative for cough and shortness of breath.   Cardiovascular: Negative.  Negative for chest pain and leg swelling.  Gastrointestinal: Negative.  Negative for abdominal pain, blood in stool and melena.  Genitourinary: Negative.  Negative for hematuria.  Neurological: Negative for weakness.  Psychiatric/Behavioral: Negative.  The patient is not nervous/anxious.     As per HPI. Otherwise, a complete review of systems is negative.  PAST MEDICAL HISTORY: Past Medical History:  Diagnosis Date  . Anemia   . Blood transfusion without reported diagnosis   . Fibroid uterus 2014  . History of blood transfusion 2014, 2015   for symptomatic anemia    PAST SURGICAL HISTORY: Past Surgical History:  Procedure Laterality Date  . CHOLECYSTECTOMY      FAMILY HISTORY: Family History  Problem Relation Age of Onset  . Hyperthyroidism Mother   . Hyperthyroidism Maternal Aunt   . Diabetes Maternal  Grandmother     ADVANCED DIRECTIVES (Y/N):  N  HEALTH MAINTENANCE: Social History  Substance Use Topics  . Smoking status: Never Smoker  . Smokeless tobacco: Never Used  . Alcohol use No     Colonoscopy:  PAP:  Bone density:  Lipid panel:  Allergies  Allergen Reactions  . Meloxicam Itching  . Tramadol Itching and Swelling    Current Outpatient Prescriptions  Medication Sig Dispense Refill  . docusate sodium (COLACE) 100 MG capsule Take 1 capsule (100 mg total) by mouth daily as needed for mild constipation. 30 capsule 3  . Ferrous Sulfate (SLOW FE) 142 (45 Fe) MG TBCR Take 1 tablet by mouth 3 (three) times daily. 90 tablet 6  . norethindrone (MICRONOR,CAMILA,ERRIN) 0.35 MG tablet Take 1 tablet (0.35 mg total) by mouth daily. 1 Package 11  . ondansetron (ZOFRAN ODT) 4 MG disintegrating tablet Allow 1-2 tablets to dissolve in your mouth every 8 hours as needed for nausea/vomiting 30 tablet 0  . phentermine (ADIPEX-P) 37.5 MG tablet Take 1 tablet (37.5 mg total) by mouth daily before breakfast. 30 tablet 2  . Vitamin D, Ergocalciferol, (DRISDOL) 50000 units CAPS capsule Take 1 capsule (50,000 Units total) by mouth every 7 (seven) days. 7 capsule 2   No current facility-administered medications for this visit.     OBJECTIVE: Vitals:   10/23/16 1432  BP: 127/82  Pulse: 76  Resp: 18  Temp: 97.8 F (36.6 C)     Body mass index is 42.47 kg/m.    ECOG FS:1 - Symptomatic but completely ambulatory  General: Well-developed, well-nourished, no acute distress. Eyes: Pink conjunctiva, anicteric sclera. Lungs: Clear to auscultation  bilaterally. Heart: Regular rate and rhythm. No rubs, murmurs, or gallops. Abdomen: Soft, nontender, nondistended. No organomegaly noted, normoactive bowel sounds. Musculoskeletal: No edema, cyanosis, or clubbing. Neuro: Alert, answering all questions appropriately. Cranial nerves grossly intact. Skin: No rashes or petechiae noted. Psych: Normal  affect.  LAB RESULTS:  Lab Results  Component Value Date   NA 138 10/16/2016   K 3.4 (L) 10/16/2016   CL 108 10/16/2016   CO2 24 10/16/2016   GLUCOSE 120 (H) 10/16/2016   BUN 11 10/16/2016   CREATININE 0.63 10/16/2016   CALCIUM 8.6 (L) 10/16/2016   PROT 7.3 10/16/2016   ALBUMIN 3.8 10/16/2016   AST 17 10/16/2016   ALT 24 10/16/2016   ALKPHOS 51 10/16/2016   BILITOT 0.3 10/16/2016   GFRNONAA >60 10/16/2016   GFRAA >60 10/16/2016    Lab Results  Component Value Date   WBC 3.7 10/23/2016   NEUTROABS 1.7 10/23/2016   HGB 13.1 10/23/2016   HCT 39.4 10/23/2016   MCV 87.5 10/23/2016   PLT 165 10/23/2016   Lab Results  Component Value Date   IRON 64 10/23/2016   TIBC 307 10/23/2016   IRONPCTSAT 21 10/23/2016   Lab Results  Component Value Date   FERRITIN 88 10/23/2016     STUDIES: No results found.  ASSESSMENT: Iron deficiency anemia, B-12 deficiency.  PLAN:    1. Iron deficiency anemia: Patient's hemoglobin And iron stores are now within normal limits. Other than a decreased B-12 level, the remainder of her laboratory work was either negative or within normal limits. Patient does not require additional IV Feraheme today. Return to clinic in 3 months with repeat laboratory work and further evaluation. 2. B-12 deficiency: Continue B-12 injections as indicated.  3. Heavy menses: Improved. Continue treatment per OB/GYN.  Patient expressed understanding and was in agreement with this plan. She also understands that She can call clinic at any time with any questions, concerns, or complaints.    Lloyd Huger, MD   10/27/2016 8:22 AM

## 2016-10-23 ENCOUNTER — Inpatient Hospital Stay: Payer: Commercial Managed Care - HMO

## 2016-10-23 ENCOUNTER — Inpatient Hospital Stay (HOSPITAL_BASED_OUTPATIENT_CLINIC_OR_DEPARTMENT_OTHER): Payer: Commercial Managed Care - HMO | Admitting: Oncology

## 2016-10-23 VITALS — BP 127/82 | HR 76 | Temp 97.8°F | Resp 18 | Wt 239.8 lb

## 2016-10-23 DIAGNOSIS — D508 Other iron deficiency anemias: Secondary | ICD-10-CM

## 2016-10-23 DIAGNOSIS — D500 Iron deficiency anemia secondary to blood loss (chronic): Secondary | ICD-10-CM

## 2016-10-23 DIAGNOSIS — Z79899 Other long term (current) drug therapy: Secondary | ICD-10-CM | POA: Diagnosis not present

## 2016-10-23 DIAGNOSIS — D509 Iron deficiency anemia, unspecified: Secondary | ICD-10-CM

## 2016-10-23 DIAGNOSIS — E538 Deficiency of other specified B group vitamins: Secondary | ICD-10-CM

## 2016-10-23 LAB — IRON AND TIBC
IRON: 64 ug/dL (ref 28–170)
SATURATION RATIOS: 21 % (ref 10.4–31.8)
TIBC: 307 ug/dL (ref 250–450)
UIBC: 243 ug/dL

## 2016-10-23 LAB — VITAMIN B12: VITAMIN B 12: 604 pg/mL (ref 180–914)

## 2016-10-23 LAB — CBC WITH DIFFERENTIAL/PLATELET
BASOS PCT: 1 %
Basophils Absolute: 0 10*3/uL (ref 0–0.1)
EOS ABS: 0 10*3/uL (ref 0–0.7)
Eosinophils Relative: 0 %
HEMATOCRIT: 39.4 % (ref 35.0–47.0)
Hemoglobin: 13.1 g/dL (ref 12.0–16.0)
Lymphocytes Relative: 45 %
Lymphs Abs: 1.7 10*3/uL (ref 1.0–3.6)
MCH: 29 pg (ref 26.0–34.0)
MCHC: 33.1 g/dL (ref 32.0–36.0)
MCV: 87.5 fL (ref 80.0–100.0)
Monocytes Absolute: 0.3 10*3/uL (ref 0.2–0.9)
Monocytes Relative: 8 %
NEUTROS ABS: 1.7 10*3/uL (ref 1.4–6.5)
NEUTROS PCT: 46 %
Platelets: 165 10*3/uL (ref 150–440)
RBC: 4.51 MIL/uL (ref 3.80–5.20)
RDW: 15.8 % — ABNORMAL HIGH (ref 11.5–14.5)
WBC: 3.7 10*3/uL (ref 3.6–11.0)

## 2016-10-23 LAB — FERRITIN: Ferritin: 88 ng/mL (ref 11–307)

## 2016-10-23 MED ORDER — CYANOCOBALAMIN 1000 MCG/ML IJ SOLN
1000.0000 ug | Freq: Once | INTRAMUSCULAR | Status: AC
  Administered 2016-10-23: 1000 ug via INTRAMUSCULAR
  Filled 2016-10-23: qty 1

## 2016-10-23 NOTE — Progress Notes (Signed)
Complains of feeling tired today. 

## 2016-11-12 ENCOUNTER — Ambulatory Visit (INDEPENDENT_AMBULATORY_CARE_PROVIDER_SITE_OTHER): Payer: Commercial Managed Care - HMO | Admitting: Obstetrics and Gynecology

## 2016-11-12 DIAGNOSIS — R5383 Other fatigue: Secondary | ICD-10-CM

## 2016-11-12 DIAGNOSIS — E538 Deficiency of other specified B group vitamins: Secondary | ICD-10-CM | POA: Diagnosis not present

## 2016-11-12 MED ORDER — CYANOCOBALAMIN 1000 MCG/ML IJ SOLN
1000.0000 ug | Freq: Once | INTRAMUSCULAR | Status: AC
  Administered 2016-11-12: 1000 ug via INTRAMUSCULAR

## 2016-11-12 NOTE — Progress Notes (Signed)
Patient ID: Sharon Marquez, female   DOB: 1971-06-11, 46 y.o.   MRN: [id number] Pt presents for B-12 injection, for vitamin B-12 diffencey, chronic fatigue. Pt feeling better, drinking lots of water and walking for exercise.

## 2017-01-28 NOTE — Progress Notes (Signed)
[place]  [phone] [phone]  ID: [name] OB: Feb 05, 1971  MR#: [id number]  OEU#:[id number]  Patient Care Team: Patient, No Pcp Per as PCP - General (General Practice)  CHIEF COMPLAINT: Iron deficiency anemia, B-12 deficiency.  INTERVAL HISTORY: Patient returns to clinic today for repeat laboratory work and further evaluation. She currently feels well and is asymptomatic. She does not complain of weakness or fatigue today. She has no neurologic complaints. She does not complain of any further heavy menses. She denies any recent fevers or illnesses. She has a good appetite and denies weight loss. She has no chest pain or shortness of breath. She denies any nausea, vomiting, constipation, or diarrhea. She has no melena or hematochezia. She has no urinary complaints. Patient offers no specific complaints today.  REVIEW OF SYSTEMS:   Review of Systems  Constitutional: Negative for fever, malaise/fatigue and weight loss.  Respiratory: Negative.  Negative for cough and shortness of breath.   Cardiovascular: Negative.  Negative for chest pain and leg swelling.  Gastrointestinal: Negative.  Negative for abdominal pain, blood in stool and melena.  Genitourinary: Negative.  Negative for hematuria.  Musculoskeletal: Negative.   Skin: Negative.  Negative for rash.  Neurological: Negative for weakness.  Psychiatric/Behavioral: Negative.  The patient is not nervous/anxious.     As per HPI. Otherwise, a complete review of systems is negative.  PAST MEDICAL HISTORY: Past Medical History:  Diagnosis Date  . Anemia   . Blood transfusion without reported diagnosis   . Fibroid uterus 2014  . History of blood transfusion 2014, 2015   for symptomatic anemia    PAST SURGICAL HISTORY: Past Surgical History:  Procedure Laterality Date  . CHOLECYSTECTOMY      FAMILY HISTORY: Family History  Problem Relation Age of Onset  . Hyperthyroidism  Mother   . Hyperthyroidism Maternal Aunt   . Diabetes Maternal Grandmother     ADVANCED DIRECTIVES (Y/N):  N  HEALTH MAINTENANCE: Social History  Substance Use Topics  . Smoking status: Never Smoker  . Smokeless tobacco: Never Used  . Alcohol use No     Colonoscopy:  PAP:  Bone density:  Lipid panel:  Allergies  Allergen Reactions  . Meloxicam Itching  . Tramadol Itching and Swelling    Current Outpatient Prescriptions  Medication Sig Dispense Refill  . docusate sodium (COLACE) 100 MG capsule Take 1 capsule (100 mg total) by mouth daily as needed for mild constipation. 30 capsule 3  . Ferrous Sulfate (SLOW FE) 142 (45 Fe) MG TBCR Take 1 tablet by mouth 3 (three) times daily. 90 tablet 6  . norethindrone (MICRONOR,CAMILA,ERRIN) 0.35 MG tablet Take 1 tablet (0.35 mg total) by mouth daily. 1 Package 11  . ondansetron (ZOFRAN ODT) 4 MG disintegrating tablet Allow 1-2 tablets to dissolve in your mouth every 8 hours as needed for nausea/vomiting 30 tablet 0  . phentermine (ADIPEX-P) 37.5 MG tablet Take 1 tablet (37.5 mg total) by mouth daily before breakfast. 30 tablet 2  . Vitamin D, Ergocalciferol, (DRISDOL) 50000 units CAPS capsule Take 1 capsule (50,000 Units total) by mouth every 7 (seven) days. (Patient not taking: Reported on 01/29/2017) 7 capsule 2   No current facility-administered medications for this visit.     OBJECTIVE: Vitals:   01/29/17 1430  BP: 125/71  Pulse: 79  Resp: 20  Temp: 97.9 F (36.6 C)     Body mass index is 39.38 kg/m.    ECOG FS:0 - Asymptomatic  General:  Well-developed, well-nourished, no acute distress. Eyes: Pink conjunctiva, anicteric sclera. Lungs: Clear to auscultation bilaterally. Heart: Regular rate and rhythm. No rubs, murmurs, or gallops. Abdomen: Soft, nontender, nondistended. No organomegaly noted, normoactive bowel sounds. Musculoskeletal: No edema, cyanosis, or clubbing. Neuro: Alert, answering all questions appropriately.  Cranial nerves grossly intact. Skin: No rashes or petechiae noted. Psych: Normal affect.  LAB RESULTS:  Lab Results  Component Value Date   NA 138 10/16/2016   K 3.4 (L) 10/16/2016   CL 108 10/16/2016   CO2 24 10/16/2016   GLUCOSE 120 (H) 10/16/2016   BUN 11 10/16/2016   CREATININE 0.63 10/16/2016   CALCIUM 8.6 (L) 10/16/2016   PROT 7.3 10/16/2016   ALBUMIN 3.8 10/16/2016   AST 17 10/16/2016   ALT 24 10/16/2016   ALKPHOS 51 10/16/2016   BILITOT 0.3 10/16/2016   GFRNONAA >60 10/16/2016   GFRAA >60 10/16/2016    Lab Results  Component Value Date   WBC 3.6 01/29/2017   NEUTROABS 1.5 01/29/2017   HGB 12.2 01/29/2017   HCT 36.0 01/29/2017   MCV 82.1 01/29/2017   PLT 291 01/29/2017   Lab Results  Component Value Date   IRON 70 01/29/2017   TIBC 324 01/29/2017   IRONPCTSAT 22 01/29/2017   Lab Results  Component Value Date   FERRITIN 40 01/29/2017     STUDIES: No results found.  ASSESSMENT: Iron deficiency anemia, B-12 deficiency.  PLAN:    1. Iron deficiency anemia: Patient's hemoglobin and iron stores Continue to be within normal limits. Other than a decreased B-12 level, the remainder of her laboratory work was either negative or within normal limits. Patient does not require additional IV Feraheme today. She last received IV Feraheme on September 27, 2016. Return to clinic in 4 months with repeat laboratory work and further evaluation. 2. B-12 deficiency: Patient last received B-12 in March 2018, she will receive an additional 1000 g IM today.  3. Heavy menses: Improved. Continue treatment per OB/GYN.  Patient expressed understanding and was in agreement with this plan. She also understands that She can call clinic at any time with any questions, concerns, or complaints.    Lloyd Huger, MD   02/03/2017 7:53 PM

## 2017-01-29 ENCOUNTER — Inpatient Hospital Stay: Payer: Commercial Managed Care - HMO | Attending: Oncology

## 2017-01-29 ENCOUNTER — Inpatient Hospital Stay (HOSPITAL_BASED_OUTPATIENT_CLINIC_OR_DEPARTMENT_OTHER): Payer: Commercial Managed Care - HMO | Admitting: Oncology

## 2017-01-29 ENCOUNTER — Inpatient Hospital Stay: Payer: Commercial Managed Care - HMO

## 2017-01-29 VITALS — BP 125/71 | HR 79 | Temp 97.9°F | Resp 20 | Wt 222.3 lb

## 2017-01-29 VITALS — BP 115/76 | HR 74 | Temp 97.4°F | Resp 20

## 2017-01-29 DIAGNOSIS — E538 Deficiency of other specified B group vitamins: Secondary | ICD-10-CM

## 2017-01-29 DIAGNOSIS — Z79899 Other long term (current) drug therapy: Secondary | ICD-10-CM | POA: Insufficient documentation

## 2017-01-29 DIAGNOSIS — N920 Excessive and frequent menstruation with regular cycle: Secondary | ICD-10-CM | POA: Insufficient documentation

## 2017-01-29 DIAGNOSIS — D500 Iron deficiency anemia secondary to blood loss (chronic): Secondary | ICD-10-CM

## 2017-01-29 DIAGNOSIS — D509 Iron deficiency anemia, unspecified: Secondary | ICD-10-CM | POA: Insufficient documentation

## 2017-01-29 LAB — CBC WITH DIFFERENTIAL/PLATELET
Basophils Absolute: 0 10*3/uL (ref 0–0.1)
Basophils Relative: 1 %
EOS ABS: 0 10*3/uL (ref 0–0.7)
EOS PCT: 0 %
HCT: 36 % (ref 35.0–47.0)
Hemoglobin: 12.2 g/dL (ref 12.0–16.0)
LYMPHS ABS: 1.7 10*3/uL (ref 1.0–3.6)
LYMPHS PCT: 47 %
MCH: 27.8 pg (ref 26.0–34.0)
MCHC: 33.8 g/dL (ref 32.0–36.0)
MCV: 82.1 fL (ref 80.0–100.0)
MONO ABS: 0.3 10*3/uL (ref 0.2–0.9)
Monocytes Relative: 9 %
Neutro Abs: 1.5 10*3/uL (ref 1.4–6.5)
Neutrophils Relative %: 43 %
PLATELETS: 291 10*3/uL (ref 150–440)
RBC: 4.38 MIL/uL (ref 3.80–5.20)
RDW: 17.1 % — AB (ref 11.5–14.5)
WBC: 3.6 10*3/uL (ref 3.6–11.0)

## 2017-01-29 LAB — FERRITIN: FERRITIN: 40 ng/mL (ref 11–307)

## 2017-01-29 LAB — IRON AND TIBC
Iron: 70 ug/dL (ref 28–170)
SATURATION RATIOS: 22 % (ref 10.4–31.8)
TIBC: 324 ug/dL (ref 250–450)
UIBC: 254 ug/dL

## 2017-01-29 MED ORDER — CYANOCOBALAMIN 1000 MCG/ML IJ SOLN
1000.0000 ug | Freq: Once | INTRAMUSCULAR | Status: AC
  Administered 2017-01-29: 1000 ug via INTRAMUSCULAR
  Filled 2017-01-29: qty 1

## 2017-01-29 NOTE — Progress Notes (Signed)
Patient denies any concerns today.  

## 2017-01-30 LAB — VITAMIN B12: Vitamin B-12: 156 pg/mL — ABNORMAL LOW (ref 180–914)

## 2017-02-15 ENCOUNTER — Ambulatory Visit (INDEPENDENT_AMBULATORY_CARE_PROVIDER_SITE_OTHER): Payer: 59 | Admitting: Obstetrics and Gynecology

## 2017-02-15 ENCOUNTER — Encounter: Payer: Self-pay | Admitting: Obstetrics and Gynecology

## 2017-02-15 VITALS — BP 130/75 | HR 65 | Ht 63.0 in | Wt 222.9 lb

## 2017-02-15 DIAGNOSIS — E559 Vitamin D deficiency, unspecified: Secondary | ICD-10-CM | POA: Diagnosis not present

## 2017-02-15 DIAGNOSIS — E538 Deficiency of other specified B group vitamins: Secondary | ICD-10-CM

## 2017-02-15 MED ORDER — CYANOCOBALAMIN 1000 MCG/ML IJ SOLN: 1000.0000 ug | INTRAMUSCULAR | 1 refills | Status: DC

## 2017-02-15 MED ORDER — PHENTERMINE HCL 37.5 MG PO TABS: 37.5000 mg | ORAL_TABLET | Freq: Every day | ORAL | 2 refills | Status: DC

## 2017-02-15 MED ORDER — VITAMIN D (ERGOCALCIFEROL) 1.25 MG (50000 UNIT) PO CAPS: 50000.0000 [IU] | ORAL_CAPSULE | ORAL | 2 refills | Status: DC

## 2017-02-15 NOTE — Progress Notes (Signed)
SUBJECTIVE:  46 y.o. here for follow-up weight loss visit, previously seen 4 weeks ago. Denies any concerns and feels like medication has worked well in the past and has been out of meds for a month. Desires restart. Also needs vi D and B12 refilled. Needs to get weight below 200 for hysterectomy per Dr Marcelline Mates.  States she bleeds a moderate amount monthly for 5-7 dayson camila pills. Has lost 60#s to date.  OBJECTIVE:  BP 130/75   Pulse 65   Ht 5\' 3"  (1.6 m)   Wt 222 lb 14.4 oz (101.1 kg)   LMP 02/13/2017   BMI 39.48 kg/m   Body mass index is 39.48 kg/m. Patient appears well.  ASSESSMENT:  Obesity Vit D deficiency B12 deficeincy anemia Fibroid uterus  PLAN:  To continue with current medications. B12 1087mcg/ml injection given after labs obtained. meds refilled. Will wait to have follow up with Dr Marcelline Mates when weight is below 200. RTC in 4 weeks as planned   Melody [place], CNM

## 2017-02-16 LAB — B12 AND FOLATE PANEL
FOLATE: 2.6 ng/mL — AB (ref >3.0)
Vitamin B-12: 339 pg/mL (ref 232–1245)

## 2017-02-16 LAB — VITAMIN D 25 HYDROXY (VIT D DEFICIENCY, FRACTURES): VIT D 25 HYDROXY: 15.9 ng/mL — AB (ref 30.0–100.0)

## 2017-02-19 ENCOUNTER — Other Ambulatory Visit: Payer: Self-pay | Admitting: Obstetrics and Gynecology

## 2017-02-19 DIAGNOSIS — E559 Vitamin D deficiency, unspecified: Secondary | ICD-10-CM | POA: Insufficient documentation

## 2017-02-19 MED ORDER — VITAMIN D (ERGOCALCIFEROL) 1.25 MG (50000 UNIT) PO CAPS: 50000.0000 [IU] | ORAL_CAPSULE | ORAL | 3 refills | Status: DC

## 2017-03-14 ENCOUNTER — Ambulatory Visit (INDEPENDENT_AMBULATORY_CARE_PROVIDER_SITE_OTHER): Payer: 59 | Admitting: Obstetrics and Gynecology

## 2017-03-14 ENCOUNTER — Encounter: Payer: Self-pay | Admitting: Obstetrics and Gynecology

## 2017-03-14 VITALS — BP 124/75 | HR 71 | Wt 225.4 lb

## 2017-03-14 DIAGNOSIS — E663 Overweight: Secondary | ICD-10-CM

## 2017-03-14 MED ORDER — CYANOCOBALAMIN 1000 MCG/ML IJ SOLN
1000.0000 ug | Freq: Once | INTRAMUSCULAR | Status: AC
  Administered 2017-03-14: 1000 ug via INTRAMUSCULAR

## 2017-03-14 NOTE — Progress Notes (Signed)
Pt is here for wt, bp check,b-12 inj, states she hasnt been doing as well as she could, goes to work @ 4:00am and gets off @ 1:30 pm, trying to figure out when to take medication  03/14/17 wt- 225.4lb 02/15/17 wt- 222lb

## 2017-03-18 ENCOUNTER — Emergency Department
Admission: EM | Admit: 2017-03-18 | Discharge: 2017-03-18 | Disposition: A | Discharge status: Home / Self Care | Payer: 59 | Attending: Emergency Medicine | Admitting: Emergency Medicine

## 2017-03-18 ENCOUNTER — Encounter: Payer: Self-pay | Admitting: *Deleted

## 2017-03-18 DIAGNOSIS — L03115 Cellulitis of right lower limb: Secondary | ICD-10-CM | POA: Insufficient documentation

## 2017-03-18 DIAGNOSIS — L84 Corns and callosities: Secondary | ICD-10-CM

## 2017-03-18 DIAGNOSIS — Z79899 Other long term (current) drug therapy: Secondary | ICD-10-CM | POA: Diagnosis not present

## 2017-03-18 DIAGNOSIS — M722 Plantar fascial fibromatosis: Secondary | ICD-10-CM | POA: Insufficient documentation

## 2017-03-18 DIAGNOSIS — M79671 Pain in right foot: Secondary | ICD-10-CM | POA: Diagnosis present

## 2017-03-18 MED ORDER — CEPHALEXIN 500 MG PO CAPS: 500.0000 mg | ORAL_CAPSULE | Freq: Three times a day (TID) | ORAL | 0 refills | Status: DC

## 2017-03-18 NOTE — Discharge Instructions (Signed)
You are being treated for an infected callous of the right foot. Take the antibiotic as directed. Continue to use epsom salt soaks. Wear the post-op shoe at work this week. Follow-up with Dr. Elvina Marquez for continued symptoms.

## 2017-03-18 NOTE — ED Notes (Signed)
Pt states x 2 weeks pain to R foot. States she has noticed an area she described as bump to outside of R foot. States pain became unbearable today.

## 2017-03-18 NOTE — ED Provider Notes (Signed)
[facility] Emergency Department Provider Note ____________________________________________  Time seen: 1554  I have reviewed the triage vital signs and the nursing notes.  HISTORY  Chief Complaint  Foot Pain  HPI Sharon Marquez is a 46 y.o. female visits to the ED for evaluation of pain to the plantar aspect of the right foot for the last several weeks. Patient denies any injury, accident, or, to the foot. She also denies any history of hypertension or diabetes. She describes an area that is painful to touch to the plantar surface of her right pinky toe. Her job requires her to wear steel toed boots that they supplied. She denies any ill fitting or worn out shoes. She sometimes uses a second shoe insert for comfort. She denies any fevers, chills, or sweats.  Past Medical History:  Diagnosis Date  . Anemia   . Blood transfusion without reported diagnosis   . Fibroid uterus 2014  . History of blood transfusion 2014, 2015   for symptomatic anemia    Patient Active Problem List   Diagnosis Date Noted  . Vitamin D deficiency 02/19/2017  . B12 deficiency 10/22/2016  . Morbid obesity ([place]) 08/05/2016  . Abnormal uterine bleeding 08/05/2016  . History of blood transfusion 08/02/2016  . Iron deficiency anemia 07/11/2016  . Fibroid uterus 08/27/2012    Past Surgical History:  Procedure Laterality Date  . CHOLECYSTECTOMY      Prior to Admission medications   Medication Sig Start Date End Date Taking? Authorizing Provider  cephALEXin (KEFLEX) 500 MG capsule Take 1 capsule (500 mg total) by mouth 3 (three) times daily. 03/18/17   Khadeejah Castner, Dannielle Karvonen, PA-C  cyanocobalamin (,VITAMIN B-12,) 1000 MCG/ML injection Inject 1 mL (1,000 mcg total) into the muscle every 30 (thirty) days. 02/15/17   Shambley, Melody N, CNM  docusate sodium (COLACE) 100 MG capsule Take 1 capsule (100 mg total) by mouth daily as needed for mild constipation. 07/12/16   Rubie Maid, MD   Ferrous Sulfate (SLOW FE) 142 (45 Fe) MG TBCR Take 1 tablet by mouth 3 (three) times daily. 07/12/16   Rubie Maid, MD  norethindrone (MICRONOR,CAMILA,ERRIN) 0.35 MG tablet Take 1 tablet (0.35 mg total) by mouth daily. 10/03/16   Rubie Maid, MD  ondansetron (ZOFRAN ODT) 4 MG disintegrating tablet Allow 1-2 tablets to dissolve in your mouth every 8 hours as needed for nausea/vomiting Patient not taking: Reported on 02/15/2017 10/16/16   Hinda Kehr, MD  phentermine (ADIPEX-P) 37.5 MG tablet Take 1 tablet (37.5 mg total) by mouth daily before breakfast. 02/15/17   Shambley, Melody N, CNM  Vitamin D, Ergocalciferol, (DRISDOL) 50000 units CAPS capsule Take 1 capsule (50,000 Units total) by mouth 2 (two) times a week. 02/21/17   Shambley, Melody N, CNM   Allergies Meloxicam and Tramadol  Family History  Problem Relation Age of Onset  . Hyperthyroidism Mother   . Hyperthyroidism Maternal Aunt   . Diabetes Maternal Grandmother     Social History Social History  Substance Use Topics  . Smoking status: Never Smoker  . Smokeless tobacco: Never Used  . Alcohol use No    Review of Systems  Constitutional: Negative for fever. Musculoskeletal: Negative for back pain. Right foot pain as above. Skin: Negative for rash. Neurological: Negative for headaches, focal weakness or numbness. ____________________________________________  PHYSICAL EXAM:  VITAL SIGNS: ED Triage Vitals  Enc Vitals Group     BP 03/18/17 1614 (!) 140/91     Pulse Rate 03/18/17 1614 77  Resp 03/18/17 1614 16     Temp 03/18/17 1614 98.3 F (36.8 C)     Temp Source 03/18/17 1614 Oral     SpO2 03/18/17 1614 96 %     Weight 03/18/17 1612 222 lb (100.7 kg)     Height 03/18/17 1612 5\' 3"  (1.6 m)     Head Circumference --      Peak Flow --      Pain Score 03/18/17 1612 9     Pain Loc --      Pain Edu? --      Excl. in Needles? --     Constitutional: Alert and oriented. Well appearing and in no distress. Head:  Normocephalic and atraumatic. Cardiovascular: Normal rate, regular rhythm. Normal distal pulses. Musculoskeletal: Normal foot and ankle exam on the right. Patient is noted to have plantar surface area with some callus formation over the fifth MTP. There also appears to be underlying collection of serous fluid the plantar surface of the 5th MTP. Nontender with normal range of motion in all extremities.  Neurologic:  Normal gait without ataxia. No gross focal neurologic deficits are appreciated. Skin:  Skin is warm, dry and intact. No rash noted. ____________________________________________  PROCEDURES  Post-op shoe ____________________________________________  INITIAL IMPRESSION / ASSESSMENT AND PLAN / ED COURSE  Patient with what appears to be an early infected plantar callus of the right foot. She is placed on Keflex for management. She is also placed in a postop shoe for comfort. She will follow up with podiatry within the week for further evaluation and management. 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 Return precautions are reviewed. ____________________________________________  FINAL CLINICAL IMPRESSION(S) / ED DIAGNOSES  Final diagnoses:  Cellulitis of right lower extremity  Plantar callus      Carmie End, Dannielle Karvonen, PA-C 03/18/17 [place], [place], MD 03/18/17 2029

## 2017-03-18 NOTE — ED Triage Notes (Signed)
States she wears steel toe boots at work and has right foot pain for several weeks, ambulatory

## 2017-04-11 ENCOUNTER — Ambulatory Visit (INDEPENDENT_AMBULATORY_CARE_PROVIDER_SITE_OTHER): Payer: 59 | Admitting: Obstetrics and Gynecology

## 2017-04-11 ENCOUNTER — Encounter: Payer: Self-pay | Admitting: Obstetrics and Gynecology

## 2017-04-11 VITALS — BP 103/70 | HR 82 | Ht 63.0 in | Wt 224.0 lb

## 2017-04-11 DIAGNOSIS — E663 Overweight: Secondary | ICD-10-CM | POA: Diagnosis not present

## 2017-04-11 MED ORDER — CYANOCOBALAMIN 1000 MCG/ML IJ SOLN
1000.0000 ug | Freq: Once | INTRAMUSCULAR | Status: AC
  Administered 2017-04-11: 1000 ug via INTRAMUSCULAR

## 2017-04-11 NOTE — Progress Notes (Signed)
Pt is here for wt, bp check, b-12 inj, she is doing well, is working long hours and its hard to exercise, tries to walk at least 2 miles a day.  Pt is a little frustrated she didn't lose any weight this month, encouraged healthy eating and sometimes we lose inches still if no weight change  04/11/17 wt- 224lb 03/14/17 wt- 225lb 02/15/17 wt- 222lb

## 2017-05-07 ENCOUNTER — Ambulatory Visit (INDEPENDENT_AMBULATORY_CARE_PROVIDER_SITE_OTHER): Payer: 59 | Admitting: Obstetrics and Gynecology

## 2017-05-07 ENCOUNTER — Encounter: Payer: Self-pay | Admitting: Obstetrics and Gynecology

## 2017-05-07 VITALS — BP 122/73 | HR 77 | Wt 226.8 lb

## 2017-05-07 DIAGNOSIS — E663 Overweight: Secondary | ICD-10-CM | POA: Diagnosis not present

## 2017-05-07 MED ORDER — CYANOCOBALAMIN 1000 MCG/ML IJ SOLN
1000.0000 ug | Freq: Once | INTRAMUSCULAR | Status: AC
  Administered 2017-05-07: 1000 ug via INTRAMUSCULAR

## 2017-05-07 NOTE — Progress Notes (Signed)
Pt is here for wt, bp check, b-12 inj, she is very discouraged about not losing any weight, states she has been exercising and drinking lots of water.  She is going to make appt with Melody and discuss

## 2017-05-16 ENCOUNTER — Ambulatory Visit (INDEPENDENT_AMBULATORY_CARE_PROVIDER_SITE_OTHER): Payer: 59 | Admitting: Obstetrics and Gynecology

## 2017-05-16 ENCOUNTER — Telehealth: Payer: Self-pay | Admitting: Obstetrics and Gynecology

## 2017-05-16 ENCOUNTER — Encounter: Payer: Self-pay | Admitting: Obstetrics and Gynecology

## 2017-05-16 VITALS — BP 134/77 | HR 70 | Ht 63.0 in | Wt 230.4 lb

## 2017-05-16 DIAGNOSIS — B9789 Other viral agents as the cause of diseases classified elsewhere: Secondary | ICD-10-CM | POA: Diagnosis not present

## 2017-05-16 DIAGNOSIS — E559 Vitamin D deficiency, unspecified: Secondary | ICD-10-CM

## 2017-05-16 DIAGNOSIS — J329 Chronic sinusitis, unspecified: Secondary | ICD-10-CM

## 2017-05-16 DIAGNOSIS — E538 Deficiency of other specified B group vitamins: Secondary | ICD-10-CM

## 2017-05-16 DIAGNOSIS — Z79899 Other long term (current) drug therapy: Secondary | ICD-10-CM | POA: Diagnosis not present

## 2017-05-16 MED ORDER — PHENDIMETRAZINE TARTRATE ER 105 MG PO CP24: 1.0000 | ORAL_CAPSULE | Freq: Every day | ORAL | 2 refills | Status: DC

## 2017-05-16 MED ORDER — MOMETASONE FUROATE 50 MCG/ACT NA SUSP: 2.0000 | Freq: Every day | NASAL | 12 refills | Status: DC

## 2017-05-16 MED ORDER — LORATADINE-PSEUDOEPHEDRINE ER 10-240 MG PO TB24: 1.0000 | ORAL_TABLET | Freq: Every day | ORAL | 1 refills | Status: DC

## 2017-05-16 MED ORDER — CYANOCOBALAMIN 1000 MCG/ML IJ SOLN: 1000.0000 ug | INTRAMUSCULAR | 1 refills | Status: DC

## 2017-05-16 NOTE — Progress Notes (Signed)
```  Subjective:     Patient ID: Sharon Marquez, female   DOB: June 15, 1971, 46 y.o.   MRN: [id number]  HPI Here to discuss weight loss medications, as it is not working this round, and she has actually gained weight. States she is still exercising regularly and watching portions.  Also desires labs drawn to recheck vitamin deficiencies and anemia as she is feeling fatigued again. Reports onset of sore throat and sinus pressure 5 days ago, with low grade temperature and running nose. No fever now but still having a lot of sinus pressure.  Review of Systems Negative except stated above in HPI    Objective:   Physical Exam A&Ox4 Well groomed female Blood pressure 134/77, pulse 70, height 5\' 3"  (1.6 m), weight 230 lb 6.4 oz (104.5 kg), last menstrual period 04/10/2017. Body mass index is 40.81 kg/m.  ;ungs clear bilaterally Ears clear bilaterally, sinus passages red and boggy with clear phlegm noted, throat slightly red.    Assessment:     Obesity medication management Vit D & B12 deficeincy H/O anemia    Plan:     Will try Bontril 105mg  XL and continue with B12. Labs drawn, will follow up accordingly claritin-D and Nasonex prescription sent in and instructed on use, along with hydration, tylenol and rest.   RTC 1 month for wt/BP/B12  Lorelle Gibbs, CNM

## 2017-05-16 NOTE — Telephone Encounter (Signed)
Patient called and stated that the medication that was prescribed is not covered by insurance. The patient also stated that the medication is $34.00 and she is not able to afford it. The patient would like another alternative and a call back, Please advise.

## 2017-05-17 LAB — CBC
HEMOGLOBIN: 13.7 g/dL (ref 11.1–15.9)
Hematocrit: 41 % (ref 34.0–46.6)
MCH: 27.3 pg (ref 26.6–33.0)
MCHC: 33.4 g/dL (ref 31.5–35.7)
MCV: 82 fL (ref 79–97)
Platelets: 306 10*3/uL (ref 150–379)
RBC: 5.01 x10E6/uL (ref 3.77–5.28)
RDW: 13.2 % (ref 12.3–15.4)
WBC: 3.8 10*3/uL (ref 3.4–10.8)

## 2017-05-17 LAB — B12 AND FOLATE PANEL
Folate: 4 ng/mL (ref >3.0)
VITAMIN B 12: 542 pg/mL (ref 232–1245)

## 2017-05-17 LAB — FERRITIN: FERRITIN: 19 ng/mL (ref 15–150)

## 2017-05-17 LAB — VITAMIN D 25 HYDROXY (VIT D DEFICIENCY, FRACTURES): Vit D, 25-Hydroxy: 13.3 ng/mL — ABNORMAL LOW (ref 30.0–100.0)

## 2017-05-17 NOTE — Telephone Encounter (Signed)
pls advise

## 2017-05-20 ENCOUNTER — Other Ambulatory Visit: Payer: Self-pay | Admitting: Obstetrics and Gynecology

## 2017-05-20 DIAGNOSIS — E559 Vitamin D deficiency, unspecified: Secondary | ICD-10-CM

## 2017-05-20 MED ORDER — VITAMIN D (ERGOCALCIFEROL) 1.25 MG (50000 UNIT) PO CAPS: 50000.0000 [IU] | ORAL_CAPSULE | ORAL | 3 refills | Status: DC

## 2017-05-20 NOTE — Telephone Encounter (Signed)
Unfortunately there is not an alternative for the extended release tablet.

## 2017-06-06 ENCOUNTER — Encounter: Payer: 59 | Admitting: Obstetrics and Gynecology

## 2017-06-10 ENCOUNTER — Ambulatory Visit (INDEPENDENT_AMBULATORY_CARE_PROVIDER_SITE_OTHER): Payer: 59 | Admitting: Obstetrics and Gynecology

## 2017-06-10 MED ORDER — CYANOCOBALAMIN 1000 MCG/ML IJ SOLN
1000.0000 ug | Freq: Once | INTRAMUSCULAR | Status: AC
  Administered 2017-06-10: 1000 ug via INTRAMUSCULAR

## 2017-06-10 NOTE — Progress Notes (Signed)
Pt presents for  Weight,B/P, B-12 injection. No side effects of medications-Phentermine or B-12. Weight gain __1.8__ lbs. Encourage eating healthy and exercise.

## 2017-06-12 ENCOUNTER — Encounter: Payer: Self-pay | Admitting: Obstetrics and Gynecology

## 2017-06-12 ENCOUNTER — Ambulatory Visit (INDEPENDENT_AMBULATORY_CARE_PROVIDER_SITE_OTHER): Payer: 59 | Admitting: Obstetrics and Gynecology

## 2017-06-12 ENCOUNTER — Other Ambulatory Visit: Payer: Self-pay | Admitting: *Deleted

## 2017-06-12 VITALS — BP 114/72 | HR 75 | Ht 63.0 in | Wt 230.2 lb

## 2017-06-12 DIAGNOSIS — D259 Leiomyoma of uterus, unspecified: Secondary | ICD-10-CM | POA: Diagnosis not present

## 2017-06-12 DIAGNOSIS — Z01818 Encounter for other preprocedural examination: Secondary | ICD-10-CM

## 2017-06-12 DIAGNOSIS — N939 Abnormal uterine and vaginal bleeding, unspecified: Secondary | ICD-10-CM | POA: Diagnosis not present

## 2017-06-12 DIAGNOSIS — D500 Iron deficiency anemia secondary to blood loss (chronic): Secondary | ICD-10-CM | POA: Diagnosis not present

## 2017-06-12 DIAGNOSIS — Z9289 Personal history of other medical treatment: Secondary | ICD-10-CM

## 2017-06-12 MED ORDER — PHENDIMETRAZINE TARTRATE ER 105 MG PO CP24: 1.0000 | ORAL_CAPSULE | Freq: Every day | ORAL | 2 refills | Status: DC

## 2017-06-12 NOTE — Progress Notes (Signed)
GYNECOLOGY PROGRESS NOTE  Subjective:    Patient ID: Sharon Marquez, female    DOB: September 29, 1970, 46 y.o.   MRN: [id number]  HPI  Patient is a 46 y.o. G50P0101 female who presents for discussion of surgical management for fibroid uterus, abnormal uterine bleeding, and iron deficiency anemia (requiring blood transfusion in the past, as well as iron transfusions).  Patient is currently on Micronor to manage her bleeding as she had started a new job when her symptoms were at their worst, and did not have the time to take off for the recovery period. Currently desires definitive surgery with hysterectomy.  The following portions of the patient's history were reviewed and updated as appropriate: allergies, current medications, past family history, past medical history, past social history, past surgical history and problem list.  Review of Systems Pertinent items noted in HPI and remainder of comprehensive ROS otherwise negative.   Objective:   Blood pressure 114/72, pulse 75, height 5\' 3"  (1.6 m), weight 230 lb 3.2 oz (104.4 kg), last menstrual period 06/03/2017.  Body mass index is 40.78 kg/m.  General appearance: alert and no distress Abdomen: soft, non-tender; bowel sounds normal; no masses,  no organomegaly Pelvic: external genitalia normal, rectovaginal septum normal.  Vagina without discharge.  Cervix normal appearing, no lesions and no motion tenderness.  Uterus mobile, nontender, normal shape and size. Good descensus.  Adnexae non-palpable, nontender bilaterally.  Extremities: extremities normal, atraumatic, no cyanosis or edema Neurologic: Grossly normal   Labs: Lab Results  Component Value Date   WBC 3.8 05/16/2017   HGB 13.7 05/16/2017   HCT 41.0 05/16/2017   MCV 82 05/16/2017   PLT 306 05/16/2017    Assessment:   Fibroid uterus Abnormal uterine bleeding Iron deficiency anemia History of blood transfusion Morbid obesity  Plan:   Patient desires surgical management  with hysterectomy.  The risks of surgery were discussed in detail with the patient including but not limited to: bleeding which may require transfusion or reoperation; infection which may require prolonged hospitalization or re-hospitalization and antibiotic therapy; injury to bowel, bladder, ureters and major vessels or other surrounding organs; need for additional procedures including laparotomy; thromboembolic phenomenon, incisional problems and other postoperative or anesthesia complications.  Patient was told that the likelihood that her condition and symptoms will be treated effectively with this surgical management was very high; the postoperative expectations were also discussed in detail. The patient also understands the alternative treatment options which were discussed in full. All questions were answered.  She was told that she will be contacted by our surgical scheduler regarding the time and date of her surgery; routine preoperative instructions of having nothing to eat or drink after midnight on the day prior to surgery and also coming to the hospital 1.5 hours prior to her time of surgery were also emphasized.  She was told she may be called for a preoperative appointment about a week prior to surgery and will be given further preoperative instructions at that visit. Printed patient education handouts about the procedure were given to the patient to review at home.  Based on patient's exam, she is a candidate for LAVH with bilateral salpingectomy (patient desires to preserve ovarian function). Also counseled on possible TAH.  Tentatively scheduled for 07/15/2017.   - Currently hemodynamically stable with a Hgb of ~13.   A total of 20 minutes were spent face-to-face with the patient during this encounter and over half of that time involved counseling and coordination of care.  Rubie Maid, MD [facility]

## 2017-06-12 NOTE — Patient Instructions (Addendum)
You are scheduled for surgery on 03/28/2015.  Nothing to eat after midnight on day prior to surgery.  Do not take any medications unless recommended by your provider on day prior to surgery.  Do not take NSAIDs (Motrin, Aleve) or aspirin 7 days prior to surgery.  You may take Tylenol products for minor aches and pains.  You will receive a prescription for pain medications post-operatively.  You will be contacted by phone approximately 1 week prior to surgery to schedule pre-operative appointment.  Please call the office if you have any questions regarding your upcoming surgery.    Hysterectomy Information A hysterectomy is a surgery in which your uterus is removed. This surgery may be done to treat various medical problems. After the surgery, you will no longer have menstrual periods. The surgery will also make you unable to become pregnant (sterile). The fallopian tubes and ovaries can be removed (bilateral salpingo-oophorectomy) during this surgery as well. Reasons for a hysterectomy  Persistent, abnormal bleeding.  Lasting (chronic) pelvic pain or infection.  The lining of the uterus (endometrium) starts growing outside the uterus (endometriosis).  The endometrium starts growing in the muscle of the uterus (adenomyosis).  The uterus falls down into the vagina (pelvic organ prolapse).  Noncancerous growths in the uterus (uterine fibroids) that cause symptoms.  Precancerous cells.  Cervical cancer or uterine cancer. Types of hysterectomies  Supracervical hysterectomy-In this type, the top part of the uterus is removed, but not the cervix.  Total hysterectomy-The uterus and cervix are removed.  Radical hysterectomy-The uterus, the cervix, and the fibrous tissue that holds the uterus in place in the pelvis (parametrium) are removed. Ways a hysterectomy can be performed  Abdominal hysterectomy-A large surgical cut (incision) is made in the abdomen. The uterus is removed through this  incision.  Vaginal hysterectomy-An incision is made in the vagina. The uterus is removed through this incision. There are no abdominal incisions.  Conventional laparoscopic hysterectomy-Three or four small incisions are made in the abdomen. A thin, lighted tube with a camera (laparoscope) is inserted into one of the incisions. Other tools are put through the other incisions. The uterus is cut into small pieces. The small pieces are removed through the incisions, or they are removed through the vagina.  Laparoscopically assisted vaginal hysterectomy (LAVH)-Three or four small incisions are made in the abdomen. Part of the surgery is performed laparoscopically and part vaginally. The uterus is removed through the vagina.  Robot-assisted laparoscopic hysterectomy-A laparoscope and other tools are inserted into 3 or 4 small incisions in the abdomen. A computer-controlled device is used to give the surgeon a 3D image and to help control the surgical instruments. This allows for more precise movements of surgical instruments. The uterus is cut into small pieces and removed through the incisions or removed through the vagina. What are the risks? Possible complications associated with this procedure include:  Bleeding and risk of blood transfusion. Tell your health care provider if you do not want to receive any blood products.  Blood clots in the legs or lung.  Infection.  Injury to surrounding organs.  Problems or side effects related to anesthesia.  Conversion to an abdominal hysterectomy from one of the other techniques.  What to expect after a hysterectomy  You will be given pain medicine.  You will need to have someone with you for the first 3-5 days after you go home.  You will need to follow up with your surgeon in 2-4 weeks after surgery to  evaluate your progress.  You may have early menopause symptoms such as hot flashes, night sweats, and insomnia.  If you had a hysterectomy for  a problem that was not cancer or not a condition that could lead to cancer, then you no longer need Pap tests. However, even if you no longer need a Pap test, a regular exam is a good idea to make sure no other problems are starting. This information is not intended to replace advice given to you by your health care provider. Make sure you discuss any questions you have with your health care provider. Document Released: 02/06/2001 Document Revised: 01/19/2016 Document Reviewed: 04/20/2013 Elsevier Interactive Patient Education  2017 [employer].

## 2017-06-16 NOTE — H&P (Signed)
GYNECOLOGY PREOPERATIVE HISTORY AND PHYSICAL   Subjective:  Sharon Marquez is a 46 y.o. G1P0101 here for surgical management of abnormal uterine bleeding, fibroid uterus, and h/o anemia.  No significant preoperative concerns.  Proposed surgery: LAVH with bilateral salpingectomy   Pertinent Gynecological History: Menses: flow was previously excessive, requiring changing of clothes due to accidents, and was lasting 12-14 days.  Currently is being managed on Micronor (due to previous job and insurance issues) but now desires surgical management with hysterectomy.  Bleeding: dysfunctional uterine bleeding Contraception: oral progesterone-only contraceptive Last mammogram: Patient has never had a mammogram.      Past Medical History:  Diagnosis Date  . Anemia   . Blood transfusion without reported diagnosis   . Fibroid uterus 2014  . History of blood transfusion 2014, 2015   for symptomatic anemia    Past Surgical History:  Procedure Laterality Date  . CHOLECYSTECTOMY     OB History  Gravida Para Term Preterm AB Living  1 1 0 1   1  SAB TAB Ectopic Multiple Live Births               # Outcome Date GA Lbr Len/2nd Weight Sex Delivery Anes PTL Lv  1 Preterm 05/11/89 [redacted]w[redacted]d  5 lb 2 oz (2.325 kg)  Vag-Spont         Family History  Problem Relation Age of Onset  . Hyperthyroidism Mother   . Hyperthyroidism Maternal Aunt   . Diabetes Maternal Grandmother     Social History   Social History  . Marital status: Single    Spouse name: N/A  . Number of children: N/A  . Years of education: N/A   Occupational History  . Not on file.   Social History Main Topics  . Smoking status: Never Smoker  . Smokeless tobacco: Never Used  . Alcohol use No  . Drug use: No  . Sexual activity: No   Other Topics Concern  . Not on file   Social History Narrative  . No narrative on file    Current Outpatient Prescriptions on File Prior to Visit  Medication Sig Dispense Refill    . cyanocobalamin (,VITAMIN B-12,) 1000 MCG/ML injection Inject 1 mL (1,000 mcg total) into the muscle every 30 (thirty) days. 10 mL 1  . norethindrone (MICRONOR,CAMILA,ERRIN) 0.35 MG tablet Take 1 tablet (0.35 mg total) by mouth daily. 1 Package 11  . Vitamin D, Ergocalciferol, (DRISDOL) 50000 units CAPS capsule Take 1 capsule (50,000 Units total) by mouth 2 (two) times a week. 24 capsule 3  . mometasone (NASONEX) 50 MCG/ACT nasal spray Place 2 sprays into the nose daily. (Patient not taking: Reported on 06/12/2017) 17 g 12   No current facility-administered medications on file prior to visit.     Allergies  Allergen Reactions  . Meloxicam Itching  . Tramadol Itching and Swelling     Review of Systems Constitutional: No recent fever/chills/sweats Respiratory: No recent cough/bronchitis Cardiovascular: No chest pain Gastrointestinal: No recent nausea/vomiting/diarrhea Genitourinary: No UTI symptoms Hematologic/lymphatic:No history of coagulopathy or recent blood thinner use    Objective:   Blood pressure 114/72, pulse 75, height 5\' 3"  (1.6 m), weight 230 lb 3.2 oz (104.4 kg), last menstrual period 06/03/2017. Body mass index is 40.78 kg/m.  CONSTITUTIONAL: Well-developed, well-nourished female in no acute distress. Obese. HENT:  Normocephalic, atraumatic, External right and left ear normal. Oropharynx is clear and moist EYES: Conjunctivae and EOM are normal. Pupils are equal, round, and reactive  to light. No scleral icterus.  NECK: Normal range of motion, supple, no masses SKIN: Skin is warm and dry. No rash noted. Not diaphoretic. No erythema. No pallor. NEUROLOGIC: Alert and oriented to person, place, and time. Normal reflexes, muscle tone coordination. No cranial nerve deficit noted. PSYCHIATRIC: Normal mood and affect. Normal behavior. Normal judgment and thought content. CARDIOVASCULAR: Normal heart rate noted, regular rhythm RESPIRATORY: Effort and breath sounds normal, no  problems with respiration noted ABDOMEN: Soft, nontender, nondistended. PELVIC: Deferred MUSCULOSKELETAL: Normal range of motion. No edema and no tenderness. 2+ distal pulses.    Labs: Lab Results  Component Value Date   WBC 3.8 05/16/2017   HGB 13.7 05/16/2017   HCT 41.0 05/16/2017   MCV 82 05/16/2017   PLT 306 05/16/2017    Lab Results  Component Value Date   TSH 4.726 (H) 07/11/2016   T3TOTAL 123 07/12/2016    Imaging Studies: CLINICAL DATA:  Patient with abnormal uterine bleeding.  EXAM (07/12/2016): TRANSABDOMINAL AND TRANSVAGINAL ULTRASOUND OF PELVIS  TECHNIQUE: Both transabdominal and transvaginal ultrasound examinations of the pelvis were performed. Transabdominal technique was performed for global imaging of the pelvis including uterus, ovaries, adnexal regions, and pelvic cul-de-sac. It was necessary to proceed with endovaginal exam following the transabdominal exam to visualize the endometrium.  COMPARISON:  Pelvic ultrasound 06/05/2013  FINDINGS: Uterus  Measurements: 6.9 x 3.8 x 5.1 cm. Retroverted. There is a 5.8 x 4.8 x 5.0 cm fibroid within the right aspect of the uterine body with a large submucosal component. Additionally there is a 1.1 x 0.8 x 1.2 cm intramural fibroid with the uterine fundus. There is an additional adjacent 0.6 x 0.7 x 0.8 cm intramural fibroid within the uterine fundus.  Endometrium  Thickness: 3 mm.  No focal abnormality visualized.  Right ovary  Not visualized.  Left ovary  Measurements: 3.1 x 2.1 x 1.9 cm. Normal appearance/no adnexal mass.  Other findings  No abnormal free fluid.  IMPRESSION: Large fibroid within the right aspect of the uterine body with a significant submucosal component which appears to exert mass effect and displace the endometrium. This may represent the causative etiology for uterine bleeding.  Endometrium measures 3 mm. If bleeding remains unresponsive to hormonal or  medical therapy, sonohysterogram should be considered for focal lesion work-up. (Ref: Radiological Reasoning: Algorithmic Workup of Abnormal Vaginal Bleeding with Endovaginal Sonography and Sonohysterography. AJR 2008; 335:K56-25)   Electronically Signed   By: Lovey Newcomer M.D.   On: 07/12/2016 15:19   Pathology (08/28/2016):  Diagnosis:  ENDOMETRIUM, BIOPSY:  SECRETORY ENDOMETRIUM,DAY 20-21, POST-OVULATORY DAY 6-7. NO  HYPERPLASIA OR CARCINOMA. CHRONIC ENDOMETRITIS.COMMENT:  IMMUNOSTAIN CD 138 DEMONSTRATE PLASMA CELLS, IT SUPPORT CHRONIC  ENDOMETRITIS.   Assessment:    Fibroid uterus Abnormal uterine bleeding History of anemia and blood transfusions Morbid obesity   Plan:    Counseling: Procedure, risks, reasons, benefits and complications (including injury to bowel, bladder, major blood vessel, ureter, bleeding, possibility of transfusion, infection, or fistula formation) reviewed in detail. Likelihood of success in alleviating the patient's condition was discussed. Routine postoperative instructions will be reviewed with the patient and her family in detail after surgery.  The patient concurred with the proposed plan, giving informed verbal consent for the surgery.   Preop testing ordered. Instructions reviewed, including NPO after midnight.       Rubie Maid, MD [facility]

## 2017-06-24 ENCOUNTER — Telehealth: Payer: Self-pay | Admitting: Obstetrics and Gynecology

## 2017-06-24 NOTE — Telephone Encounter (Signed)
Patient returned your call. Please call again.

## 2017-06-24 NOTE — Telephone Encounter (Signed)
Pt aware FMLA forms completed and faxed. Pt aware copy will be up front for p/u.

## 2017-06-24 NOTE — Telephone Encounter (Signed)
Pt is returning your call

## 2017-07-08 ENCOUNTER — Encounter: Payer: Self-pay | Admitting: Obstetrics and Gynecology

## 2017-07-08 ENCOUNTER — Ambulatory Visit (INDEPENDENT_AMBULATORY_CARE_PROVIDER_SITE_OTHER): Payer: 59 | Admitting: Obstetrics and Gynecology

## 2017-07-08 VITALS — BP 128/82 | HR 88 | Wt 233.0 lb

## 2017-07-08 DIAGNOSIS — E663 Overweight: Secondary | ICD-10-CM

## 2017-07-08 NOTE — Progress Notes (Signed)
Pt is here for wt, bp check, b-12 inj Denies any s/e , doesn't really like the bontril, prefers phentermine instead, pt is having a hysterectomy soon  07/08/17 wt- 233lb 05/16/17 wt- 230lb   No b-12 given today, pt forgot and didn't use in house

## 2017-07-10 ENCOUNTER — Encounter: Payer: Self-pay | Admitting: Obstetrics and Gynecology

## 2017-07-10 ENCOUNTER — Ambulatory Visit (INDEPENDENT_AMBULATORY_CARE_PROVIDER_SITE_OTHER): Payer: 59 | Admitting: Obstetrics and Gynecology

## 2017-07-10 ENCOUNTER — Encounter
Admission: RE | Admit: 2017-07-10 | Discharge: 2017-07-10 | Disposition: A | Discharge status: Home / Self Care | Payer: 59 | Source: Ambulatory Visit | Attending: Obstetrics and Gynecology | Admitting: Obstetrics and Gynecology

## 2017-07-10 ENCOUNTER — Other Ambulatory Visit: Payer: Self-pay

## 2017-07-10 VITALS — BP 125/72 | HR 78 | Ht 63.0 in | Wt 236.4 lb

## 2017-07-10 DIAGNOSIS — N939 Abnormal uterine and vaginal bleeding, unspecified: Secondary | ICD-10-CM | POA: Diagnosis not present

## 2017-07-10 DIAGNOSIS — D250 Submucous leiomyoma of uterus: Secondary | ICD-10-CM

## 2017-07-10 DIAGNOSIS — D259 Leiomyoma of uterus, unspecified: Secondary | ICD-10-CM | POA: Diagnosis not present

## 2017-07-10 DIAGNOSIS — Z01812 Encounter for preprocedural laboratory examination: Secondary | ICD-10-CM | POA: Insufficient documentation

## 2017-07-10 DIAGNOSIS — Z8249 Family history of ischemic heart disease and other diseases of the circulatory system: Secondary | ICD-10-CM | POA: Diagnosis not present

## 2017-07-10 DIAGNOSIS — E669 Obesity, unspecified: Secondary | ICD-10-CM | POA: Diagnosis not present

## 2017-07-10 DIAGNOSIS — Z833 Family history of diabetes mellitus: Secondary | ICD-10-CM | POA: Insufficient documentation

## 2017-07-10 DIAGNOSIS — Z9049 Acquired absence of other specified parts of digestive tract: Secondary | ICD-10-CM | POA: Insufficient documentation

## 2017-07-10 DIAGNOSIS — Z888 Allergy status to other drugs, medicaments and biological substances status: Secondary | ICD-10-CM | POA: Insufficient documentation

## 2017-07-10 DIAGNOSIS — Z862 Personal history of diseases of the blood and blood-forming organs and certain disorders involving the immune mechanism: Secondary | ICD-10-CM

## 2017-07-10 DIAGNOSIS — Z9289 Personal history of other medical treatment: Secondary | ICD-10-CM

## 2017-07-10 DIAGNOSIS — Z79899 Other long term (current) drug therapy: Secondary | ICD-10-CM | POA: Insufficient documentation

## 2017-07-10 DIAGNOSIS — D649 Anemia, unspecified: Secondary | ICD-10-CM | POA: Diagnosis not present

## 2017-07-10 HISTORY — DX: Vitamin B deficiency, unspecified: E53.9

## 2017-07-10 HISTORY — DX: Family history of other specified conditions: Z84.89

## 2017-07-10 LAB — BASIC METABOLIC PANEL
ANION GAP: 7 (ref 5–15)
BUN: 13 mg/dL (ref 6–20)
CALCIUM: 8.7 mg/dL — AB (ref 8.9–10.3)
CO2: 26 mmol/L (ref 22–32)
CREATININE: 0.56 mg/dL (ref 0.44–1.00)
Chloride: 105 mmol/L (ref 101–111)
GFR calc non Af Amer: >60 mL/min (ref >60)
Glucose, Bld: 98 mg/dL (ref 65–99)
Potassium: 3.4 mmol/L — ABNORMAL LOW (ref 3.5–5.1)
SODIUM: 138 mmol/L (ref 135–145)

## 2017-07-10 LAB — RAPID HIV SCREEN (HIV 1/2 AB+AG)
HIV 1/2 ANTIBODIES: NONREACTIVE
HIV-1 P24 ANTIGEN - HIV24: NONREACTIVE

## 2017-07-10 LAB — TYPE AND SCREEN
ABO/RH(D): O POS
ANTIBODY SCREEN: NEGATIVE

## 2017-07-10 LAB — CBC
HCT: 38.5 % (ref 35.0–47.0)
HEMOGLOBIN: 12.4 g/dL (ref 12.0–16.0)
MCH: 26.2 pg (ref 26.0–34.0)
MCHC: 32.3 g/dL (ref 32.0–36.0)
MCV: 81.3 fL (ref 80.0–100.0)
PLATELETS: 287 10*3/uL (ref 150–440)
RBC: 4.74 MIL/uL (ref 3.80–5.20)
RDW: 15.5 % — ABNORMAL HIGH (ref 11.5–14.5)
WBC: 3.4 10*3/uL — AB (ref 3.6–11.0)

## 2017-07-10 NOTE — H&P (Signed)
GYNECOLOGY PREOPERATIVE HISTORY AND PHYSICAL   Subjective:  Sharon Marquez is a 46 y.o. G1P0101 here for surgical management of abnormal uterine bleeding, fibroid uterus, and h/o anemia.  No significant preoperative concerns.  Proposed surgery: LAVH with bilateral salpingectomy   Pertinent Gynecological History: Menses: flow was previously excessive, requiring changing of clothes due to accidents, and was lasting 12-14 days.  Currently is being managed on Micronor (due to previous job and insurance issues) but now desires surgical management with hysterectomy.  Bleeding: dysfunctional uterine bleeding Contraception: oral progesterone-only contraceptive Last mammogram: Patient has never had a mammogram.      Past Medical History:  Diagnosis Date  . Anemia   . Blood transfusion without reported diagnosis   . Family history of adverse reaction to anesthesia    "mother has difficulty waking up after surgery ."  . Fibroid uterus 2014  . History of blood transfusion 2014, 2015   for symptomatic anemia  . Vitamin B deficiency     Past Surgical History:  Procedure Laterality Date  . CHOLECYSTECTOMY     OB History  Gravida Para Term Preterm AB Living  1 1 0 1   1  SAB TAB Ectopic Multiple Live Births               # Outcome Date GA Lbr Len/2nd Weight Sex Delivery Anes PTL Lv  1 Preterm 05/11/89 [redacted]w[redacted]d  5 lb 2 oz (2.325 kg)  Vag-Spont         Family History  Problem Relation Age of Onset  . Hyperthyroidism Mother   . Diabetes Mother   . Hypertension Mother   . Hyperthyroidism Maternal Aunt   . Diabetes Maternal Grandmother     Social History   Socioeconomic History  . Marital status: Single    Spouse name: Not on file  . Number of children: Not on file  . Years of education: Not on file  . Highest education level: Not on file  Social Needs  . Financial resource strain: Not on file  . Food insecurity - worry: Not on file  . Food insecurity - inability: Not on  file  . Transportation needs - medical: Not on file  . Transportation needs - non-medical: Not on file  Occupational History  . Not on file  Tobacco Use  . Smoking status: Never Smoker  . Smokeless tobacco: Never Used  Substance and Sexual Activity  . Alcohol use: No  . Drug use: No  . Sexual activity: No  Other Topics Concern  . Not on file  Social History Narrative  . Not on file    Current Outpatient Medications on File Prior to Visit  Medication Sig Dispense Refill  . cyanocobalamin (,VITAMIN B-12,) 1000 MCG/ML injection Inject 1 mL (1,000 mcg total) into the muscle every 30 (thirty) days. 10 mL 1  . Fe Cbn-Fe Gluc-FA-B12-C-DSS (FERRALET 90 PO) Take 1 tablet 2 (two) times daily by mouth.    . norethindrone (MICRONOR,CAMILA,ERRIN) 0.35 MG tablet Take 1 tablet (0.35 mg total) by mouth daily. 1 Package 11  . Phendimetrazine Tartrate 105 MG CP24 Take 1 capsule (105 mg total) by mouth daily. 30 each 2  . Vitamin D, Ergocalciferol, (DRISDOL) 50000 units CAPS capsule Take 1 capsule (50,000 Units total) by mouth 2 (two) times a week. 24 capsule 3  . mometasone (NASONEX) 50 MCG/ACT nasal spray Place 2 sprays into the nose daily. (Patient not taking: Reported on 06/12/2017) 17 g 12   No  current facility-administered medications on file prior to visit.     Allergies  Allergen Reactions  . Meloxicam Itching  . Tramadol Itching and Swelling     Review of Systems Constitutional: No recent fever/chills/sweats Respiratory: No recent cough/bronchitis Cardiovascular: No chest pain Gastrointestinal: No recent nausea/vomiting/diarrhea Genitourinary: No UTI symptoms Hematologic/lymphatic:No history of coagulopathy or recent blood thinner use    Objective:   Blood pressure 125/72, pulse 78, height 5\' 3"  (1.6 m), weight 236 lb 6.4 oz (107.2 kg), last menstrual period 07/07/2017. Body mass index is 41.88 kg/m.  CONSTITUTIONAL: Well-developed, well-nourished female in no acute distress.  Obese. HENT:  Normocephalic, atraumatic, External right and left ear normal. Oropharynx is clear and moist EYES: Conjunctivae and EOM are normal. Pupils are equal, round, and reactive to light. No scleral icterus.  NECK: Normal range of motion, supple, no masses SKIN: Skin is warm and dry. No rash noted. Not diaphoretic. No erythema. No pallor. NEUROLOGIC: Alert and oriented to person, place, and time. Normal reflexes, muscle tone coordination. No cranial nerve deficit noted. PSYCHIATRIC: Normal mood and affect. Normal behavior. Normal judgment and thought content. CARDIOVASCULAR: Normal heart rate noted, regular rhythm RESPIRATORY: Effort and breath sounds normal, no problems with respiration noted ABDOMEN: Soft, nontender, nondistended. PELVIC: Deferred MUSCULOSKELETAL: Normal range of motion. No edema and no tenderness. 2+ distal pulses.    Labs:  Lab Results  Component Value Date   WBC 3.8 05/16/2017   HGB 13.7 05/16/2017   HCT 41.0 05/16/2017   MCV 82 05/16/2017   PLT 306 05/16/2017       Lab Results  Component Value Date   TSH 4.726 (H) 07/11/2016   T3TOTAL 123 07/12/2016    Imaging Studies: CLINICAL DATA:  Patient with abnormal uterine bleeding.  EXAM (07/12/2016): TRANSABDOMINAL AND TRANSVAGINAL ULTRASOUND OF PELVIS  TECHNIQUE: Both transabdominal and transvaginal ultrasound examinations of the pelvis were performed. Transabdominal technique was performed for global imaging of the pelvis including uterus, ovaries, adnexal regions, and pelvic cul-de-sac. It was necessary to proceed with endovaginal exam following the transabdominal exam to visualize the endometrium.  COMPARISON:  Pelvic ultrasound 06/05/2013  FINDINGS: Uterus  Measurements: 6.9 x 3.8 x 5.1 cm. Retroverted. There is a 5.8 x 4.8 x 5.0 cm fibroid within the right aspect of the uterine body with a large submucosal component. Additionally there is a 1.1 x 0.8 x 1.2 cm intramural  fibroid with the uterine fundus. There is an additional adjacent 0.6 x 0.7 x 0.8 cm intramural fibroid within the uterine fundus.  Endometrium  Thickness: 3 mm.  No focal abnormality visualized.  Right ovary  Not visualized.  Left ovary  Measurements: 3.1 x 2.1 x 1.9 cm. Normal appearance/no adnexal mass.  Other findings  No abnormal free fluid.  IMPRESSION: Large fibroid within the right aspect of the uterine body with a significant submucosal component which appears to exert mass effect and displace the endometrium. This may represent the causative etiology for uterine bleeding.  Endometrium measures 3 mm. If bleeding remains unresponsive to hormonal or medical therapy, sonohysterogram should be considered for focal lesion work-up. (Ref: Radiological Reasoning: Algorithmic Workup of Abnormal Vaginal Bleeding with Endovaginal Sonography and Sonohysterography. AJR 2008; 235:T61-44)   Electronically Signed   By: Lovey Newcomer M.D.   On: 07/12/2016 15:19   Pathology (08/28/2016):  Diagnosis:  ENDOMETRIUM, BIOPSY:  SECRETORY ENDOMETRIUM,DAY 20-21, POST-OVULATORY DAY 6-7. NO  HYPERPLASIA OR CARCINOMA. CHRONIC ENDOMETRITIS.COMMENT:  IMMUNOSTAIN CD 138 DEMONSTRATE PLASMA CELLS, IT SUPPORT CHRONIC  ENDOMETRITIS.  Assessment:    Fibroid uterus Abnormal uterine bleeding History of anemia and blood transfusions Morbid obesity   Plan:    Counseling: Procedure, risks, reasons, benefits and complications (including injury to bowel, bladder, major blood vessel, ureter, bleeding, possibility of transfusion, infection, or fistula formation) reviewed in detail. Likelihood of success in alleviating the patient's condition was discussed. Routine postoperative instructions will be reviewed with the patient and her family in detail after surgery.  The patient concurred with the proposed plan, giving informed verbal consent for the surgery.  Scheduled for LAVH with  bilateral salpingectomy.  Preop testing ordered. Instructions reviewed, including NPO after midnight.       Rubie Maid, MD [facility]

## 2017-07-10 NOTE — Progress Notes (Signed)
    GYNECOLOGY PROGRESS NOTE  Subjective:    Patient ID: Sharon Marquez, female    DOB: Aug 27, 1971, 46 y.o.   MRN: [id number]  HPI  Patient is a 46 y.o. G30P0101 female who presents for pre-operative appointment.  She is desiring definitive surgery with hysterectomy. Indications for procedure are fibroid uterus, abnormal uterine bleeding, and h/o iron deficiency anemia (requiring blood transfusion in the past, as well as iron transfusions).  Patient is currently on Micronor to manage her bleeding.   The following portions of the patient's history were reviewed and updated as appropriate: allergies, current medications, past family history, past medical history, past social history, past surgical history and problem list.   Review of Systems Pertinent items noted in HPI and remainder of comprehensive ROS otherwise negative.   Objective:   Blood pressure 125/72, pulse 78, height 5\' 3"  (1.6 m), weight 236 lb 6.4 oz (107.2 kg), last menstrual period 07/07/2017.  Body mass index is 41.88 kg/m.  General appearance: alert and no distress Abdomen: soft, non-tender; bowel sounds normal; no masses,  no organomegaly Pelvic: external genitalia normal, rectovaginal septum normal.  Vagina without discharge.  Cervix normal appearing, no lesions and no motion tenderness.  Uterus mobile, nontender, normal shape and size. Good descensus.  Adnexae non-palpable, nontender bilaterally.  Extremities: extremities normal, atraumatic, no cyanosis or edema Neurologic: Grossly normal   Labs: Lab Results  Component Value Date   WBC 3.8 05/16/2017   HGB 13.7 05/16/2017   HCT 41.0 05/16/2017   MCV 82 05/16/2017   PLT 306 05/16/2017    Assessment:   Fibroid uterus Abnormal uterine bleeding Iron deficiency anemia History of blood transfusion Morbid obesity  Plan:   Patient desires surgical management with hysterectomy.  The risks of surgery were discussed in detail with the patient including but not  limited to: bleeding which may require transfusion or reoperation; infection which may require prolonged hospitalization or re-hospitalization and antibiotic therapy; injury to bowel, bladder, ureters and major vessels or other surrounding organs; need for additional procedures including laparotomy; thromboembolic phenomenon, incisional problems and other postoperative or anesthesia complications.  Patient was told that the likelihood that her condition and symptoms will be treated effectively with this surgical management was very high; the postoperative expectations were also discussed in detail. The patient also understands the alternative treatment options which were discussed in full. All questions were answered.  She was told that she will be contacted by our surgical scheduler regarding the time and date of her surgery; routine preoperative instructions of having nothing to eat or drink after midnight on the day prior to surgery and also coming to the hospital 1.5 hours prior to her time of surgery were also emphasized.  She was told she may be called for a preoperative appointment about a week prior to surgery and will be given further preoperative instructions at that visit. Printed patient education handouts about the procedure were given to the patient to review at home.  Based on patient's exam, she is a candidate for LAVH with bilateral salpingectomy (patient desires to preserve ovarian function). Also counseled on possible TAH.  Scheduled for 07/15/2017.   - Currently hemodynamically stable with a Hgb of ~13.   A total of 20 minutes were spent face-to-face with the patient during this encounter and over half of that time involved counseling and coordination of care.   Rubie Maid, MD [facility]

## 2017-07-10 NOTE — Patient Instructions (Signed)
Your procedure is scheduled on: July 15, 2017 [facility] ) Report to Same Day Surgery. ([facility]ICAL [facility] ) SECOND FLOOR To find out your arrival time please call [phone] between 1PM - 3PM on July 12, 2017 (FRIDAY )  Remember: Instructions that are not followed completely may result in serious medical risk, up to and including death, or upon the discretion of your surgeon and anesthesiologist your surgery may need to be rescheduled.     _X__ 1. Do not eat food after midnight the night before your procedure.                 No gum chewing or hard candies. You may drink clear liquids up to 2 hours                 before you are scheduled to arrive for your surgery- DO not drink clear                 liquids within 2 hours of the start of your surgery.                 Clear Liquids include:  water, apple juice without pulp, clear carbohydrate                 drink such as Clearfast of Gartorade, Black Coffee or Tea (Do not add                 anything to coffee or tea).     _X__ 2.  No Alcohol for 24 hours before or after surgery.   _X__ 3.  Do Not Smoke or use e-cigarettes For 24 Hours Prior to Your Surgery.                 Do not use any chewable tobacco products for at least 6 hours prior to                 surgery.  ____  4.  Bring all medications with you on the day of surgery if instructed.   __X__  5.  Notify your doctor if there is any change in your medical condition      (cold, fever, infections).     Do not wear jewelry, make-up, hairpins, clips or nail polish. Do not wear lotions, powders, or perfumes. Do not shave 48 hours prior to surgery. Men may shave face and neck. Do not bring valuables to the hospital.    [facility] is not responsible for any belongings or valuables.  Contacts, dentures or bridgework may not be worn into surgery. Leave your suitcase in the car. After surgery it may be brought to your room. For patients admitted to the  hospital, discharge time is determined by your treatment team.   Patients discharged the day of surgery will not be allowed to drive home.   Please read over the following fact sheets that you were given:             ____ Take these medicines the morning of surgery with A SIP OF WATER:    1.   2.   3.   4.  5.  6.  ____ Fleet Enema (as directed)   _X___ Use CHG Soap as directed  ____ Use inhalers on the day of surgery  ____ Stop metformin 2 days prior to surgery    ____ Take 1/2 of usual insulin dose the night before surgery. No insulin the morning  of surgery.   _X___ Stop Coumadin/Plavix/aspirin on (NO ASPIRIN 0  __X__ Stop Anti-inflammatories on (NO ASPIRIN PRODUCTS, MOTRIN, ALEVE, IBUPROFEN, GOODY'S BC'S, ADVIL EXCEDRIN ) OK TO TAKE TYLENOL IF NEEDED    __X__ Stop supplements until after surgery.  (STOP PHENDIMETRAZINE NOW )  ____ Bring C-Pap to the hospital.

## 2017-07-11 LAB — RPR: RPR Ser Ql: NONREACTIVE

## 2017-07-11 LAB — T4, FREE: FREE T4: 0.9 ng/dL (ref 0.61–1.12)

## 2017-07-11 LAB — TSH: TSH: 1.258 u[IU]/mL (ref 0.350–4.500)

## 2017-07-14 MED ORDER — CEFAZOLIN SODIUM-DEXTROSE 2-4 GM/100ML-% IV SOLN
2.0000 g | INTRAVENOUS | Status: AC
  Administered 2017-07-15: 2 g via INTRAVENOUS

## 2017-07-15 ENCOUNTER — Other Ambulatory Visit: Payer: Self-pay

## 2017-07-15 ENCOUNTER — Ambulatory Visit: Payer: Commercial Managed Care - HMO | Admitting: Anesthesiology

## 2017-07-15 ENCOUNTER — Encounter
Admission: RE | Disposition: A | Discharge status: Home / Self Care | Payer: Self-pay | Source: Ambulatory Visit | Attending: Obstetrics and Gynecology

## 2017-07-15 ENCOUNTER — Observation Stay
Admission: RE | Admit: 2017-07-15 | Discharge: 2017-07-17 | Disposition: A | Discharge status: Home / Self Care | Payer: Commercial Managed Care - HMO | Source: Ambulatory Visit | Attending: Obstetrics and Gynecology | Admitting: Obstetrics and Gynecology

## 2017-07-15 ENCOUNTER — Encounter: Payer: Self-pay | Admitting: *Deleted

## 2017-07-15 DIAGNOSIS — Z6841 Body Mass Index (BMI) 40.0 and over, adult: Secondary | ICD-10-CM | POA: Diagnosis not present

## 2017-07-15 DIAGNOSIS — D251 Intramural leiomyoma of uterus: Principal | ICD-10-CM | POA: Insufficient documentation

## 2017-07-15 DIAGNOSIS — N838 Other noninflammatory disorders of ovary, fallopian tube and broad ligament: Secondary | ICD-10-CM | POA: Diagnosis not present

## 2017-07-15 DIAGNOSIS — Z90710 Acquired absence of both cervix and uterus: Secondary | ICD-10-CM | POA: Diagnosis present

## 2017-07-15 DIAGNOSIS — Z885 Allergy status to narcotic agent status: Secondary | ICD-10-CM | POA: Diagnosis not present

## 2017-07-15 DIAGNOSIS — Z79899 Other long term (current) drug therapy: Secondary | ICD-10-CM | POA: Insufficient documentation

## 2017-07-15 DIAGNOSIS — N939 Abnormal uterine and vaginal bleeding, unspecified: Secondary | ICD-10-CM | POA: Diagnosis not present

## 2017-07-15 DIAGNOSIS — N72 Inflammatory disease of cervix uteri: Secondary | ICD-10-CM | POA: Diagnosis not present

## 2017-07-15 DIAGNOSIS — D259 Leiomyoma of uterus, unspecified: Secondary | ICD-10-CM | POA: Diagnosis present

## 2017-07-15 HISTORY — PX: LAPAROSCOPIC VAGINAL HYSTERECTOMY WITH SALPINGECTOMY: SHX6680

## 2017-07-15 LAB — POCT PREGNANCY, URINE: Preg Test, Ur: NEGATIVE

## 2017-07-15 SURGERY — HYSTERECTOMY, VAGINAL, LAPAROSCOPY-ASSISTED, WITH SALPINGECTOMY
Anesthesia: General | Laterality: Bilateral

## 2017-07-15 MED ORDER — FAMOTIDINE IN NACL 20-0.9 MG/50ML-% IV SOLN
20.0000 mg | INTRAVENOUS | Status: DC
  Administered 2017-07-15 – 2017-07-16 (×2): 20 mg via INTRAVENOUS
  Filled 2017-07-15 (×2): qty 50

## 2017-07-15 MED ORDER — PROPOFOL 10 MG/ML IV BOLUS
INTRAVENOUS | Status: AC
  Filled 2017-07-15: qty 20

## 2017-07-15 MED ORDER — ESMOLOL HCL 100 MG/10ML IV SOLN
INTRAVENOUS | Status: DC | PRN
  Administered 2017-07-15: 30 mg via INTRAVENOUS

## 2017-07-15 MED ORDER — DOCUSATE SODIUM 100 MG PO CAPS
100.0000 mg | ORAL_CAPSULE | Freq: Two times a day (BID) | ORAL | Status: DC
  Administered 2017-07-15 – 2017-07-17 (×4): 100 mg via ORAL
  Filled 2017-07-15 (×4): qty 1

## 2017-07-15 MED ORDER — ONDANSETRON HCL 4 MG/2ML IJ SOLN: 4.0000 mg | Freq: Four times a day (QID) | INTRAMUSCULAR | Status: DC | PRN

## 2017-07-15 MED ORDER — SUCCINYLCHOLINE CHLORIDE 20 MG/ML IJ SOLN
INTRAMUSCULAR | Status: DC | PRN
  Administered 2017-07-15: 100 mg via INTRAVENOUS

## 2017-07-15 MED ORDER — FENTANYL CITRATE (PF) 250 MCG/5ML IJ SOLN
INTRAMUSCULAR | Status: AC
  Filled 2017-07-15: qty 5

## 2017-07-15 MED ORDER — KETOROLAC TROMETHAMINE 30 MG/ML IJ SOLN
INTRAMUSCULAR | Status: DC | PRN
  Administered 2017-07-15: 30 mg via INTRAVENOUS

## 2017-07-15 MED ORDER — MAGNESIUM CITRATE PO SOLN
1.0000 | Freq: Once | ORAL | Status: AC | PRN
  Administered 2017-07-17: 1 via ORAL
  Filled 2017-07-15 (×3): qty 296

## 2017-07-15 MED ORDER — ONDANSETRON HCL 4 MG PO TABS: 4.0000 mg | ORAL_TABLET | Freq: Four times a day (QID) | ORAL | Status: DC | PRN

## 2017-07-15 MED ORDER — DIPHENHYDRAMINE HCL 50 MG/ML IJ SOLN: 25.0000 mg | Freq: Four times a day (QID) | INTRAMUSCULAR | Status: DC | PRN

## 2017-07-15 MED ORDER — ACETAMINOPHEN 10 MG/ML IV SOLN
INTRAVENOUS | Status: DC | PRN
  Administered 2017-07-15: 1000 mg via INTRAVENOUS

## 2017-07-15 MED ORDER — HYDROMORPHONE HCL 1 MG/ML IJ SOLN
0.2000 mg | INTRAMUSCULAR | Status: DC | PRN
  Administered 2017-07-15 (×2): 0.6 mg via INTRAVENOUS
  Administered 2017-07-16: 0.2 mg via INTRAVENOUS
  Administered 2017-07-16: 0.4 mg via INTRAVENOUS
  Filled 2017-07-15 (×4): qty 1

## 2017-07-15 MED ORDER — FENTANYL CITRATE (PF) 100 MCG/2ML IJ SOLN
INTRAMUSCULAR | Status: AC
Start: 2017-07-15 — End: 2017-07-15
  Administered 2017-07-15: 25 ug via INTRAVENOUS
  Filled 2017-07-15: qty 2

## 2017-07-15 MED ORDER — FAMOTIDINE 20 MG PO TABS
20.0000 mg | ORAL_TABLET | Freq: Once | ORAL | Status: AC
  Administered 2017-07-15: 20 mg via ORAL

## 2017-07-15 MED ORDER — IBUPROFEN 600 MG PO TABS
600.0000 mg | ORAL_TABLET | Freq: Four times a day (QID) | ORAL | Status: DC | PRN
  Administered 2017-07-16 – 2017-07-17 (×4): 600 mg via ORAL
  Filled 2017-07-15 (×4): qty 1

## 2017-07-15 MED ORDER — KETOROLAC TROMETHAMINE 30 MG/ML IJ SOLN
30.0000 mg | Freq: Once | INTRAMUSCULAR | Status: AC
  Administered 2017-07-15: 30 mg via INTRAVENOUS
  Filled 2017-07-15: qty 1

## 2017-07-15 MED ORDER — SODIUM CHLORIDE FLUSH 0.9 % IV SOLN
INTRAVENOUS | Status: AC
  Filled 2017-07-15: qty 30

## 2017-07-15 MED ORDER — PANTOPRAZOLE SODIUM 40 MG PO TBEC
40.0000 mg | DELAYED_RELEASE_TABLET | Freq: Every day | ORAL | Status: DC
  Administered 2017-07-15 – 2017-07-17 (×3): 40 mg via ORAL
  Filled 2017-07-15 (×3): qty 1

## 2017-07-15 MED ORDER — BISACODYL 10 MG RE SUPP
10.0000 mg | Freq: Every day | RECTAL | Status: DC | PRN
  Filled 2017-07-15: qty 1

## 2017-07-15 MED ORDER — SIMETHICONE 80 MG PO CHEW: 80.0000 mg | CHEWABLE_TABLET | Freq: Four times a day (QID) | ORAL | Status: DC | PRN

## 2017-07-15 MED ORDER — ROCURONIUM BROMIDE 100 MG/10ML IV SOLN
INTRAVENOUS | Status: DC | PRN
  Administered 2017-07-15: 20 mg via INTRAVENOUS
  Administered 2017-07-15: 50 mg via INTRAVENOUS

## 2017-07-15 MED ORDER — FAMOTIDINE 20 MG PO TABS
ORAL_TABLET | ORAL | Status: AC
  Administered 2017-07-15: 20 mg via ORAL
  Filled 2017-07-15: qty 1

## 2017-07-15 MED ORDER — DEXAMETHASONE SODIUM PHOSPHATE 10 MG/ML IJ SOLN
INTRAMUSCULAR | Status: DC | PRN
  Administered 2017-07-15: 10 mg via INTRAVENOUS

## 2017-07-15 MED ORDER — ACETAMINOPHEN NICU IV SYRINGE 10 MG/ML
INTRAVENOUS | Status: AC
  Filled 2017-07-15: qty 1

## 2017-07-15 MED ORDER — PROPOFOL 10 MG/ML IV BOLUS
INTRAVENOUS | Status: DC | PRN
  Administered 2017-07-15: 180 mg via INTRAVENOUS

## 2017-07-15 MED ORDER — ONDANSETRON HCL 4 MG/2ML IJ SOLN
INTRAMUSCULAR | Status: DC | PRN
  Administered 2017-07-15 (×2): 4 mg via INTRAVENOUS

## 2017-07-15 MED ORDER — CEFAZOLIN SODIUM-DEXTROSE 2-4 GM/100ML-% IV SOLN
INTRAVENOUS | Status: AC
  Filled 2017-07-15: qty 100

## 2017-07-15 MED ORDER — ALUM & MAG HYDROXIDE-SIMETH 200-200-20 MG/5ML PO SUSP: 30.0000 mL | ORAL | Status: DC | PRN

## 2017-07-15 MED ORDER — VASOPRESSIN 20 UNIT/ML IJ SOLN
INTRAMUSCULAR | Status: DC | PRN
  Administered 2017-07-15: 6 [IU] via INTRAMUSCULAR

## 2017-07-15 MED ORDER — SENNOSIDES-DOCUSATE SODIUM 8.6-50 MG PO TABS: 1.0000 | ORAL_TABLET | Freq: Every evening | ORAL | Status: DC | PRN

## 2017-07-15 MED ORDER — FENTANYL CITRATE (PF) 100 MCG/2ML IJ SOLN
25.0000 ug | INTRAMUSCULAR | Status: AC | PRN
  Administered 2017-07-15 (×6): 25 ug via INTRAVENOUS

## 2017-07-15 MED ORDER — MIDAZOLAM HCL 2 MG/2ML IJ SOLN
INTRAMUSCULAR | Status: DC | PRN
  Administered 2017-07-15: 2 mg via INTRAVENOUS

## 2017-07-15 MED ORDER — LACTATED RINGERS IV SOLN
INTRAVENOUS | Status: DC
  Administered 2017-07-15: 09:00:00 via INTRAVENOUS

## 2017-07-15 MED ORDER — LACTATED RINGERS IV SOLN
INTRAVENOUS | Status: DC | PRN
  Administered 2017-07-15 (×2): via INTRAVENOUS

## 2017-07-15 MED ORDER — ONDANSETRON HCL 4 MG/2ML IJ SOLN: 4.0000 mg | Freq: Once | INTRAMUSCULAR | Status: DC | PRN

## 2017-07-15 MED ORDER — FENTANYL CITRATE (PF) 100 MCG/2ML IJ SOLN
INTRAMUSCULAR | Status: DC | PRN
  Administered 2017-07-15: 100 ug via INTRAVENOUS
  Administered 2017-07-15: 50 ug via INTRAVENOUS

## 2017-07-15 MED ORDER — MENTHOL 3 MG MT LOZG
1.0000 | LOZENGE | OROMUCOSAL | Status: DC | PRN
  Filled 2017-07-15: qty 9

## 2017-07-15 MED ORDER — LIDOCAINE HCL (CARDIAC) 20 MG/ML IV SOLN
INTRAVENOUS | Status: DC | PRN
  Administered 2017-07-15: 100 mg via INTRAVENOUS

## 2017-07-15 MED ORDER — ZOLPIDEM TARTRATE 5 MG PO TABS: 5.0000 mg | ORAL_TABLET | Freq: Every evening | ORAL | Status: DC | PRN

## 2017-07-15 MED ORDER — LACTATED RINGERS IV SOLN
INTRAVENOUS | Status: DC
  Administered 2017-07-15 – 2017-07-16 (×3): via INTRAVENOUS
  Administered 2017-07-17: 125 mL/h via INTRAVENOUS

## 2017-07-15 MED ORDER — SUGAMMADEX SODIUM 200 MG/2ML IV SOLN
INTRAVENOUS | Status: DC | PRN
  Administered 2017-07-15: 428 mg via INTRAVENOUS

## 2017-07-15 MED ORDER — VASOPRESSIN 20 UNIT/ML IV SOLN
INTRAVENOUS | Status: AC
  Filled 2017-07-15: qty 1

## 2017-07-15 MED ORDER — FENTANYL CITRATE (PF) 100 MCG/2ML IJ SOLN
INTRAMUSCULAR | Status: AC
  Administered 2017-07-15: 25 ug via INTRAVENOUS
  Filled 2017-07-15: qty 2

## 2017-07-15 MED ORDER — PHENYLEPHRINE HCL 10 MG/ML IJ SOLN
INTRAMUSCULAR | Status: DC | PRN
  Administered 2017-07-15 (×5): 50 ug via INTRAVENOUS

## 2017-07-15 MED ORDER — MIDAZOLAM HCL 2 MG/2ML IJ SOLN
INTRAMUSCULAR | Status: AC
Start: 2017-07-15 — End: ?
  Filled 2017-07-15: qty 2

## 2017-07-15 MED ORDER — ESMOLOL HCL 100 MG/10ML IV SOLN
INTRAVENOUS | Status: AC
  Filled 2017-07-15: qty 10

## 2017-07-15 MED ORDER — OXYCODONE-ACETAMINOPHEN 5-325 MG PO TABS
1.0000 | ORAL_TABLET | ORAL | Status: DC | PRN
  Administered 2017-07-15 (×2): 1 via ORAL
  Administered 2017-07-16 – 2017-07-17 (×6): 2 via ORAL
  Filled 2017-07-15 (×5): qty 2
  Filled 2017-07-15 (×2): qty 1
  Filled 2017-07-15: qty 2

## 2017-07-15 MED ORDER — GLYCOPYRROLATE 0.2 MG/ML IJ SOLN
INTRAMUSCULAR | Status: DC | PRN
Start: 2017-07-15 — End: 2017-07-15
  Administered 2017-07-15: 0.2 mg via INTRAVENOUS

## 2017-07-15 MED ORDER — DIPHENHYDRAMINE HCL 25 MG PO CAPS: 25.0000 mg | ORAL_CAPSULE | Freq: Four times a day (QID) | ORAL | Status: DC | PRN

## 2017-07-15 SURGICAL SUPPLY — 51 items
APPLICATOR ARISTA FLEXITIP XL (MISCELLANEOUS) ×3 IMPLANT
BAG URO DRAIN 2000ML W/SPOUT (MISCELLANEOUS) ×3 IMPLANT
BLADE SURG 15 STRL LF DISP TIS (BLADE) ×1 IMPLANT
BLADE SURG 15 STRL SS (BLADE) ×2
BLADE SURG SZ10 CARB STEEL (BLADE) ×3 IMPLANT
BLADE SURG SZ11 CARB STEEL (BLADE) ×3 IMPLANT
CATH FOLEY 2WAY  5CC 16FR (CATHETERS) ×2
CATH URTH 16FR FL 2W BLN LF (CATHETERS) ×1 IMPLANT
CHLORAPREP W/TINT 26ML (MISCELLANEOUS) ×3 IMPLANT
CLOSURE WOUND 1/2 X4 (GAUZE/BANDAGES/DRESSINGS) ×1
DERMABOND ADVANCED (GAUZE/BANDAGES/DRESSINGS) ×2
DERMABOND ADVANCED .7 DNX12 (GAUZE/BANDAGES/DRESSINGS) ×1 IMPLANT
ELECT REM PT RETURN 9FT ADLT (ELECTROSURGICAL) ×3
ELECTRODE REM PT RTRN 9FT ADLT (ELECTROSURGICAL) ×1 IMPLANT
GAUZE PACK 2X3YD (MISCELLANEOUS) ×3 IMPLANT
GLOVE BIO SURGEON STRL SZ 6.5 (GLOVE) ×2 IMPLANT
GLOVE BIO SURGEON STRL SZ8 (GLOVE) ×6 IMPLANT
GLOVE BIO SURGEONS STRL SZ 6.5 (GLOVE) ×1
GLOVE INDICATOR 7.0 STRL GRN (GLOVE) ×6 IMPLANT
GLOVE INDICATOR 8.0 STRL GRN (GLOVE) ×6 IMPLANT
GOWN STRL REUS W/ TWL LRG LVL3 (GOWN DISPOSABLE) ×2 IMPLANT
GOWN STRL REUS W/TWL LRG LVL3 (GOWN DISPOSABLE) ×4
HANDLE YANKAUER SUCT BULB TIP (MISCELLANEOUS) ×3 IMPLANT
HEMOSTAT ARISTA ABSORB 3G PWDR (MISCELLANEOUS) ×3 IMPLANT
IRRIGATION STRYKERFLOW (MISCELLANEOUS) ×1 IMPLANT
IRRIGATOR STRYKERFLOW (MISCELLANEOUS) ×3
IV LACTATED RINGERS 1000ML (IV SOLUTION) ×3 IMPLANT
KIT RM TURNOVER CYSTO AR (KITS) ×3 IMPLANT
LABEL OR SOLS (LABEL) ×3 IMPLANT
NEEDLE SPNL 22GX3.5 QUINCKE BK (NEEDLE) ×6 IMPLANT
PACK BASIN MINOR ARMC (MISCELLANEOUS) ×3 IMPLANT
PACK GYN LAPAROSCOPIC (MISCELLANEOUS) ×3 IMPLANT
PAD OB MATERNITY 4.3X12.25 (PERSONAL CARE ITEMS) ×3 IMPLANT
SCALPEL HARMONIC ACE (MISCELLANEOUS) ×3 IMPLANT
SCISSORS METZENBAUM CVD 33 (INSTRUMENTS) IMPLANT
SHEARS HARMONIC ACE PLUS 36CM (ENDOMECHANICALS) ×3 IMPLANT
SLEEVE ENDOPATH XCEL 5M (ENDOMECHANICALS) ×3 IMPLANT
SPONGE LAP 18X18 5 PK (GAUZE/BANDAGES/DRESSINGS) ×3 IMPLANT
SPONGE XRAY 4X4 16PLY STRL (MISCELLANEOUS) ×6 IMPLANT
STRIP CLOSURE SKIN 1/2X4 (GAUZE/BANDAGES/DRESSINGS) ×2 IMPLANT
SUT VIC AB 0 CT1 27 (SUTURE) ×2
SUT VIC AB 0 CT1 27XCR 8 STRN (SUTURE) ×1 IMPLANT
SUT VIC AB 0 CT1 36 (SUTURE) ×3 IMPLANT
SUT VIC AB 0 CT2 27 (SUTURE) ×3 IMPLANT
SUT VIC AB 2-0 CT1 (SUTURE) ×6 IMPLANT
SUT VIC AB 4-0 FS2 27 (SUTURE) ×3 IMPLANT
SYR CONTROL 10ML (SYRINGE) ×3 IMPLANT
SYRINGE 10CC LL (SYRINGE) ×3 IMPLANT
TROCAR 5M 150ML BLDLS (TROCAR) ×3 IMPLANT
TROCAR XCEL NON-BLD 5MMX100MML (ENDOMECHANICALS) ×3 IMPLANT
TUBING INSUF HEATED (TUBING) ×3 IMPLANT

## 2017-07-15 NOTE — Anesthesia Post-op Follow-up Note (Signed)
Anesthesia QCDR form completed.        

## 2017-07-15 NOTE — Anesthesia Procedure Notes (Signed)
Procedure Name: Intubation Date/Time: 07/15/2017 10:00 AM Performed by: Timoteo Expose, CRNA Pre-anesthesia Checklist: Patient identified, Suction available, Emergency Drugs available, Patient being monitored and Timeout performed Patient Re-evaluated:Patient Re-evaluated prior to induction Oxygen Delivery Method: Circle system utilized Preoxygenation: Pre-oxygenation with 100% oxygen Induction Type: IV induction Ventilation: Mask ventilation without difficulty Laryngoscope Size: Mac and 4 Grade View: Grade II Tube type: Oral Tube size: 7.5 mm Number of attempts: 1 Airway Equipment and Method: Stylet Placement Confirmation: ETT inserted through vocal cords under direct vision,  positive ETCO2,  breath sounds checked- equal and bilateral and CO2 detector Secured at: 22 cm Tube secured with: Tape Dental Injury: Teeth and Oropharynx as per pre-operative assessment

## 2017-07-15 NOTE — H&P (Signed)
UPDATE TO PREVIOUS HISTORY AND PHYSICAL  The patient has been seen and examined.  H&P is up to date, no changes noted.  Sharon Marquez is a 46 y.o. G1P0101 here for surgical management of abnormal uterine bleeding, fibroid uterus, and h/o anemia. Planned surgery is LAVH with bilateral salpingectomy. She denies any questions. Patient can proceed to the OR for scheduled procedure.   Rubie Maid, MD 07/15/2017 8:58 AM

## 2017-07-15 NOTE — Anesthesia Preprocedure Evaluation (Signed)
Anesthesia Evaluation  Patient identified by MRN, date of birth, ID band Patient awake    Reviewed: Allergy & Precautions, H&P , NPO status , Patient's Chart, lab work & pertinent test results, reviewed documented beta blocker date and time   Airway Mallampati: III  TM Distance: >3 FB Neck ROM: full    Dental  (+) Teeth Intact   Pulmonary neg pulmonary ROS,    Pulmonary exam normal        Cardiovascular Exercise Tolerance: Good negative cardio ROS Normal cardiovascular exam Rhythm:regular Rate:Normal     Neuro/Psych negative neurological ROS  negative psych ROS   GI/Hepatic negative GI ROS, Neg liver ROS,   Endo/Other  negative endocrine ROSMorbid obesity  Renal/GU negative Renal ROS  negative genitourinary   Musculoskeletal   Abdominal   Peds  Hematology negative hematology ROS (+) anemia ,   Anesthesia Other Findings Past Medical History: No date: Anemia No date: Blood transfusion without reported diagnosis No date: Family history of adverse reaction to anesthesia     Comment:  "mother has difficulty waking up after surgery ." 2014: Fibroid uterus 2014, 2015: History of blood transfusion     Comment:  for symptomatic anemia No date: Vitamin B deficiency Past Surgical History: No date: CHOLECYSTECTOMY BMI    Body Mass Index:  41.81 kg/m     Reproductive/Obstetrics negative OB ROS                             Anesthesia Physical Anesthesia Plan  ASA: III  Anesthesia Plan: General ETT   Post-op Pain Management:    Induction:   PONV Risk Score and Plan: 4 or greater and Dexamethasone, Midazolam and Ondansetron  Airway Management Planned:   Additional Equipment:   Intra-op Plan:   Post-operative Plan:   Informed Consent: I have reviewed the patients History and Physical, chart, labs and discussed the procedure including the risks, benefits and alternatives for the  proposed anesthesia with the patient or authorized representative who has indicated his/her understanding and acceptance.   Dental Advisory Given  Plan Discussed with: CRNA  Anesthesia Plan Comments:         Anesthesia Quick Evaluation

## 2017-07-15 NOTE — Transfer of Care (Signed)
Immediate Anesthesia Transfer of Care Note  Patient: Sharon Marquez  Procedure(s) Performed: LAPAROSCOPIC ASSISTED VAGINAL HYSTERECTOMY WITH BILATERAL SALPINGECTOMY (Bilateral )  Patient Location: PACU  Anesthesia Type:General  Level of Consciousness: awake  Airway & Oxygen Therapy: Patient Spontanous Breathing  Post-op Assessment: Report given to RN  Post vital signs: Reviewed  Last Vitals:  Vitals:   07/15/17 0832  BP: 134/81  Pulse: 65  Resp: 16  Temp: 37.1 C  SpO2: 100%    Last Pain:  Vitals:   07/15/17 0832  TempSrc: Oral         Complications: No apparent anesthesia complications

## 2017-07-15 NOTE — Op Note (Signed)
Procedure(s): LAPAROSCOPIC ASSISTED VAGINAL HYSTERECTOMY WITH BILATERAL SALPINGECTOMY Procedure Note  Sharon Marquez female 46 y.o. 07/15/2017  Indications: The patient is a 46 y.o. G57P0101 female with fibroid uterus, h/o abnormal uterine bleeding, h/o symptomatic anemia requiring blood transfusion, morbid obesity  Pre-operative Diagnosis: Ffibroid uterus, h/o abnormal uterine bleeding, h/o symptomatic anemia requiring blood transfusion, morbid obesity  Post-operative Diagnosis: Same  Surgeon: Rubie Maid, MD  Assistants: Malachi Paradise, MD  Anesthesia: General endotracheal anesthesia  Findings: The uterus was sounded to 6.5 cm. Fallopian tubes and ovaries appeared normal.  There was a large posterior-fundal fibroid, ~ 5 cm, appeared submucosal.   Procedure Details: The patient was seen in the Holding Room. The risks, benefits, complications, treatment options, and expected outcomes were discussed with the patient.  The patient concurred with the proposed plan, giving informed consent.  The site of surgery properly noted/marked. The patient was taken to the Operating Room, identified as Sharon Marquez and the procedure verified as Procedure(s) (LRB): LAPAROSCOPIC ASSISTED VAGINAL HYSTERECTOMY WITH BILATERAL SALPINGECTOMY (Bilateral). A Time Out was held and the above information confirmed.  She was then placed under general anesthesia without difficulty. She was placed in the dorsal lithotomy position, and was prepped and draped in a sterile manner.  A foley catheter was placed.  A uterine manipulator was then advanced into the uterus .  After an adequate timeout was performed, attention was turned to the abdomen where an umbilical incision was made with the scalpel.  The Optiview 5-mm trocar and sleeve were then advanced without difficulty with the laparoscope under direct visualization into the abdomen.  The abdomen was then insufflated with carbon dioxide gas and adequate  pneumoperitoneum was obtained.  Bilateral 5-mm lower quadrant ports were then placed under direct visualization.  A survey of the patient's pelvis and abdomen revealed the findings as above.  The fallopian tube on the left was grasped and elevated.  The Harmonic scalpel was then used to ligate and coagulate the mesosalpinx and transect the fallopian tube at the level of the cornua. The fallopian tube was removed through a lateral port site and sent to pathology. Attention was turned to the utero-ovarian ligament on the patient's right side, which was clamped using the Harmonic scalpel and ligated.  The round and broad ligaments were then clamped and transected with the Harmonic scalpel.  The ureter were noted to be safely away from the area of dissection.  The uterine artery was then skeletonized and a bladder flap was created.  The bladder was then bluntly dissected off the lower uterine segment.  At this point, attention was turned to the right uterine vessels, which were clamped and ligated using the Kleppinger device.  Attention was then turned to the patient's left side, which was treated in a similar manner by taking the fallopian tube, utero-ovarian ligament, the round ligament and the broad ligaments and the bladder flap creation was completed. The left uterine vessels were also clamped and transected in a similar fashion. The decision was made to leave the trocars in place and proceed with completing the hysterectomy via the vaginal route .  Attention was then turned to her pelvis.  A weighted speculum was then placed in the vagina, and the anterior and posterior lips of the cervix were grasped bilaterally with tenaculums.  The cervix was then injected circumferentially with Vasopression solution to maintain hemostasis.  The cervix was then circumferentially incised, and the posterior cul-de-sac was entered sharply without difficulty.  A long weighted speculum  was inserted into the posterior cul-de-sac.  The Heaney clamp was then used to clamp the uterosacral ligaments on either side.  They were then cut and sutured ligated with 0 Vicryl, and were held with a tag for later identification. Of note, all sutures used in this case were 0 Vicryl unless otherwise noted.   The cardinal ligaments were then clamped, cut and ligated bilaterally.  At this point, full entry into the anterior cul-de-sac was made, no injury to the bladder was noted.  The uterus was freed from all ligaments and was then delivered and sent to pathology.  The vaginal cuff was then reperitonealized using a running locked suture of 0-Vicryl to tamponade bleeding from the posterior cuff.  The vaginal cuff was then closed in an interrupted fashion using 2-0 Vicryl with care given to incorporate the uterosacral pedicles bilaterally.  All instruments were then removed from the pelvis.   Attention was then returned to her abdomen which was insufflated again with carbon dioxide gas.  The laparoscope was used to survey the operative site, and there was some slight oozing on the vaginal cuff which was controlled using coagulation.  Arista was then placed over the operative site to help with hemostasis.  No intraoperative injury to other surrounding organs was noted.  The abdomen was desufflated and all instruments were then removed from the patient's abdomen.   All skin incisions were closed with Dermabond.   There was noted to be a slow steady trickle of blood from the vagina, and so the decision was made to place a vaginal packing.  The patient tolerated the procedures well.  All instruments, needles, and sponge counts were correct x 2. The patient was taken to the recovery room awake, extubated and in stable condition.    Estimated Blood Loss:  400 ml      Drains: foley catheterization to gravity with  300 ml of clear urine         Total IV Fluids:  400  ml  Specimens: Uterus with cervix, and fallopian tubes         Implants: None          Complications:  None; patient tolerated the procedure well.         Disposition: PACU - hemodynamically stable.         Condition: stable   Rubie Maid, MD [facility]

## 2017-07-15 NOTE — Plan of Care (Signed)
Pt moving in bed; pt using IS; pt tolerating graham crackers; diet advanced to regular per MD order; RN to remove foley and vaginal packing in a.m. per MD order

## 2017-07-16 DIAGNOSIS — D251 Intramural leiomyoma of uterus: Secondary | ICD-10-CM | POA: Diagnosis not present

## 2017-07-16 LAB — CBC
HEMATOCRIT: 32.9 % — AB (ref 35.0–47.0)
HEMOGLOBIN: 11.1 g/dL — AB (ref 12.0–16.0)
MCH: 27 pg (ref 26.0–34.0)
MCHC: 33.7 g/dL (ref 32.0–36.0)
MCV: 80 fL (ref 80.0–100.0)
Platelets: 265 10*3/uL (ref 150–440)
RBC: 4.12 MIL/uL (ref 3.80–5.20)
RDW: 15.1 % — ABNORMAL HIGH (ref 11.5–14.5)
WBC: 8.5 10*3/uL (ref 3.6–11.0)

## 2017-07-16 LAB — SURGICAL PATHOLOGY

## 2017-07-16 LAB — CREATININE, SERUM: CREATININE: 0.34 mg/dL — AB (ref 0.44–1.00)

## 2017-07-16 MED ORDER — AMMONIA AROMATIC IN INHA
RESPIRATORY_TRACT | Status: AC
  Filled 2017-07-16: qty 10

## 2017-07-16 MED ORDER — SIMETHICONE 80 MG PO CHEW: 80.0000 mg | CHEWABLE_TABLET | Freq: Four times a day (QID) | ORAL | 0 refills | Status: DC | PRN

## 2017-07-16 MED ORDER — OXYCODONE-ACETAMINOPHEN 5-325 MG PO TABS: 1.0000 | ORAL_TABLET | Freq: Four times a day (QID) | ORAL | 0 refills | Status: DC | PRN

## 2017-07-16 MED ORDER — IBUPROFEN 800 MG PO TABS: 800.0000 mg | ORAL_TABLET | Freq: Three times a day (TID) | ORAL | 1 refills | Status: DC | PRN

## 2017-07-16 MED ORDER — DOCUSATE SODIUM 100 MG PO CAPS: 100.0000 mg | ORAL_CAPSULE | Freq: Two times a day (BID) | ORAL | 2 refills | Status: DC | PRN

## 2017-07-16 NOTE — Discharge Instructions (Signed)
General Gynecological Post-Operative Instructions You may expect to feel dizzy, weak, and drowsy for as long as 24 hours after receiving the medicine that made you sleep (anesthetic).  Do not drive a car, ride a bicycle, participate in physical activities, or take public transportation until you are done taking narcotic pain medicines or as directed by your doctor.  Do not drink alcohol or take tranquilizers.  Do not take medicine that has not been prescribed by your doctor.  Do not sign important papers or make important decisions while on narcotic pain medicines.  Have a responsible person with you.  CARE OF INCISION  Keep incision clean and dry. Take showers instead of baths until your doctor gives you permission to take baths.  Avoid heavy lifting (more than 10 pounds/4.5 kilograms), pushing, or pulling.  Avoid activities that may risk injury to your surgical site.  No sexual intercourse or placement of anything in the vagina for 6 weeks or as instructed by your doctor. If you have tubes coming from the wound site, check with your doctor regarding appropriate care of the tubes. Only take prescription or over-the-counter medicines  for pain, discomfort, or fever as directed by your doctor. Do not take aspirin. It can make you bleed. Take medicines (antibiotics) that kill germs if they are prescribed for you.  Call the office or go to the MAU if:  You feel sick to your stomach (nauseous).  You start to throw up (vomit).  You have trouble eating or drinking.  You have an oral temperature above 101.  You have constipation that is not helped by adjusting diet or increasing fluid intake. Pain medicines are a common cause of constipation.  You have any other concerns. SEEK IMMEDIATE MEDICAL CARE IF:  You have persistent dizziness.  You have difficulty breathing or a congested sounding (croupy) cough.  You have an oral temperature above 102.5, not controlled by medicine.  There is increasing  pain or tenderness near or in the surgical site.

## 2017-07-16 NOTE — Discharge Summary (Signed)
Gynecology Physician Postoperative Discharge Summary  Patient ID: Sharon Marquez MRN: [id number] DOB/AGE: [birth year] 46 y.o.  Admit Date: 07/15/2017 Discharge Date: 07/16/2017  Preoperative Diagnoses: Fibroid uterus, abnormal uterine bleeding, h/o anemia.   Procedures: Procedure(s) (LRB): LAPAROSCOPIC ASSISTED VAGINAL HYSTERECTOMY WITH BILATERAL SALPINGECTOMY (Bilateral)  Hospital Course:  Sharon Marquez is a 46 y.o. G1P0101  admitted for scheduled surgery.  She underwent the procedures as mentioned above, her operation was uncomplicated. For further details about surgery, please refer to the operative report. Patient had an uncomplicated postoperative course. By time of discharge on POD#2, her pain was controlled on oral pain medications; she was ambulating, voiding without difficulty, tolerating regular diet and passing flatus. She was deemed stable for discharge to home.   Significant Labs: CBC Latest Ref Rng & Units 07/16/2017 07/10/2017 05/16/2017  WBC 3.6 - 11.0 K/uL 8.5 3.4(L) 3.8  Hemoglobin 12.0 - 16.0 g/dL 11.1(L) 12.4 13.7  Hematocrit 35.0 - 47.0 % 32.9(L) 38.5 41.0  Platelets 150 - 440 K/uL 265 287 306    Discharge Exam: Blood pressure (!) 115/58, pulse 68, temperature 98.6 F (37 C), temperature source Oral, resp. rate 18, height 5\' 3"  (1.6 m), weight 236 lb (107 kg), last menstrual period 07/07/2017, SpO2 100 %. General appearance: alert and no distress  Resp: clear to auscultation bilaterally  Cardio: regular rate and rhythm  GI: soft, non-tender; bowel sounds normal; no masses, no organomegaly.  Incision: C/D/I, no erythema, no drainage noted Pelvic: scant blood on pad  Extremities: extremities normal, atraumatic, no cyanosis or edema and Homans sign is negative, no sign of DVT  Discharged Condition: Stable  Disposition: 01-Home or Self Care   Allergies as of 07/16/2017      Reactions   Meloxicam Itching   Tramadol Itching, Swelling      Medication List     TAKE these medications   cyanocobalamin 1000 MCG/ML injection Commonly known as:  (VITAMIN B-12) Inject 1 mL (1,000 mcg total) into the muscle every 30 (thirty) days.   docusate sodium 100 MG capsule Commonly known as:  COLACE Take 1 capsule (100 mg total) 2 (two) times daily as needed by mouth.   FERRALET 90 PO Take 1 tablet 2 (two) times daily by mouth.   ibuprofen 800 MG tablet Commonly known as:  ADVIL,MOTRIN Take 1 tablet (800 mg total) every 8 (eight) hours as needed by mouth.   mometasone 50 MCG/ACT nasal spray Commonly known as:  NASONEX Place 2 sprays into the nose daily.   norethindrone 0.35 MG tablet Commonly known as:  MICRONOR,CAMILA,ERRIN Take 1 tablet (0.35 mg total) by mouth daily.   oxyCODONE-acetaminophen 5-325 MG tablet Commonly known as:  PERCOCET/ROXICET Take 1-2 tablets every 6 (six) hours as needed by mouth for severe pain (moderate to severe pain (when tolerating fluids)).   Phendimetrazine Tartrate 105 MG Cp24 Take 1 capsule (105 mg total) by mouth daily.   simethicone 80 MG chewable tablet Commonly known as:  MYLICON Chew 1 tablet (80 mg total) 4 (four) times daily as needed by mouth for flatulence.   Vitamin D (Ergocalciferol) 50000 units Caps capsule Commonly known as:  DRISDOL Take 1 capsule (50,000 Units total) by mouth 2 (two) times a week.      Follow-up Information    Rubie Maid, MD Follow up in 1 week(s).   Specialties:  Obstetrics and Gynecology, Radiology Why:  post-op check Contact information: [address] [place] [address] [phone]  Signed:  Rubie Maid, MD [facility] 07/17/2017 8:03 AM

## 2017-07-16 NOTE — Progress Notes (Signed)
Post-Operative Day # 1, s/p LAVH with bilateral salpingectomy  Subjective: no complaints, up ad lib, voiding, tolerating PO and + flatus  Objective: Temp:  [97.3 F (36.3 C)-98.8 F (37.1 C)] 98.6 F (37 C) (11/20 0715) Pulse Rate:  [60-84] 68 (11/20 0715) Resp:  [12-23] 18 (11/20 0715) BP: (108-141)/(55-81) 115/58 (11/20 0715) SpO2:  [95 %-100 %] 100 % (11/20 0715) Weight:  [236 lb (107 kg)] 236 lb (107 kg) (11/19 6834)  Physical Exam:  General: alert and no distress  Lungs: clear to auscultation bilaterally Heart: regular rate and rhythm, S1, S2 normal, no murmur, click, rub or gallop Abdomen: soft, non-tender; bowel sounds normal; no masses,  no organomegaly.  Incision: healing well, no significant drainage, no dehiscence, no significant erythema Pelvis:Bleeding: scant, Extremities: DVT Evaluation: No evidence of DVT seen on physical exam. Negative Homan's sign. No cords or calf tenderness. No significant calf/ankle edema.   CBC Latest Ref Rng & Units 07/16/2017 07/10/2017 05/16/2017  WBC 3.6 - 11.0 K/uL 8.5 3.4(L) 3.8  Hemoglobin 12.0 - 16.0 g/dL 11.1(L) 12.4 13.7  Hematocrit 35.0 - 47.0 % 32.9(L) 38.5 41.0  Platelets 150 - 440 K/uL 265 287 306    Lab Results  Component Value Date   CREATININE 0.34 (L) 07/16/2017   CREATININE 0.56 07/10/2017   CREATININE 0.63 10/16/2016     Assessment/Plan:  Doing well post-operatively.  Continue to encourage ambulation Advance diet as tolerated Can d/c home later today.     Rubie Maid, MD [facility]

## 2017-07-16 NOTE — Progress Notes (Signed)
RN in room to remove vaginal packing and foley catheter; pt stood at the bedside with RN present to pull up underwear; pt wanted to sit on side of bed (RN still present); pt said she started to feel "a little lightheaded"; RN assisted pt back to laying down in bed, encouraged pt to take slow deep breaths (in through your nose and out through your mouth); pt rubbing her eyes; RN asked "are you having blurry vision?"; pt stated that "i'm seeing some black spots"; RN applied nasal cannula and turned O2 on; pt getting 2L O2; after about 10 minutes the black spots were gone and the lightheadedness improved; RN reminded pt several times that the RN or the NT needs to assist pt out of bed when she feels the urge to urinate

## 2017-07-17 DIAGNOSIS — D251 Intramural leiomyoma of uterus: Secondary | ICD-10-CM | POA: Diagnosis not present

## 2017-07-17 NOTE — Anesthesia Postprocedure Evaluation (Signed)
Anesthesia Post Note  Patient: Sharon Marquez  Procedure(s) Performed: LAPAROSCOPIC ASSISTED VAGINAL HYSTERECTOMY WITH BILATERAL SALPINGECTOMY (Bilateral )  Patient location during evaluation: PACU Anesthesia Type: General Level of consciousness: awake and alert Pain management: pain level controlled Vital Signs Assessment: post-procedure vital signs reviewed and stable Respiratory status: spontaneous breathing, nonlabored ventilation, respiratory function stable and patient connected to nasal cannula oxygen Cardiovascular status: blood pressure returned to baseline and stable Postop Assessment: no apparent nausea or vomiting Anesthetic complications: no     Last Vitals:  Vitals:   07/17/17 0258 07/17/17 0833  BP:  119/62  Pulse:  69  Resp:    Temp: 37.3 C 36.7 C  SpO2:  99%    Last Pain:  Vitals:   07/17/17 1023  TempSrc:   PainSc: 6                  Molli Barrows

## 2017-07-23 ENCOUNTER — Ambulatory Visit (INDEPENDENT_AMBULATORY_CARE_PROVIDER_SITE_OTHER): Payer: 59 | Admitting: Obstetrics and Gynecology

## 2017-07-23 ENCOUNTER — Encounter: Payer: Self-pay | Admitting: Obstetrics and Gynecology

## 2017-07-23 ENCOUNTER — Other Ambulatory Visit: Payer: Self-pay

## 2017-07-23 VITALS — BP 123/81 | HR 77 | Ht 63.0 in | Wt 237.5 lb

## 2017-07-23 DIAGNOSIS — K5909 Other constipation: Secondary | ICD-10-CM

## 2017-07-23 DIAGNOSIS — Z90710 Acquired absence of both cervix and uterus: Secondary | ICD-10-CM

## 2017-07-23 DIAGNOSIS — Z9889 Other specified postprocedural states: Secondary | ICD-10-CM

## 2017-07-23 MED ORDER — MAGNESIUM CITRATE PO SOLN: 1.0000 | Freq: Once | ORAL | 0 refills | Status: AC

## 2017-07-23 MED ORDER — MAGNESIUM CITRATE PO SOLN: 0.5000 | Freq: Once | ORAL | 0 refills | Status: AC

## 2017-07-23 NOTE — Progress Notes (Signed)
    OBSTETRICS/GYNECOLOGY POST-OPERATIVE CLINIC VISIT  Subjective:     Sharon Marquez is a 45 y.o. female who presents to the clinic 1 weeks status post laparoscopic assisted vaginal hysterectomy with bilateral salpingectomy for abnormal uterine bleeding, fibroids and h/o anemia requiring blood transfusion. Eating a regular diet without difficulty. Bowel movements are abnormal with constipation despite use of stool softeners and Miralax.. Pain is controlled with current analgesics. Medications being used: prescription NSAID's including ibuprofen (Motrin).  She also uses ES Tylenol prn and will occasionally take a Percocet if needed.      Review of Systems Pertinent items noted in HPI and remainder of comprehensive ROS otherwise negative.    Objective:    BP 123/81 (BP Location: Left Arm, Patient Position: Sitting)   Pulse 77   Ht 5\' 3"  (1.6 m)   Wt 237 lb 8 oz (107.7 kg)   LMP 07/07/2017   BMI 42.07 kg/m  General:  alert and no distress  Abdomen: soft, bowel sounds active, non-tender  Incisions:   laparoscopic port sites healing well, no drainage, no erythema, no hernia, no seroma, no swelling, no dehiscence, incision well approximated    Pathology:  DIAGNOSIS:  A. UTERUS WITH CERVIX AND BILATERAL FALLOPIAN TUBES; TOTAL HYSTERECTOMY  WITH BILATERAL SALPINGECTOMY:  - CHRONIC CERVICITIS.  - WEAKLY PROLIFERATIVE ENDOMETRIUM.  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  - TWO FALLOPIAN TUBES WITH PARATUBAL CYSTS AND FIMBRIA.  - NEGATIVE FOR ATYPIA AND MALIGNANCY.   Assessment:    S/p LAVH with bilateral salpingectomy  Constipation Plan:   1. Continue any current medications.  Encouraged use of magnesium citrate for constipation as it was not completely relieved by colace or Miralax.  2. Wound care discussed. 3. Operative findings again reviewed. Pathology report discussed. 4. Activity restrictions: no bending, stooping, or squatting, no lifting more than 10-15  pounds and pelvic rest x 5 weeks.  5. Anticipated return to work: 5 weeks. 6. Follow up: 4 weeks for final post-operative check.    Rubie Maid, MD [facility]

## 2017-07-28 NOTE — Progress Notes (Signed)
[place]  [phone] [phone]) [phone]  ID: Sharon Marquez OB: [birth year]  MR#: [id number]  GYI#:[id number]  Patient Care Team: Patient, No Pcp Per as PCP - General (General Practice)  CHIEF COMPLAINT: Iron deficiency anemia, B-12 deficiency.  INTERVAL HISTORY: Patient returns to clinic today for repeat laboratory work and further evaluation. She currently feels well and is asymptomatic. She does not complain of weakness or fatigue today.  She recently hysterectomy without significant problems.  She has no neurologic complaints. She denies any recent fevers or illnesses. She has a good appetite and denies weight loss. She has no chest pain or shortness of breath. She denies any nausea, vomiting, constipation, or diarrhea. She has no melena or hematochezia. She has no urinary complaints. Patient offers no specific complaints today.  REVIEW OF SYSTEMS:   Review of Systems  Constitutional: Negative for fever, malaise/fatigue and weight loss.  Respiratory: Negative.  Negative for cough and shortness of breath.   Cardiovascular: Negative.  Negative for chest pain and leg swelling.  Gastrointestinal: Negative.  Negative for abdominal pain, blood in stool and melena.  Genitourinary: Negative.  Negative for hematuria.  Musculoskeletal: Negative.   Skin: Negative.  Negative for rash.  Neurological: Negative for weakness.  Psychiatric/Behavioral: Negative.  The patient is not nervous/anxious.     As per HPI. Otherwise, a complete review of systems is negative.  PAST MEDICAL HISTORY: Past Medical History:  Diagnosis Date  . Anemia   . Blood transfusion without reported diagnosis   . Family history of adverse reaction to anesthesia    "mother has difficulty waking up after surgery ."  . Fibroid uterus 2014  . History of blood transfusion 2014, 2015   for symptomatic anemia  . Vitamin B deficiency     PAST SURGICAL HISTORY: Past Surgical History:    Procedure Laterality Date  . CHOLECYSTECTOMY    . LAPAROSCOPIC VAGINAL HYSTERECTOMY WITH SALPINGECTOMY Bilateral 07/15/2017   Procedure: LAPAROSCOPIC ASSISTED VAGINAL HYSTERECTOMY WITH BILATERAL SALPINGECTOMY;  Surgeon: Rubie Maid, MD;  Location: [facility] ORS;  Service: Gynecology;  Laterality: Bilateral;    FAMILY HISTORY: Family History  Problem Relation Age of Onset  . Hyperthyroidism Mother   . Diabetes Mother   . Hypertension Mother   . Hyperthyroidism Maternal Aunt   . Diabetes Maternal Grandmother     ADVANCED DIRECTIVES (Y/N):  N  HEALTH MAINTENANCE: Social History   Tobacco Use  . Smoking status: Never Smoker  . Smokeless tobacco: Never Used  Substance Use Topics  . Alcohol use: No  . Drug use: No     Colonoscopy:  PAP:  Bone density:  Lipid panel:  Allergies  Allergen Reactions  . Meloxicam Itching  . Tramadol Itching and Swelling    Current Outpatient Medications  Medication Sig Dispense Refill  . cyanocobalamin (,VITAMIN B-12,) 1000 MCG/ML injection Inject 1 mL (1,000 mcg total) into the muscle every 30 (thirty) days. (Patient not taking: Reported on 07/23/2017) 10 mL 1  . docusate sodium (COLACE) 100 MG capsule Take 1 capsule (100 mg total) 2 (two) times daily as needed by mouth. (Patient not taking: Reported on 07/23/2017) 30 capsule 2  . Fe Cbn-Fe Gluc-FA-B12-C-DSS (FERRALET 90 PO) Take 1 tablet 2 (two) times daily by mouth.    Marland Kitchen ibuprofen (ADVIL,MOTRIN) 800 MG tablet Take 1 tablet (800 mg total) every 8 (eight) hours as needed by mouth. 60 tablet 1  . mometasone (NASONEX) 50 MCG/ACT nasal spray Place 2 sprays into the nose daily. (Patient  not taking: Reported on 06/12/2017) 17 g 12  . norethindrone (MICRONOR,CAMILA,ERRIN) 0.35 MG tablet Take 1 tablet (0.35 mg total) by mouth daily. (Patient not taking: Reported on 07/23/2017) 1 Package 11  . oxyCODONE-acetaminophen (PERCOCET/ROXICET) 5-325 MG tablet Take 1-2 tablets every 6 (six) hours as needed by  mouth for severe pain (moderate to severe pain (when tolerating fluids)). 30 tablet 0  . Phendimetrazine Tartrate 105 MG CP24 Take 1 capsule (105 mg total) by mouth daily. (Patient not taking: Reported on 07/23/2017) 30 each 2  . simethicone (MYLICON) 80 MG chewable tablet Chew 1 tablet (80 mg total) 4 (four) times daily as needed by mouth for flatulence. 30 tablet 0  . Vitamin D, Ergocalciferol, (DRISDOL) 50000 units CAPS capsule Take 1 capsule (50,000 Units total) by mouth 2 (two) times a week. (Patient not taking: Reported on 07/23/2017) 24 capsule 3   No current facility-administered medications for this visit.     OBJECTIVE: Vitals:   07/30/17 1525  BP: 120/77  Resp: 18  Temp: 97.8 F (36.6 C)     Body mass index is 42.73 kg/m.    ECOG FS:0 - Asymptomatic  General: Well-developed, well-nourished, no acute distress. Eyes: Pink conjunctiva, anicteric sclera. Lungs: Clear to auscultation bilaterally. Heart: Regular rate and rhythm. No rubs, murmurs, or gallops. Abdomen: Soft, nontender, nondistended. No organomegaly noted, normoactive bowel sounds. Musculoskeletal: No edema, cyanosis, or clubbing. Neuro: Alert, answering all questions appropriately. Cranial nerves grossly intact. Skin: No rashes or petechiae noted. Psych: Normal affect.  LAB RESULTS:  Lab Results  Component Value Date   NA 138 07/10/2017   K 3.4 (L) 07/10/2017   CL 105 07/10/2017   CO2 26 07/10/2017   GLUCOSE 98 07/10/2017   BUN 13 07/10/2017   CREATININE 0.34 (L) 07/16/2017   CALCIUM 8.7 (L) 07/10/2017   PROT 7.3 10/16/2016   ALBUMIN 3.8 10/16/2016   AST 17 10/16/2016   ALT 24 10/16/2016   ALKPHOS 51 10/16/2016   BILITOT 0.3 10/16/2016   GFRNONAA >60 07/16/2017   GFRAA >60 07/16/2017    Lab Results  Component Value Date   WBC 4.3 07/30/2017   NEUTROABS 1.8 07/30/2017   HGB 10.6 (L) 07/30/2017   HCT 32.8 (L) 07/30/2017   MCV 78.9 (L) 07/30/2017   PLT 398 07/30/2017   Lab Results    Component Value Date   IRON 18 (L) 07/30/2017   TIBC 409 07/30/2017   IRONPCTSAT 4 (L) 07/30/2017   Lab Results  Component Value Date   FERRITIN 5 (L) 07/30/2017     STUDIES: No results found.  ASSESSMENT: Iron deficiency anemia, B-12 deficiency.  PLAN:    1. Iron deficiency anemia: Patient's hemoglobin and iron stores continue to be decreased, therefore we will proceed with one infusion of 510 mg IV Feraheme today.  Now that she has a hysterectomy, her heavy menses have subsided and she may no longer require as frequent infusions.  Return to clinic in 3 months with repeat laboratory work and further evaluation. 2. B-12 deficiency: Patient last received B-12 in October 2018.  Hold B12 today and repeat laboratory work in 3 months as above. 3.  Heavy menses: Patient had total hysterectomy in November.  Continue follow-up with OB/GYN as indicated.   Patient expressed understanding and was in agreement with this plan. She also understands that She can call clinic at any time with any questions, concerns, or complaints.    Lloyd Huger, MD   07/31/2017 1:45 PM

## 2017-07-30 ENCOUNTER — Inpatient Hospital Stay: Payer: 59 | Attending: Oncology | Admitting: Oncology

## 2017-07-30 ENCOUNTER — Inpatient Hospital Stay: Payer: 59

## 2017-07-30 ENCOUNTER — Inpatient Hospital Stay: Payer: 59 | Admitting: *Deleted

## 2017-07-30 VITALS — BP 120/77 | Temp 97.8°F | Resp 18 | Wt 241.2 lb

## 2017-07-30 VITALS — BP 120/73 | HR 71 | Resp 18

## 2017-07-30 DIAGNOSIS — Z90710 Acquired absence of both cervix and uterus: Secondary | ICD-10-CM | POA: Insufficient documentation

## 2017-07-30 DIAGNOSIS — D500 Iron deficiency anemia secondary to blood loss (chronic): Secondary | ICD-10-CM | POA: Diagnosis not present

## 2017-07-30 DIAGNOSIS — D649 Anemia, unspecified: Secondary | ICD-10-CM

## 2017-07-30 DIAGNOSIS — E538 Deficiency of other specified B group vitamins: Secondary | ICD-10-CM | POA: Diagnosis not present

## 2017-07-30 DIAGNOSIS — Z79899 Other long term (current) drug therapy: Secondary | ICD-10-CM | POA: Diagnosis not present

## 2017-07-30 DIAGNOSIS — N920 Excessive and frequent menstruation with regular cycle: Secondary | ICD-10-CM | POA: Diagnosis not present

## 2017-07-30 LAB — CBC WITH DIFFERENTIAL/PLATELET
BASOS ABS: 0.1 10*3/uL (ref 0–0.1)
BASOS PCT: 2 %
Eosinophils Absolute: 0 10*3/uL (ref 0–0.7)
Eosinophils Relative: 1 %
HEMATOCRIT: 32.8 % — AB (ref 35.0–47.0)
Hemoglobin: 10.6 g/dL — ABNORMAL LOW (ref 12.0–16.0)
LYMPHS PCT: 42 %
Lymphs Abs: 1.8 10*3/uL (ref 1.0–3.6)
MCH: 25.6 pg — ABNORMAL LOW (ref 26.0–34.0)
MCHC: 32.5 g/dL (ref 32.0–36.0)
MCV: 78.9 fL — AB (ref 80.0–100.0)
Monocytes Absolute: 0.5 10*3/uL (ref 0.2–0.9)
Monocytes Relative: 12 %
NEUTROS ABS: 1.8 10*3/uL (ref 1.4–6.5)
NEUTROS PCT: 43 %
Platelets: 398 10*3/uL (ref 150–440)
RBC: 4.15 MIL/uL (ref 3.80–5.20)
RDW: 14.7 % — AB (ref 11.5–14.5)
WBC: 4.3 10*3/uL (ref 3.6–11.0)

## 2017-07-30 LAB — IRON AND TIBC
Iron: 18 ug/dL — ABNORMAL LOW (ref 28–170)
SATURATION RATIOS: 4 % — AB (ref 10.4–31.8)
TIBC: 409 ug/dL (ref 250–450)
UIBC: 391 ug/dL

## 2017-07-30 LAB — FERRITIN: Ferritin: 5 ng/mL — ABNORMAL LOW (ref 11–307)

## 2017-07-30 MED ORDER — FERUMOXYTOL INJECTION 510 MG/17 ML
510.0000 mg | Freq: Once | INTRAVENOUS | Status: AC
  Administered 2017-07-30: 510 mg via INTRAVENOUS
  Filled 2017-07-30: qty 17

## 2017-07-30 MED ORDER — SODIUM CHLORIDE 0.9 % IV SOLN
Freq: Once | INTRAVENOUS | Status: AC
  Administered 2017-07-30: 15:00:00 via INTRAVENOUS
  Filled 2017-07-30: qty 1000

## 2017-08-05 ENCOUNTER — Ambulatory Visit: Payer: 59 | Admitting: Obstetrics and Gynecology

## 2017-08-08 ENCOUNTER — Telehealth: Payer: Self-pay | Admitting: *Deleted

## 2017-08-08 NOTE — Telephone Encounter (Signed)
Mailbox full unable to leave message.

## 2017-08-08 NOTE — Telephone Encounter (Signed)
Patient called and states she was outside cleaning her car off from the snow and since then she has felt a sharpe pain in her stomach / side. Patient also has some spotting. Patient had a Total hysterectomy done on 07/15/17. Patient states she is taking pain medicine but it is not working. Patient is requesting a call back. Her contact number is [phone]. Please advise. Thank you

## 2017-08-09 NOTE — Telephone Encounter (Signed)
Pt states she scraped snowed off her car yesterday and since then she is having a sore/sharp pain in her abd that comes and goes. Bm-wnl. No uti sx. Vb- lite since hysterectomy 4 weeks ago. Changing 2x a day. Taking ibup 800. Advised to add tylenol 1000 q 6h. If pain continues to contact office on Monday for appt. Go to ed if severe pain or bleeding like a period. Pt voices understanding.

## 2017-08-12 ENCOUNTER — Ambulatory Visit: Payer: 59 | Admitting: Obstetrics and Gynecology

## 2017-08-12 ENCOUNTER — Other Ambulatory Visit: Payer: Self-pay | Admitting: *Deleted

## 2017-08-12 MED ORDER — PHENTERMINE HCL 37.5 MG PO TABS: 37.5000 mg | ORAL_TABLET | Freq: Every day | ORAL | 2 refills | Status: DC

## 2017-08-28 ENCOUNTER — Ambulatory Visit (INDEPENDENT_AMBULATORY_CARE_PROVIDER_SITE_OTHER): Payer: 59 | Admitting: Obstetrics and Gynecology

## 2017-08-28 ENCOUNTER — Encounter: Payer: Self-pay | Admitting: Obstetrics and Gynecology

## 2017-08-28 VITALS — BP 125/84 | HR 70 | Ht 63.0 in | Wt 250.3 lb

## 2017-08-28 DIAGNOSIS — Z90710 Acquired absence of both cervix and uterus: Secondary | ICD-10-CM

## 2017-08-28 DIAGNOSIS — N939 Abnormal uterine and vaginal bleeding, unspecified: Secondary | ICD-10-CM

## 2017-08-28 DIAGNOSIS — Z4889 Encounter for other specified surgical aftercare: Secondary | ICD-10-CM

## 2017-08-28 DIAGNOSIS — Z862 Personal history of diseases of the blood and blood-forming organs and certain disorders involving the immune mechanism: Secondary | ICD-10-CM

## 2017-08-28 DIAGNOSIS — Z7689 Persons encountering health services in other specified circumstances: Secondary | ICD-10-CM

## 2017-08-28 MED ORDER — POLYETHYLENE GLYCOL 3350 17 G PO PACK: 17.0000 g | PACK | Freq: Every day | ORAL | 0 refills | Status: DC

## 2017-08-28 NOTE — Progress Notes (Signed)
Pt is here for a 5 week post op visit. States she is spotting.

## 2017-08-28 NOTE — Progress Notes (Signed)
    OBSTETRICS/GYNECOLOGY POST-OPERATIVE CLINIC VISIT  Subjective:     Sharon Marquez is a 47 y.o. G21P0101 female who presents to the clinic 6 weeks status post laparoscopic assisted vaginal hysterectomy with bilateral salpingectomy for abnormal uterine bleeding, fibroids and h/o anemia requiring blood transfusion. Eating a regular diet without difficulty. Bowel movements are abnormal with constipation despite use of stool softeners and Miralax.. Pain is controlled with current analgesics. Medications being used: prescription NSAID's including ibuprofen (Motrin).      Review of Systems A comprehensive review of systems was negative except for: Genitourinary: positive for vaginal spotting, light bleeding when wiping.  Also notes occasional twinges of pain in left lower abdomen.    Objective:    BP 125/84   Pulse 70   Ht 5\' 3"  (1.6 m)   Wt 250 lb 5 oz (113.5 kg)   LMP 07/07/2017   BMI 44.34 kg/m  General:  alert and no distress  Abdomen: soft, bowel sounds active, non-tender  Incisions:   laparoscopic port sites healing well, no drainage, no erythema, no hernia, no seroma, no swelling, no dehiscence, incision well approximated  Pelvis:  external genitalia normal, rectovaginal septum normal.  Vagina without discharge. Uterus and cervix surgically absent. Vaginal cuff with several areas of granulation tissue present. No suture material present.  Adnexae non-palpable, nontender bilaterally.     Pathology:  DIAGNOSIS:  A. UTERUS WITH CERVIX AND BILATERAL FALLOPIAN TUBES; TOTAL HYSTERECTOMY  WITH BILATERAL SALPINGECTOMY:  - CHRONIC CERVICITIS.  - WEAKLY PROLIFERATIVE ENDOMETRIUM.  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  - TWO FALLOPIAN TUBES WITH PARATUBAL CYSTS AND FIMBRIA.  - NEGATIVE FOR ATYPIA AND MALIGNANCY.    Labs:  Lab Results  Component Value Date   WBC 4.3 07/30/2017   HGB 10.6 (L) 07/30/2017   HCT 32.8 (L) 07/30/2017   MCV 78.9 (L) 07/30/2017   PLT  398 07/30/2017    Assessment:    S/p LAVH with bilateral salpingectomy Vaginal spotting  Plan:   1. Continue any current medications.   2. Wound care discussed.  Silver nitrate sticks used to treat granulation tissue of the vagina.  3. Pathology report again discussed. 4. H/o anemia, received blood transfusion post-surgery.  Patient had iron levels checked last month with Hematologist. Due for recheck in 3 months.  5. Activity restrictions: no bending, stooping, or squatting, no lifting more than 10-15 pounds and pelvic rest x 2 weeks.  6. Anticipated return to work: 1-2 weeks.  Wok notice given.  7. Patient desires referral to establish care with a PCP. Will place referral. 8. Follow up: 4 months for annual exam.      Rubie Maid, MD [facility]

## 2017-09-06 ENCOUNTER — Encounter: Payer: Self-pay | Admitting: Obstetrics and Gynecology

## 2017-09-06 ENCOUNTER — Ambulatory Visit (INDEPENDENT_AMBULATORY_CARE_PROVIDER_SITE_OTHER): Payer: 59 | Admitting: Obstetrics and Gynecology

## 2017-09-06 VITALS — BP 111/72 | HR 72 | Ht 63.0 in | Wt 256.7 lb

## 2017-09-06 DIAGNOSIS — Z90710 Acquired absence of both cervix and uterus: Secondary | ICD-10-CM

## 2017-09-06 DIAGNOSIS — R197 Diarrhea, unspecified: Secondary | ICD-10-CM

## 2017-09-06 NOTE — Patient Instructions (Signed)
Bloody Diarrhea Bloody diarrhea is frequent loose and watery bowel movements that contain blood. The blood can be hard to see (occult) or notice. Bloody diarrhea may be caused by medical conditions such as:  Ulcerative colitis.  Crohn disease.  Intestinal infection.  Viral gastroenteritis or bacterial gastroenteritis.  Finding out why there is blood is in your diarrhea is necessary so your health care provider can prescribe the right treatment for you. Follow the instructions from your health care provider about treating the cause of your bloody diarrhea. Any type of diarrhea can make you feel weak and dehydrated. Dehydration can make you tired and thirsty, cause you to have a dry mouth, and decrease how often you urinate. Follow these instructions at home: Follow instructions from your health care provider about how to care for yourself at home. Eating and drinking Follow these recommendations as told by your health care provider:  Take an oral rehydration solution (ORS). This is a drink that is sold at pharmacies and retail stores.  Drink clear fluids such as water, ice chips, diluted fruit juice, and low-calorie sports drinks.  Eat bland, easy-to-digest foods in small amounts as you are able. These foods include bananas, applesauce, rice, lean meats, toast, and crackers.  Avoid drinking fluids that contain a lot of sugar or caffeine, such as energy drinks, sports drinks, and soda.  Avoid alcohol.  Avoid spicy or fatty foods.  General instructions   Drink enough fluid to keep your urine clear or pale yellow.  Wash your hands often. If soap and water are not available, use hand sanitizer.  Make sure that all people in your household wash their hands well and often.  Take over-the-counter and prescription medicines only as told by your health care provider.  Rest at home while you recover.  Take a warm bath to relieve any burning or pain from frequent diarrhea  episodes.  Watch your condition for any changes.  Keep all follow-up visits as told by your health care provider. This is important. Contact a health care provider if:  You have a fever.  You have new symptoms.  Your diarrhea gets worse.  You cannot keep fluids down.  You have a headache.  You feel light-headed or dizzy.  You have muscle cramps. Get help right away if:  You have chest pain.  You feel extremely weak or you faint.  The blood in your diarrhea increases or turns a different color.  You vomit and the vomit is bloody or looks black.  You have persistent diarrhea.  You have severe pain, cramping, or bloating in your abdomen.  You have trouble breathing or you are breathing very quickly.  Your heart is beating very quickly.  Your skin feels cold or clammy.  You feel confused.  You have signs of dehydration, such as: ? Dark urine, very little urine, or no urine. ? Cracked lips. ? Dry mouth. ? Sunken eyes. ? Sleepiness. ? Weakness. This information is not intended to replace advice given to you by your health care provider. Make sure you discuss any questions you have with your health care provider. Document Released: 08/13/2005 Document Revised: 12/22/2015 Document Reviewed: 04/19/2015 Elsevier Interactive Patient Education  2018 [place].   Stool for Occult Blood Test Why am I having this test? Stool for occult blood, or fecal occult blood test (FOBT), is a test that is used to screen for gastrointestinal (GI) bleeding, which may be an indicator of colon cancer. This test can also detect small  amounts of blood in your stool (feces) from other causes, such as ulcers or hemorrhoids. This test is usually done as part of an annual routine examination after age 38. What kind of sample is taken? A sample of your stool is required for this test. Your health care provider may collect the sample with a swab of the rectum. Or, you may be instructed to  collect the sample in a container at home. If you are instructed to collect the sample, your health care provider will provide you with the instructions and the supplies that you will need to do that. How do I collect samples at home? A stool sample may need to be collected at home. When collecting a sample at home, make sure that you:  Use the sterile containers and other supplies that were given to you from the lab.  Do not mix urine, toilet paper, or water with your sample.  Label all slides and containers with your name and the date when you collected the sample.  Your health care provider or lab staff will give you one or more test "cards." You will collect a separate sample from three different stools, usually on different days that follow each other. Follow these steps for each sample: 1. Collect a stool sample into a clean container. 2. With an applicator stick, apply a thin smear of stool sample onto each filter paper square or window that is on the card. 3. Allow the filter paper to dry. After it is dry, the sample will be stable.  Usually, you will return all of the samples to your health care provider or lab at the same time. How do I prepare for this test?  Do not eat any red meat within three days before testing.  Follow your health care provider's instructions about eating and drinking prior to the test. Your health care provider may instruct you to avoid other foods or substances.  Ask your health care provider about taking or not taking your medicines prior to the test. You may be instructed to avoid certain medicines that are known to interfere with this test. How are the results reported? Your test results will be reported as either positive or negative. It is your responsibility to obtain your test results. Ask the lab or department performing the test when and how you will get your results. What do the results mean? A negative test result means that there is no occult  blood within the stool. A negative result is normal. A positive test result may mean that there is blood in the stool. Causes of blood in the stool include:  GI tumors.  Certain GI diseases.  GI trauma or recent surgery.  Hemorrhoids.  Talk with your health care provider to discuss your results, treatment options, and if necessary, the need for more tests. Talk with your health care provider if you have any questions about your results. Talk with your health care provider to discuss your results, treatment options, and if necessary, the need for more tests. Talk with your health care provider if you have any questions about your results. This information is not intended to replace advice given to you by your health care provider. Make sure you discuss any questions you have with your health care provider. Document Released: 09/07/2004 Document Revised: 04/11/2016 Document Reviewed: 01/08/2014 Elsevier Interactive Patient Education  [employer].

## 2017-09-06 NOTE — Progress Notes (Signed)
    GYNECOLOGY PROGRESS NOTE  Subjective:    Patient ID: Sharon Marquez, female    DOB: [birth year], 47 y.o.   MRN: [id number]  HPI  Patient is a 47 y.o. G79P0101 female who presents for complaints of bloody loose stool x 1 day.  She is currently ~ 7 weeks s/p LAVH with bilateral salpingectomy. Patient notes that yesterday she had dined out and ate a hamburger.  Notes that during the night she noticed that her stools were loose, and there was blood in the commode (dark red).  She was worried that she had torn something.  She does endorse some episodes of constipation over the past 2 weeks for which she was taking stool softeners, but states that this had essentially resolved prior to this episode. Denies abdominal pain, nausea/vomiting chills, fevers. Denies h/o colon polyps, gastritis, or gastric ulcers. Denies urinary symptoms.   Of note, patient was still having some light vaginal bleeding last visit (~ 1-2 weeks ago at final post-op check), due to granulation tissue and small amount of retained suture material.  Is unsure if blood was from vaginal region this time or rectal, but thinks that it was rectal due to having a BM.    The following portions of the patient's history were reviewed and updated as appropriate: allergies, current medications, past family history, past medical history, past social history, past surgical history and problem list.  Review of Systems Pertinent items noted in HPI and remainder of comprehensive ROS otherwise negative.   Objective:   Blood pressure 111/72, pulse 72, height 5\' 3"  (1.6 m), weight 256 lb 11.2 oz (116.4 kg), last menstrual period 07/07/2017. General appearance: alert and no distress Abdomen: soft, non-tender; bowel sounds normal; no masses,  no organomegaly Pelvic: external genitalia normal, rectovaginal septum normal.  Vagina without discharge. Well healed. No suture material present. No blood in vaginal vault. Uterus and cervix surgically absent.  Bimanual exam no performed.  Rectal: No visible or palpable hemorrhoids. Good rectal tone.  No blood on glove, no masses palpable.  Skin: No rashes or lesions present. Skin turgor normal.   Assessment:   Bloody diarrhea S/p LAVH  Plan:  - Bloody diarrhea - unknown cause, but may be secondary to recent consumption of beef, corresponding to timeline of onset of loose stools. Patient notes that she has no longer been constipated and denies any recent straining during BMs.  Also denies hemorrhoids, with negatative exam today (both rectally and vaginally). As patient is afebrile and otherwise asymptomatic, advised on conservative management for diarrhea, and if no resolution in 1-2 days, also given stool card to give stool sample, as patient also has never had a Colonoscopy.  - Overall doing well post-surgically, vaginal cuff well healed.  - Return if symptoms worsen or do not improve.     Rubie Maid, MD [facility]

## 2017-09-09 ENCOUNTER — Encounter: Payer: Self-pay | Admitting: Obstetrics and Gynecology

## 2017-09-09 ENCOUNTER — Ambulatory Visit (INDEPENDENT_AMBULATORY_CARE_PROVIDER_SITE_OTHER): Payer: 59 | Admitting: Obstetrics and Gynecology

## 2017-09-09 VITALS — BP 114/73 | HR 84 | Wt 256.0 lb

## 2017-09-09 DIAGNOSIS — E663 Overweight: Secondary | ICD-10-CM

## 2017-09-09 MED ORDER — CYANOCOBALAMIN 1000 MCG/ML IJ SOLN
1000.0000 ug | Freq: Once | INTRAMUSCULAR | Status: AC
Start: 2017-09-09 — End: 2017-09-09
  Administered 2017-09-09: 1000 ug via INTRAMUSCULAR

## 2017-09-09 NOTE — Progress Notes (Signed)
Pt is here for wt, bp check, b-12 inj She is doing well, just started back to work and taking her phentermine  09/09/17 wt- 256.0lb 09/07/16 wt- 233lb

## 2017-09-10 ENCOUNTER — Other Ambulatory Visit: Payer: Self-pay | Admitting: Obstetrics and Gynecology

## 2017-09-17 ENCOUNTER — Other Ambulatory Visit: Payer: Self-pay

## 2017-09-17 MED ORDER — IBUPROFEN 800 MG PO TABS: 800.0000 mg | ORAL_TABLET | Freq: Three times a day (TID) | ORAL | 0 refills | Status: DC | PRN

## 2017-10-01 ENCOUNTER — Other Ambulatory Visit: Payer: Self-pay

## 2017-10-07 ENCOUNTER — Encounter: Payer: Self-pay | Admitting: Obstetrics and Gynecology

## 2017-10-07 ENCOUNTER — Ambulatory Visit (INDEPENDENT_AMBULATORY_CARE_PROVIDER_SITE_OTHER): Payer: 59 | Admitting: Certified Nurse Midwife

## 2017-10-07 VITALS — BP 119/76 | HR 72 | Wt 249.9 lb

## 2017-10-07 DIAGNOSIS — E663 Overweight: Secondary | ICD-10-CM

## 2017-10-07 MED ORDER — CYANOCOBALAMIN 1000 MCG/ML IJ SOLN
1000.0000 ug | Freq: Once | INTRAMUSCULAR | Status: AC
  Administered 2017-10-07: 1000 ug via INTRAMUSCULAR

## 2017-10-07 NOTE — Progress Notes (Signed)
Pt is here for wt, bp check, b-12 inj She is doing well, back to work and working a lot of hours  10/07/17 wt- 249.9lb 09/09/17 wt- 256lb

## 2017-10-08 NOTE — Progress Notes (Signed)
I have reviewed the record and concur with patient management and plan.    Jenkins Michelle Lawhorn, CNM [facility], [facility] 

## 2017-10-25 ENCOUNTER — Other Ambulatory Visit: Payer: Self-pay | Admitting: *Deleted

## 2017-10-25 DIAGNOSIS — D509 Iron deficiency anemia, unspecified: Secondary | ICD-10-CM

## 2017-10-28 ENCOUNTER — Encounter: Payer: Self-pay | Admitting: *Deleted

## 2017-10-28 ENCOUNTER — Inpatient Hospital Stay: Payer: 59 | Attending: Oncology

## 2017-10-28 DIAGNOSIS — Z79899 Other long term (current) drug therapy: Secondary | ICD-10-CM | POA: Insufficient documentation

## 2017-10-28 DIAGNOSIS — E538 Deficiency of other specified B group vitamins: Secondary | ICD-10-CM | POA: Diagnosis not present

## 2017-10-28 DIAGNOSIS — D509 Iron deficiency anemia, unspecified: Secondary | ICD-10-CM | POA: Insufficient documentation

## 2017-10-28 DIAGNOSIS — Z90710 Acquired absence of both cervix and uterus: Secondary | ICD-10-CM | POA: Diagnosis not present

## 2017-10-28 LAB — CBC WITH DIFFERENTIAL/PLATELET
BASOS ABS: 0.1 10*3/uL (ref 0–0.1)
Basophils Relative: 1 %
Eosinophils Absolute: 0 10*3/uL (ref 0–0.7)
Eosinophils Relative: 0 %
HEMATOCRIT: 40.1 % (ref 35.0–47.0)
HEMOGLOBIN: 13.2 g/dL (ref 12.0–16.0)
Lymphocytes Relative: 44 %
Lymphs Abs: 1.7 10*3/uL (ref 1.0–3.6)
MCH: 26.2 pg (ref 26.0–34.0)
MCHC: 33 g/dL (ref 32.0–36.0)
MCV: 79.6 fL — AB (ref 80.0–100.0)
MONO ABS: 0.5 10*3/uL (ref 0.2–0.9)
Monocytes Relative: 12 %
NEUTROS ABS: 1.7 10*3/uL (ref 1.4–6.5)
NEUTROS PCT: 43 %
Platelets: 276 10*3/uL (ref 150–440)
RBC: 5.04 MIL/uL (ref 3.80–5.20)
RDW: 16.3 % — AB (ref 11.5–14.5)
WBC: 4 10*3/uL (ref 3.6–11.0)

## 2017-10-28 LAB — IRON AND TIBC
IRON: 44 ug/dL (ref 28–170)
Saturation Ratios: 11 % (ref 10.4–31.8)
TIBC: 393 ug/dL (ref 250–450)
UIBC: 349 ug/dL

## 2017-10-28 LAB — FERRITIN: FERRITIN: 9 ng/mL — AB (ref 11–307)

## 2017-11-03 NOTE — Progress Notes (Signed)
[place]  [phone] [phone]  ID: Sharon Marquez OB: 1971-07-16  MR#: [id number]  XTK#:[id number]  Patient Care Team: Patient, No Pcp Per as PCP - General (General Practice)  CHIEF COMPLAINT: Iron deficiency anemia, B-12 deficiency.  INTERVAL HISTORY: Patient returns to clinic today for repeat laboratory work and further evaluation. She currently feels well and is asymptomatic. She does not complain of weakness or fatigue today.  She recently hysterectomy without significant problems.  She has no neurologic complaints. She denies any recent fevers or illnesses. She has a good appetite and denies weight loss. She has no chest pain or shortness of breath. She denies any nausea, vomiting, constipation, or diarrhea. She has no melena or hematochezia. She has no urinary complaints. Patient offers no specific complaints today.  REVIEW OF SYSTEMS:   Review of Systems  Constitutional: Negative for fever, malaise/fatigue and weight loss.  Respiratory: Negative.  Negative for cough and shortness of breath.   Cardiovascular: Negative.  Negative for chest pain and leg swelling.  Gastrointestinal: Negative.  Negative for abdominal pain, blood in stool and melena.  Genitourinary: Negative.  Negative for hematuria.  Musculoskeletal: Negative.   Skin: Negative.  Negative for rash.  Neurological: Negative for weakness.  Psychiatric/Behavioral: Negative.  The patient is not nervous/anxious.     As per HPI. Otherwise, a complete review of systems is negative.  PAST MEDICAL HISTORY: Past Medical History:  Diagnosis Date  . Anemia   . Blood transfusion without reported diagnosis   . Family history of adverse reaction to anesthesia    "mother has difficulty waking up after surgery ."  . Fibroid uterus 2014  . History of blood transfusion 2014, 2015   for symptomatic anemia  . Vitamin B deficiency     PAST SURGICAL HISTORY: Past Surgical History:    Procedure Laterality Date  . ABDOMINAL HYSTERECTOMY    . CHOLECYSTECTOMY    . LAPAROSCOPIC VAGINAL HYSTERECTOMY WITH SALPINGECTOMY Bilateral 07/15/2017   Procedure: LAPAROSCOPIC ASSISTED VAGINAL HYSTERECTOMY WITH BILATERAL SALPINGECTOMY;  Surgeon: Rubie Maid, MD;  Location: [facility] ORS;  Service: Gynecology;  Laterality: Bilateral;    FAMILY HISTORY: Family History  Problem Relation Age of Onset  . Hyperthyroidism Mother   . Diabetes Mother   . Hypertension Mother   . Hyperthyroidism Maternal Aunt   . Diabetes Maternal Grandmother     ADVANCED DIRECTIVES (Y/N):  N  HEALTH MAINTENANCE: Social History   Tobacco Use  . Smoking status: Never Smoker  . Smokeless tobacco: Never Used  Substance Use Topics  . Alcohol use: No  . Drug use: No     Colonoscopy:  PAP:  Bone density:  Lipid panel:  Allergies  Allergen Reactions  . Meloxicam Itching  . Tramadol Itching and Swelling    Current Outpatient Medications  Medication Sig Dispense Refill  . cyanocobalamin (,VITAMIN B-12,) 1000 MCG/ML injection Inject 1 mL (1,000 mcg total) into the muscle every 30 (thirty) days. 10 mL 1  . phentermine (ADIPEX-P) 37.5 MG tablet Take 1 tablet (37.5 mg total) by mouth daily before breakfast. 30 tablet 2  . Vitamin D, Ergocalciferol, (DRISDOL) 50000 units CAPS capsule Take 1 capsule (50,000 Units total) by mouth 2 (two) times a week. 24 capsule 3  . ibuprofen (ADVIL,MOTRIN) 800 MG tablet Take 1 tablet (800 mg total) by mouth every 8 (eight) hours as needed. (Patient not taking: Reported on 11/04/2017) 180 tablet 0  . simethicone (MYLICON) 80 MG chewable tablet Chew 1 tablet (80 mg  total) 4 (four) times daily as needed by mouth for flatulence. (Patient not taking: Reported on 09/09/2017) 30 tablet 0   No current facility-administered medications for this visit.     OBJECTIVE: Vitals:   11/04/17 1339  BP: 133/84  Pulse: 83  Resp: 18  Temp: 98.2 F (36.8 C)     Body mass index is 43.98  kg/m.    ECOG FS:0 - Asymptomatic  General: Well-developed, well-nourished, no acute distress. Eyes: Pink conjunctiva, anicteric sclera. Lungs: Clear to auscultation bilaterally. Heart: Regular rate and rhythm. No rubs, murmurs, or gallops. Abdomen: Soft, nontender, nondistended. No organomegaly noted, normoactive bowel sounds. Musculoskeletal: No edema, cyanosis, or clubbing. Neuro: Alert, answering all questions appropriately. Cranial nerves grossly intact. Skin: No rashes or petechiae noted. Psych: Normal affect.  LAB RESULTS:  Lab Results  Component Value Date   NA 138 07/10/2017   K 3.4 (L) 07/10/2017   CL 105 07/10/2017   CO2 26 07/10/2017   GLUCOSE 98 07/10/2017   BUN 13 07/10/2017   CREATININE 0.34 (L) 07/16/2017   CALCIUM 8.7 (L) 07/10/2017   PROT 7.3 10/16/2016   ALBUMIN 3.8 10/16/2016   AST 17 10/16/2016   ALT 24 10/16/2016   ALKPHOS 51 10/16/2016   BILITOT 0.3 10/16/2016   GFRNONAA >60 07/16/2017   GFRAA >60 07/16/2017    Lab Results  Component Value Date   WBC 4.0 10/28/2017   NEUTROABS 1.7 10/28/2017   HGB 13.2 10/28/2017   HCT 40.1 10/28/2017   MCV 79.6 (L) 10/28/2017   PLT 276 10/28/2017   Lab Results  Component Value Date   IRON 44 10/28/2017   TIBC 393 10/28/2017   IRONPCTSAT 11 10/28/2017   Lab Results  Component Value Date   FERRITIN 9 (L) 10/28/2017     STUDIES: No results found.  ASSESSMENT: Iron deficiency anemia, B-12 deficiency.  PLAN:    1. Iron deficiency anemia: Patient's hemoglobin has significantly improved and now was then within normal limits.  She continues to have reduced iron stores, therefore will proceed with one additional infusion of 510 mg IV Feraheme today.  Now that she has a hysterectomy, her heavy menses have subsided and she may no longer require as frequent infusions.  Return to clinic in 4 months with repeat laboratory work and further evaluation. 2. B-12 deficiency: Patient last received B-12 in October  2018.  Patient was given a B12 injection today.   3.  Heavy menses: Patient had total hysterectomy in November 2018.  Continue follow-up with OB/GYN as indicated.  Approximately 30 minutes was spent in discussion of which greater than 50% was consultation.  Patient expressed understanding and was in agreement with this plan. She also understands that She can call clinic at any time with any questions, concerns, or complaints.    Lloyd Huger, MD   11/08/2017 10:44 AM

## 2017-11-04 ENCOUNTER — Other Ambulatory Visit: Payer: Self-pay

## 2017-11-04 ENCOUNTER — Encounter: Payer: Self-pay | Admitting: *Deleted

## 2017-11-04 ENCOUNTER — Ambulatory Visit (INDEPENDENT_AMBULATORY_CARE_PROVIDER_SITE_OTHER): Payer: 59 | Admitting: Obstetrics and Gynecology

## 2017-11-04 ENCOUNTER — Encounter: Payer: Self-pay | Admitting: Obstetrics and Gynecology

## 2017-11-04 ENCOUNTER — Inpatient Hospital Stay: Payer: 59

## 2017-11-04 ENCOUNTER — Inpatient Hospital Stay (HOSPITAL_BASED_OUTPATIENT_CLINIC_OR_DEPARTMENT_OTHER): Payer: 59 | Admitting: Oncology

## 2017-11-04 VITALS — BP 114/76 | HR 73 | Temp 98.4°F | Resp 18

## 2017-11-04 VITALS — BP 125/81 | HR 79 | Wt 249.0 lb

## 2017-11-04 VITALS — BP 133/84 | HR 83 | Temp 98.2°F | Resp 18 | Wt 248.3 lb

## 2017-11-04 DIAGNOSIS — E663 Overweight: Secondary | ICD-10-CM | POA: Diagnosis not present

## 2017-11-04 DIAGNOSIS — D500 Iron deficiency anemia secondary to blood loss (chronic): Secondary | ICD-10-CM

## 2017-11-04 DIAGNOSIS — Z6841 Body Mass Index (BMI) 40.0 and over, adult: Secondary | ICD-10-CM | POA: Diagnosis not present

## 2017-11-04 DIAGNOSIS — D509 Iron deficiency anemia, unspecified: Secondary | ICD-10-CM

## 2017-11-04 DIAGNOSIS — E538 Deficiency of other specified B group vitamins: Secondary | ICD-10-CM

## 2017-11-04 DIAGNOSIS — Z79899 Other long term (current) drug therapy: Secondary | ICD-10-CM

## 2017-11-04 DIAGNOSIS — Z90710 Acquired absence of both cervix and uterus: Secondary | ICD-10-CM

## 2017-11-04 MED ORDER — SODIUM CHLORIDE 0.9 % IV SOLN
Freq: Once | INTRAVENOUS | Status: AC
  Administered 2017-11-04: 15:00:00 via INTRAVENOUS
  Filled 2017-11-04: qty 1000

## 2017-11-04 MED ORDER — CYANOCOBALAMIN 1000 MCG/ML IJ SOLN
1000.0000 ug | Freq: Once | INTRAMUSCULAR | Status: AC
  Administered 2017-11-04: 1000 ug via INTRAMUSCULAR

## 2017-11-04 MED ORDER — SODIUM CHLORIDE 0.9 % IV SOLN
510.0000 mg | Freq: Once | INTRAVENOUS | Status: AC
  Administered 2017-11-04: 510 mg via INTRAVENOUS
  Filled 2017-11-04: qty 17

## 2017-11-04 NOTE — Progress Notes (Signed)
Pt is here for wt, bp check, b-12 inj She is doing well, just started back on her phentermine  11/04/17 wt- 249lb 10/07/17 wt- 249lb

## 2017-11-04 NOTE — Progress Notes (Signed)
Here for follow up

## 2017-11-04 NOTE — Progress Notes (Signed)
Pt denies to stay 30 minute post observation d/t needing to make another MD appt. Pt tolerated infusion well. Pt and VS stable at discharge.

## 2017-11-06 NOTE — Progress Notes (Signed)
I have reviewed the record and concur with patient management and plan.  Amori Colomb, MD [facility]     

## 2017-12-05 ENCOUNTER — Other Ambulatory Visit: Payer: Self-pay | Admitting: Obstetrics and Gynecology

## 2017-12-05 ENCOUNTER — Encounter: Payer: 59 | Admitting: Obstetrics and Gynecology

## 2017-12-10 ENCOUNTER — Other Ambulatory Visit: Payer: Self-pay

## 2017-12-10 ENCOUNTER — Encounter: Payer: Self-pay | Admitting: Obstetrics and Gynecology

## 2017-12-10 ENCOUNTER — Ambulatory Visit: Payer: 59 | Admitting: Obstetrics and Gynecology

## 2017-12-10 DIAGNOSIS — E559 Vitamin D deficiency, unspecified: Secondary | ICD-10-CM

## 2017-12-10 NOTE — Progress Notes (Unsigned)
Pt is doing well incisions are healing well. No other concerns.

## 2017-12-18 ENCOUNTER — Encounter: Payer: 59 | Admitting: Obstetrics and Gynecology

## 2017-12-23 ENCOUNTER — Other Ambulatory Visit: Payer: Self-pay

## 2018-01-01 ENCOUNTER — Ambulatory Visit (INDEPENDENT_AMBULATORY_CARE_PROVIDER_SITE_OTHER): Payer: 59 | Admitting: Obstetrics and Gynecology

## 2018-01-01 ENCOUNTER — Encounter (INDEPENDENT_AMBULATORY_CARE_PROVIDER_SITE_OTHER): Payer: Self-pay

## 2018-01-01 ENCOUNTER — Encounter: Payer: Self-pay | Admitting: Obstetrics and Gynecology

## 2018-01-01 ENCOUNTER — Other Ambulatory Visit: Payer: Self-pay

## 2018-01-01 VITALS — BP 120/74 | HR 73 | Ht 62.5 in | Wt 252.9 lb

## 2018-01-01 DIAGNOSIS — N760 Acute vaginitis: Secondary | ICD-10-CM | POA: Diagnosis not present

## 2018-01-01 DIAGNOSIS — L7682 Other postprocedural complications of skin and subcutaneous tissue: Secondary | ICD-10-CM | POA: Diagnosis not present

## 2018-01-01 DIAGNOSIS — N898 Other specified noninflammatory disorders of vagina: Secondary | ICD-10-CM | POA: Diagnosis not present

## 2018-01-01 DIAGNOSIS — E559 Vitamin D deficiency, unspecified: Secondary | ICD-10-CM

## 2018-01-01 MED ORDER — CETIRIZINE HCL 10 MG PO CAPS: 1.0000 | ORAL_CAPSULE | Freq: Every day | ORAL | 11 refills | Status: DC

## 2018-01-01 MED ORDER — FLUCONAZOLE 150 MG PO TABS: 150.0000 mg | ORAL_TABLET | Freq: Once | ORAL | 3 refills | Status: AC

## 2018-01-01 MED ORDER — METRONIDAZOLE 500 MG PO TABS: 500.0000 mg | ORAL_TABLET | Freq: Two times a day (BID) | ORAL | 0 refills | Status: DC

## 2018-01-01 MED ORDER — LIDOCAINE HCL URETHRAL/MUCOSAL 2 % EX GEL: 1.0000 "application " | CUTANEOUS | 2 refills | Status: DC | PRN

## 2018-01-01 MED ORDER — VITAMIN D (ERGOCALCIFEROL) 1.25 MG (50000 UNIT) PO CAPS: 50000.0000 [IU] | ORAL_CAPSULE | ORAL | 3 refills | Status: DC

## 2018-01-01 NOTE — Patient Instructions (Signed)

## 2018-01-01 NOTE — Progress Notes (Signed)
    GYNECOLOGY PROGRESS NOTE  Subjective:    Patient ID: Sharon Marquez, female    DOB: 1970-09-17, 47 y.o.   MRN: [id number]  HPI  Patient is a 47 y.o. G43P0101 female who presents for complaints of vaginal discharge x 2 days.  Notes that it is white and thick. Denies odor, but does report some occasional itching.    In addition, patient reports that her prior laparoscopic incisions are still occasionally sore.  Patient had an LAVH in November. States that she can press on her incisions which does not cause any pain, but feels discomfort or irritation if her clothes touch the incision for too long.    The following portions of the patient's history were reviewed and updated as appropriate: allergies, current medications, past family history, past medical history, past social history, past surgical history and problem list.  Review of Systems Pertinent items noted in HPI and remainder of comprehensive ROS otherwise negative.   Objective:   Blood pressure 120/74, pulse 73, height 5' 2.5" (1.588 m), weight 252 lb 14.4 oz (114.7 kg), last menstrual period 07/07/2017. General appearance: alert and no distress Abdomen: soft, non-tender; bowel sounds normal; no masses,  no organomegaly. Laparoscopic ncision scars well healed, faintly visible.  Pelvic: external genitalia normal, rectovaginal septum normal.  Vagina with small amount of thick white discharge, no odor.  Vaginal cuff appears normal, however small area at apex friable with speculum exam, still with granulation tissue noted.    Microscopic wet-mount exam shows clue cells, hyphae, with few budding yeast.  No trichomonads or WBCs.   Assessment:   Vaginitis (BV and yeast infection) Incision pain Granulation tissue  Plan:   - Vaginitis - prescription given for Flagyl, and after completion of course, to take 1 tab of Diflucan.  - Incision pain - after further discussion patient does report leaning on abdomen against a counter most of  the day at work, with most of the pressure applied at the edges of her abdomen near incision sites. Advised on imrproving posture while standing at work. Also will prescribe lidocaine gel to place over incisions, or can place OTC lidocaine patches (cut in small squares) over the incisions if the gel is not covered by insurance. Also discussed the nerve regeneration process after surgery and that right now she may be a little hypersensitive to certain types of stimulation, but usually will resolve with time.  - Granulation tissue in vagina treated with silver nitrate sticks.  Advised on no intercourse for several days.  - Patient can f/u as needed.  Will need an annual exam in the next few months.    Rubie Maid, MD [facility]

## 2018-01-01 NOTE — Progress Notes (Signed)
Pt stated that she has some sore tender spots where the incision are.

## 2018-01-02 ENCOUNTER — Encounter: Payer: Self-pay | Admitting: Obstetrics and Gynecology

## 2018-01-08 ENCOUNTER — Encounter: Payer: Self-pay | Admitting: Obstetrics and Gynecology

## 2018-01-08 ENCOUNTER — Ambulatory Visit (INDEPENDENT_AMBULATORY_CARE_PROVIDER_SITE_OTHER): Payer: 59 | Admitting: Obstetrics and Gynecology

## 2018-01-08 DIAGNOSIS — E559 Vitamin D deficiency, unspecified: Secondary | ICD-10-CM | POA: Diagnosis not present

## 2018-01-08 MED ORDER — PHENTERMINE HCL 37.5 MG PO TABS: 37.5000 mg | ORAL_TABLET | Freq: Every day | ORAL | 2 refills | Status: DC

## 2018-01-08 MED ORDER — VITAMIN D (ERGOCALCIFEROL) 1.25 MG (50000 UNIT) PO CAPS: 50000.0000 [IU] | ORAL_CAPSULE | ORAL | 3 refills | Status: DC

## 2018-01-08 NOTE — Progress Notes (Signed)
SUBJECTIVE:  47 y.o. here for follow-up weight loss visit, previously seen 12 weeks ago. Denies any concerns and desires restart weight loss medication. Was out for hysterectomy. Plans to start exercising now that she is cleared surgically.   OBJECTIVE:  BP 109/65   Pulse 84   Ht 5\' 3"  (1.6 m)   Wt 252 lb 1.6 oz (114.4 kg)   LMP 07/07/2017   BMI 44.66 kg/m   Body mass index is 44.66 kg/m. Patient appears well. ASSESSMENT:  Obesity  PLAN:  To restart weight loss medications.will increased adipex to 2 tablets daily as needed since she gets up at 4am and it wears off by 2 pm.  B12 1073mcg/ml injection given RTC in 4 weeks as planned  Melody [place], CNM

## 2018-02-17 ENCOUNTER — Ambulatory Visit (INDEPENDENT_AMBULATORY_CARE_PROVIDER_SITE_OTHER): Payer: 59 | Admitting: Certified Nurse Midwife

## 2018-02-17 ENCOUNTER — Encounter: Payer: Self-pay | Admitting: Obstetrics and Gynecology

## 2018-02-17 MED ORDER — CYANOCOBALAMIN 1000 MCG/ML IJ SOLN
1000.0000 ug | Freq: Once | INTRAMUSCULAR | Status: AC
  Administered 2018-02-17: 1000 ug via INTRAMUSCULAR

## 2018-02-17 NOTE — Progress Notes (Signed)
Pt is here for wt, bp check, b-12 inj She is doing well, denies any s/e  02/17/18 wt- 248.2lb 01/08/18 wt- 252lb

## 2018-02-28 ENCOUNTER — Emergency Department: Payer: 59

## 2018-02-28 ENCOUNTER — Emergency Department
Admission: EM | Admit: 2018-02-28 | Discharge: 2018-02-28 | Disposition: A | Discharge status: Home / Self Care | Payer: 59 | Attending: Emergency Medicine | Admitting: Emergency Medicine

## 2018-02-28 ENCOUNTER — Other Ambulatory Visit: Payer: Self-pay

## 2018-02-28 ENCOUNTER — Encounter: Payer: Self-pay | Admitting: Emergency Medicine

## 2018-02-28 DIAGNOSIS — Y9389 Activity, other specified: Secondary | ICD-10-CM | POA: Insufficient documentation

## 2018-02-28 DIAGNOSIS — Z79899 Other long term (current) drug therapy: Secondary | ICD-10-CM | POA: Diagnosis not present

## 2018-02-28 DIAGNOSIS — M779 Enthesopathy, unspecified: Secondary | ICD-10-CM

## 2018-02-28 DIAGNOSIS — M70842 Other soft tissue disorders related to use, overuse and pressure, left hand: Secondary | ICD-10-CM | POA: Diagnosis not present

## 2018-02-28 DIAGNOSIS — M25532 Pain in left wrist: Secondary | ICD-10-CM

## 2018-02-28 MED ORDER — HYDROCODONE-ACETAMINOPHEN 5-325 MG PO TABS: 1.0000 | ORAL_TABLET | Freq: Four times a day (QID) | ORAL | 0 refills | Status: AC | PRN

## 2018-02-28 NOTE — ED Triage Notes (Signed)
Left wrist pain with swelling.  No injury but she lifts a lot at work.

## 2018-02-28 NOTE — ED Provider Notes (Signed)
[facility] Emergency Department Provider Note  ____________________________________________  Time seen: Approximately 1:20 PM  I have reviewed the triage vital signs and the nursing notes.   HISTORY  Chief Complaint Wrist Pain    HPI Sharon Marquez is a 47 y.o. female that presents to the emergency department for evaluation of left wrist pain for 5 days.  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  She states that the person she works with has recently been over packing the boxes and they have been heavier than usual.   No injury.  No numbness, tingling.   Past Medical History:  Diagnosis Date  . Anemia   . Blood transfusion without reported diagnosis   . Family history of adverse reaction to anesthesia    "mother has difficulty waking up after surgery ."  . Fibroid uterus 2014  . History of blood transfusion 2014, 2015   for symptomatic anemia  . Vitamin B deficiency     Patient Active Problem List   Diagnosis Date Noted  . S/P laparoscopic assisted vaginal hysterectomy (LAVH) 07/15/2017  . Vitamin D deficiency 02/19/2017  . B12 deficiency 10/22/2016  . Morbid obesity ([place]) 08/05/2016  . Abnormal uterine bleeding 08/05/2016  . History of blood transfusion 08/02/2016  . Iron deficiency anemia 07/11/2016  . Fibroid uterus 08/27/2012    Past Surgical History:  Procedure Laterality Date  . ABDOMINAL HYSTERECTOMY    . CHOLECYSTECTOMY    . LAPAROSCOPIC VAGINAL HYSTERECTOMY WITH SALPINGECTOMY Bilateral 07/15/2017   Procedure: LAPAROSCOPIC ASSISTED VAGINAL HYSTERECTOMY WITH BILATERAL SALPINGECTOMY;  Surgeon: Rubie Maid, MD;  Location: [facility] ORS;  Service: Gynecology;  Laterality: Bilateral;    Prior to Admission medications   Medication Sig Start Date End Date Taking? Authorizing Provider  Cetirizine HCl (ZYRTEC ALLERGY) 10 MG CAPS Take 1 capsule (10 mg total) by mouth daily. 01/01/18   Rubie Maid, MD   cyanocobalamin (,VITAMIN B-12,) 1000 MCG/ML injection Inject 1 mL (1,000 mcg total) into the muscle every 30 (thirty) days. 05/16/17   Shambley, Melody N, CNM  HYDROcodone-acetaminophen (NORCO/VICODIN) 5-325 MG tablet Take 1 tablet by mouth every 6 (six) hours as needed for up to 3 days for moderate pain. 02/28/18 03/03/18  Laban Emperor, PA-C  ibuprofen (ADVIL,MOTRIN) 800 MG tablet TAKE 1 TABLET BY MOUTH EVERY 8 HOURS AS NEEDED 12/09/17   Rubie Maid, MD  lidocaine (XYLOCAINE) 2 % jelly Apply 1 application topically as needed. Patient not taking: Reported on 02/17/2018 01/01/18   Rubie Maid, MD  metroNIDAZOLE (FLAGYL) 500 MG tablet Take 1 tablet (500 mg total) by mouth 2 (two) times daily. Patient not taking: Reported on 02/17/2018 01/01/18   Rubie Maid, MD  phentermine (ADIPEX-P) 37.5 MG tablet Take 1 tablet (37.5 mg total) by mouth daily before breakfast. 01/08/18   Shambley, Melody N, CNM  Vitamin D, Ergocalciferol, (DRISDOL) 50000 units CAPS capsule Take 1 capsule (50,000 Units total) by mouth 2 (two) times a week. 01/09/18   Shambley, Melody N, CNM    Allergies Meloxicam and Tramadol  Family History  Problem Relation Age of Onset  . Hyperthyroidism Mother   . Diabetes Mother   . Hypertension Mother   . Hyperthyroidism Maternal Aunt   . Diabetes Maternal Grandmother     Social History Social History   Tobacco Use  . Smoking status: Never Smoker  . Smokeless tobacco: Never Used  Substance Use Topics  . Alcohol use: No  . Drug use: No  Review of Systems  Cardiovascular: No chest pain. Respiratory: No SOB. Gastrointestinal: No abdominal pain.  No nausea, no vomiting.  Musculoskeletal: Positive for wrist pain.  Skin: Negative for rash, abrasions, lacerations, ecchymosis. Neurological: Negative for headaches, numbness or tingling   ____________________________________________   PHYSICAL EXAM:  VITAL SIGNS: ED Triage Vitals  Enc Vitals Group     BP 02/28/18 1214  128/83     Pulse Rate 02/28/18 1214 60     Resp 02/28/18 1214 16     Temp 02/28/18 1214 98.4 F (36.9 C)     Temp src --      SpO2 02/28/18 1214 100 %     Weight 02/28/18 1215 248 lb (112.5 kg)     Height 02/28/18 1215 5\' 3"  (1.6 m)     Head Circumference --      Peak Flow --      Pain Score 02/28/18 1214 10     Pain Loc --      Pain Edu? --      Excl. in [place]? --      Constitutional: Alert and oriented. Well appearing and in no acute distress. Eyes: Conjunctivae are normal. PERRL. EOMI. Head: Atraumatic. ENT:      Ears:      Nose: No congestion/rhinnorhea.      Mouth/Throat: Mucous membranes are moist.  Neck: No stridor.  Cardiovascular: Good peripheral circulation. Symmetric radial pulses bilaterally. Respiratory: Normal respiratory effort without tachypnea or retractions.   Musculoskeletal: Full range of motion to all extremities. No gross deformities appreciated. Moderate diffuse tenderness to palpation over dorsal right wrist. Full ROM of wrist. Grip strength intact.  Neurologic:  Normal speech and language. No gross focal neurologic deficits are appreciated.  Skin:  Skin is warm, dry and intact. No rash noted. Psychiatric: Mood and affect are normal. Speech and behavior are normal. Patient exhibits appropriate insight and judgement.   ____________________________________________   LABS (all labs ordered are listed, but only abnormal results are displayed)  Labs Reviewed - No data to display ____________________________________________  EKG   ____________________________________________  RADIOLOGY  Dg Wrist Complete Left  Result Date: 02/28/2018 CLINICAL DATA:  Pain after repetitive lifting EXAM: LEFT WRIST - COMPLETE 3+ VIEW COMPARISON:  None. FINDINGS: Frontal, oblique, lateral, and ulnar deviation scaphoid images were obtained. No fracture or dislocation. 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 No appreciable joint  space narrowing or erosion. No intra-articular calcification. 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 No fracture or dislocation. No appreciable arthropathy. Electronically Signed   By: Lowella Grip III M.D.   On: 02/28/2018 13:27    ____________________________________________    PROCEDURES  Procedure(s) performed:    Procedures    Medications - No data to display   ____________________________________________   INITIAL IMPRESSION / ASSESSMENT AND PLAN / ED COURSE  Pertinent labs & imaging results that were available during my care of the patient were reviewed by me and considered in my medical decision making (see chart for details).  Review of the [place] CSRS was performed in accordance of the [place] prior to dispensing any controlled drugs.    Patient's diagnosis is consistent with tendinitis.  Vital signs and exam are reassuring.  X-ray negative for acute bony abnormalities.  Findings were discussed with patient.  Wrist splint was placed.  Patient is allergic to meloxicam so we did not try another NSAID.  She will continue ibuprofen.  Patient will be discharged home  with prescriptions for a short course of Percocet. Patient is to follow up with PCP as directed. Patient is given ED precautions to return to the ED for any worsening or new symptoms.     ____________________________________________  FINAL CLINICAL IMPRESSION(S) / ED DIAGNOSES  Final diagnoses:  Left wrist pain  Tendinitis      NEW MEDICATIONS STARTED DURING THIS VISIT:  ED Discharge Orders        Ordered    HYDROcodone-acetaminophen (NORCO/VICODIN) 5-325 MG tablet  Every 6 hours PRN     02/28/18 1357          This chart was dictated using voice recognition software/Dragon. Despite best efforts to proofread, errors can occur which can change the meaning. Any change was purely unintentional.    Laban Emperor, PA-C 02/28/18 [place], [place],  MD 02/28/18 [phone]

## 2018-02-28 NOTE — ED Notes (Signed)
See triage note  Presents with pain to left wrist  No deformity noted   Good pulse  Also states she did not have an injury    But dose a lot of lifting

## 2018-03-07 ENCOUNTER — Other Ambulatory Visit: Payer: Commercial Managed Care - HMO

## 2018-03-10 ENCOUNTER — Ambulatory Visit: Payer: Commercial Managed Care - HMO | Admitting: Oncology

## 2018-03-10 ENCOUNTER — Ambulatory Visit: Payer: Commercial Managed Care - HMO

## 2018-03-14 ENCOUNTER — Inpatient Hospital Stay: Payer: 59 | Attending: Oncology

## 2018-03-14 DIAGNOSIS — E538 Deficiency of other specified B group vitamins: Secondary | ICD-10-CM | POA: Insufficient documentation

## 2018-03-14 DIAGNOSIS — Z79899 Other long term (current) drug therapy: Secondary | ICD-10-CM | POA: Insufficient documentation

## 2018-03-14 DIAGNOSIS — D500 Iron deficiency anemia secondary to blood loss (chronic): Secondary | ICD-10-CM

## 2018-03-14 DIAGNOSIS — Z90710 Acquired absence of both cervix and uterus: Secondary | ICD-10-CM | POA: Diagnosis not present

## 2018-03-14 DIAGNOSIS — D509 Iron deficiency anemia, unspecified: Secondary | ICD-10-CM | POA: Insufficient documentation

## 2018-03-14 LAB — CBC WITH DIFFERENTIAL/PLATELET
BASOS ABS: 0 10*3/uL (ref 0–0.1)
Basophils Relative: 1 %
Eosinophils Absolute: 0 10*3/uL (ref 0–0.7)
Eosinophils Relative: 1 %
HEMATOCRIT: 43.5 % (ref 35.0–47.0)
HEMOGLOBIN: 14.4 g/dL (ref 12.0–16.0)
LYMPHS ABS: 1.7 10*3/uL (ref 1.0–3.6)
Lymphocytes Relative: 46 %
MCH: 27.2 pg (ref 26.0–34.0)
MCHC: 33 g/dL (ref 32.0–36.0)
MCV: 82.4 fL (ref 80.0–100.0)
Monocytes Absolute: 0.3 10*3/uL (ref 0.2–0.9)
Monocytes Relative: 9 %
NEUTROS ABS: 1.5 10*3/uL (ref 1.4–6.5)
Neutrophils Relative %: 43 %
Platelets: 284 10*3/uL (ref 150–440)
RBC: 5.28 MIL/uL — AB (ref 3.80–5.20)
RDW: 13.8 % (ref 11.5–14.5)
WBC: 3.5 10*3/uL — AB (ref 3.6–11.0)

## 2018-03-14 LAB — VITAMIN B12: Vitamin B-12: 176 pg/mL — ABNORMAL LOW (ref 180–914)

## 2018-03-14 LAB — IRON AND TIBC
Iron: 82 ug/dL (ref 28–170)
Saturation Ratios: 23 % (ref 10.4–31.8)
TIBC: 358 ug/dL (ref 250–450)
UIBC: 276 ug/dL

## 2018-03-14 LAB — FERRITIN: FERRITIN: 43 ng/mL (ref 11–307)

## 2018-03-17 ENCOUNTER — Ambulatory Visit (INDEPENDENT_AMBULATORY_CARE_PROVIDER_SITE_OTHER): Payer: 59 | Admitting: Obstetrics and Gynecology

## 2018-03-17 DIAGNOSIS — E669 Obesity, unspecified: Secondary | ICD-10-CM

## 2018-03-17 DIAGNOSIS — Z6841 Body Mass Index (BMI) 40.0 and over, adult: Secondary | ICD-10-CM

## 2018-03-17 MED ORDER — CYANOCOBALAMIN 1000 MCG/ML IJ SOLN
1000.0000 ug | Freq: Once | INTRAMUSCULAR | Status: AC
  Administered 2018-03-17: 1000 ug via INTRAMUSCULAR

## 2018-03-17 NOTE — Progress Notes (Signed)
Pt is present today for bp, b12 injection and wt. Pt stated no side effects from the medication.  BP 118/80   Pulse 90   Ht 5\' 3"  (1.6 m)   Wt 253 lb 12.8 oz (115.1 kg)   LMP 07/07/2017   BMI 44.96 kg/m

## 2018-03-17 NOTE — Progress Notes (Deleted)
[place]  [phone]) [phone] [phone]) [phone]  ID: Sharon Marquez OB: 1971-04-19  MR#: [id number]  DHR#:[id number]  Patient Care Team: Patient, No Pcp Per as PCP - General (General Practice)  CHIEF COMPLAINT: Iron deficiency anemia, B-12 deficiency.  INTERVAL HISTORY: Patient returns to clinic today for repeat laboratory work and further evaluation. She currently feels well and is asymptomatic. She does not complain of weakness or fatigue today.  She recently hysterectomy without significant problems.  She has no neurologic complaints. She denies any recent fevers or illnesses. She has a good appetite and denies weight loss. She has no chest pain or shortness of breath. She denies any nausea, vomiting, constipation, or diarrhea. She has no melena or hematochezia. She has no urinary complaints. Patient offers no specific complaints today.  REVIEW OF SYSTEMS:   Review of Systems  Constitutional: Negative for fever, malaise/fatigue and weight loss.  Respiratory: Negative.  Negative for cough and shortness of breath.   Cardiovascular: Negative.  Negative for chest pain and leg swelling.  Gastrointestinal: Negative.  Negative for abdominal pain, blood in stool and melena.  Genitourinary: Negative.  Negative for hematuria.  Musculoskeletal: Negative.   Skin: Negative.  Negative for rash.  Neurological: Negative for weakness.  Psychiatric/Behavioral: Negative.  The patient is not nervous/anxious.     As per HPI. Otherwise, a complete review of systems is negative.  PAST MEDICAL HISTORY: Past Medical History:  Diagnosis Date  . Anemia   . Blood transfusion without reported diagnosis   . Family history of adverse reaction to anesthesia    "mother has difficulty waking up after surgery ."  . Fibroid uterus 2014  . History of blood transfusion 2014, 2015   for symptomatic anemia  . Vitamin B deficiency     PAST SURGICAL HISTORY: Past Surgical History:    Procedure Laterality Date  . ABDOMINAL HYSTERECTOMY    . CHOLECYSTECTOMY    . LAPAROSCOPIC VAGINAL HYSTERECTOMY WITH SALPINGECTOMY Bilateral 07/15/2017   Procedure: LAPAROSCOPIC ASSISTED VAGINAL HYSTERECTOMY WITH BILATERAL SALPINGECTOMY;  Surgeon: Rubie Maid, MD;  Location: [facility] ORS;  Service: Gynecology;  Laterality: Bilateral;    FAMILY HISTORY: Family History  Problem Relation Age of Onset  . Hyperthyroidism Mother   . Diabetes Mother   . Hypertension Mother   . Hyperthyroidism Maternal Aunt   . Diabetes Maternal Grandmother     ADVANCED DIRECTIVES (Y/N):  N  HEALTH MAINTENANCE: Social History   Tobacco Use  . Smoking status: Never Smoker  . Smokeless tobacco: Never Used  Substance Use Topics  . Alcohol use: No  . Drug use: No     Colonoscopy:  PAP:  Bone density:  Lipid panel:  Allergies  Allergen Reactions  . Meloxicam Itching  . Tramadol Itching and Swelling    Current Outpatient Medications  Medication Sig Dispense Refill  . Cetirizine HCl (ZYRTEC ALLERGY) 10 MG CAPS Take 1 capsule (10 mg total) by mouth daily. 30 capsule 11  . cyanocobalamin (,VITAMIN B-12,) 1000 MCG/ML injection Inject 1 mL (1,000 mcg total) into the muscle every 30 (thirty) days. 10 mL 1  . ibuprofen (ADVIL,MOTRIN) 800 MG tablet TAKE 1 TABLET BY MOUTH EVERY 8 HOURS AS NEEDED 180 tablet 0  . lidocaine (XYLOCAINE) 2 % jelly Apply 1 application topically as needed. (Patient not taking: Reported on 02/17/2018) 30 mL 2  . metroNIDAZOLE (FLAGYL) 500 MG tablet Take 1 tablet (500 mg total) by mouth 2 (two) times daily. (Patient not taking: Reported on 02/17/2018) 14 tablet  0  . phentermine (ADIPEX-P) 37.5 MG tablet Take 1 tablet (37.5 mg total) by mouth daily before breakfast. 30 tablet 2  . Vitamin D, Ergocalciferol, (DRISDOL) 50000 units CAPS capsule Take 1 capsule (50,000 Units total) by mouth 2 (two) times a week. 24 capsule 3   No current facility-administered medications for this visit.      OBJECTIVE: There were no vitals filed for this visit.   There is no height or weight on file to calculate BMI.    ECOG FS:0 - Asymptomatic  General: Well-developed, well-nourished, no acute distress. Eyes: Pink conjunctiva, anicteric sclera. Lungs: Clear to auscultation bilaterally. Heart: Regular rate and rhythm. No rubs, murmurs, or gallops. Abdomen: Soft, nontender, nondistended. No organomegaly noted, normoactive bowel sounds. Musculoskeletal: No edema, cyanosis, or clubbing. Neuro: Alert, answering all questions appropriately. Cranial nerves grossly intact. Skin: No rashes or petechiae noted. Psych: Normal affect.  LAB RESULTS:  Lab Results  Component Value Date   NA 138 07/10/2017   K 3.4 (L) 07/10/2017   CL 105 07/10/2017   CO2 26 07/10/2017   GLUCOSE 98 07/10/2017   BUN 13 07/10/2017   CREATININE 0.34 (L) 07/16/2017   CALCIUM 8.7 (L) 07/10/2017   PROT 7.3 10/16/2016   ALBUMIN 3.8 10/16/2016   AST 17 10/16/2016   ALT 24 10/16/2016   ALKPHOS 51 10/16/2016   BILITOT 0.3 10/16/2016   GFRNONAA >60 07/16/2017   GFRAA >60 07/16/2017    Lab Results  Component Value Date   WBC 3.5 (L) 03/14/2018   NEUTROABS 1.5 03/14/2018   HGB 14.4 03/14/2018   HCT 43.5 03/14/2018   MCV 82.4 03/14/2018   PLT 284 03/14/2018   Lab Results  Component Value Date   IRON 82 03/14/2018   TIBC 358 03/14/2018   IRONPCTSAT 23 03/14/2018   Lab Results  Component Value Date   FERRITIN 43 03/14/2018     STUDIES: Dg Wrist Complete Left  Result Date: 02/28/2018 CLINICAL DATA:  Pain after repetitive lifting EXAM: LEFT WRIST - COMPLETE 3+ VIEW COMPARISON:  None. FINDINGS: Frontal, oblique, lateral, and ulnar deviation scaphoid images were obtained. No fracture or dislocation. 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 No appreciable joint space narrowing or erosion. No intra-articular calcification. IMPRESSION: Congenital fusion of the lunate and  triquetrum bones. No fracture or dislocation. No appreciable arthropathy. Electronically Signed   By: Lowella Grip III M.D.   On: 02/28/2018 13:27    ASSESSMENT: Iron deficiency anemia, B-12 deficiency.  PLAN:    1. Iron deficiency anemia: Patient's hemoglobin has significantly improved and now was then within normal limits.  She continues to have reduced iron stores, therefore will proceed with one additional infusion of 510 mg IV Feraheme today.  Now that she has a hysterectomy, her heavy menses have subsided and she may no longer require as frequent infusions.  Return to clinic in 4 months with repeat laboratory work and further evaluation. 2. B-12 deficiency: Patient last received B-12 in October 2018.  Patient was given a B12 injection today.   3.  Heavy menses: Patient had total hysterectomy in November 2018.  Continue follow-up with OB/GYN as indicated.  Approximately 30 minutes was spent in discussion of which greater than 50% was consultation.  Patient expressed understanding and was in agreement with this plan. She also understands that She can call clinic at any time with any questions, concerns, or complaints.    Lloyd Huger, MD   03/17/2018 11:35  PM

## 2018-03-19 ENCOUNTER — Inpatient Hospital Stay: Payer: 59

## 2018-03-19 ENCOUNTER — Inpatient Hospital Stay: Payer: 59 | Admitting: Oncology

## 2018-04-01 ENCOUNTER — Other Ambulatory Visit: Payer: Self-pay

## 2018-04-01 ENCOUNTER — Ambulatory Visit: Payer: Commercial Managed Care - HMO | Attending: Sports Medicine | Admitting: Occupational Therapy

## 2018-04-01 ENCOUNTER — Encounter: Payer: Self-pay | Admitting: Occupational Therapy

## 2018-04-01 DIAGNOSIS — M6281 Muscle weakness (generalized): Secondary | ICD-10-CM | POA: Diagnosis present

## 2018-04-01 DIAGNOSIS — M25632 Stiffness of left wrist, not elsewhere classified: Secondary | ICD-10-CM

## 2018-04-01 DIAGNOSIS — M25532 Pain in left wrist: Secondary | ICD-10-CM | POA: Diagnosis not present

## 2018-04-01 NOTE — Patient Instructions (Signed)
Contrast - 3-5 x day  Use medication as prescription by MD  Use splint all time time except ADL's and 3 x day off for pain free AROM   Do not use L hand if pain increase with act more than 2/10

## 2018-04-01 NOTE — Therapy (Signed)
[place] [address] [place], [address] Phone: [phone]   Fax:  [phone]  Occupational Therapy Evaluation  Patient Details  Name: Sharon Marquez MRN: [id number] Date of Birth: 02/12/1971 Referring Provider: Candelaria Stagers    Encounter Date: 04/01/2018  OT End of Session - 04/01/18 1447    Visit Number  1    Number of Visits  12    Date for OT Re-Evaluation  05/13/18    OT Start Time  1308    OT Stop Time  1405    OT Time Calculation (min)  57 min    Activity Tolerance  Patient tolerated treatment well    Behavior During Therapy  [facility] for tasks assessed/performed       Past Medical History:  Diagnosis Date  . Anemia   . Blood transfusion without reported diagnosis   . Family history of adverse reaction to anesthesia    "mother has difficulty waking up after surgery ."  . Fibroid uterus 2014  . History of blood transfusion 2014, 2015   for symptomatic anemia  . Vitamin B deficiency     Past Surgical History:  Procedure Laterality Date  . ABDOMINAL HYSTERECTOMY    . CHOLECYSTECTOMY    . LAPAROSCOPIC VAGINAL HYSTERECTOMY WITH SALPINGECTOMY Bilateral 07/15/2017   Procedure: LAPAROSCOPIC ASSISTED VAGINAL HYSTERECTOMY WITH BILATERAL SALPINGECTOMY;  Surgeon: Rubie Maid, MD;  Location: [facility] ORS;  Service: Gynecology;  Laterality: Bilateral;    There were no vitals filed for this visit.  Subjective Assessment - 04/01/18 1307    Subjective   Had pain since end of June - cannot remember it come on - and I was doing a lot of heavy work at work - scale was broken and guy was eye balling the weight - and I had splint since the ER visit 7/5 and pain meds , and streiods, and Dr Candelaria Stagers gave me shot - but sitill hurting a lot - I do not sleep with splint at night tine - pain at night time the worse ,and I do try and use my hand - want to get back to work - take the medicine he gave me before bed time      Patient Stated Goals  I want to get my pain better - because I need my hand to work , and in every day activities     Currently in Pain?  Yes    Pain Score  8     Pain Location  Wrist    Pain Orientation  Left    Pain Descriptors / Indicators  Aching;Throbbing;Stabbing;Tingling;Shooting    Pain Type  Acute pain    Pain Onset  More than a month ago    Pain Frequency  Constant    Aggravating Factors   using it and any ROM         [facility] OT Assessment - 04/01/18 0001      Assessment   Medical Diagnosis  L wrist pain and tendinitis     Referring Provider  Kubinski     Onset Date/Surgical Date  02/28/18    Hand Dominance  Right      Precautions   Required Braces or Orthoses  -- pt arrive with prefab  wrist splint       Balance Screen   Has the patient fallen in the past 6 months  No      Prior Function   Vocation  Full time employment  Leisure  Work full time at [employer] - walk 3-4 miles a day , watch tv , and on phone       AROM   Left Forearm Pronation  85 Degrees    Left Forearm Supination  70 Degrees with pain     Left Wrist Extension  30 Degrees pain ulnar wrist    Left Wrist Flexion  24 Degrees pain at ulnar wrist    Left Wrist Radial Deviation  20 Degrees    Left Wrist Ulnar Deviation  23 Degrees          contrast done -and fabricate custom wrist splint for L  Hand and wrist  Review with pt HEP :   Contrast - 3-5 x day  Use medication as prescription by MD  Use splint all time time except ADL's and 3 x day off for pain free AROM   Do not use L hand if pain increase with act more than 2/10           OT Education - 04/01/18 1446    Education Details  findings , homeprogram -use of splint , modalities , pain free use and AROM     Person(s) Educated  Patient    Methods  Explanation;Demonstration;Tactile cues;Verbal cues;Handout    Comprehension  Verbalized understanding;Returned demonstration       OT Short Term Goals - 04/01/18 1720      OT  SHORT TERM GOAL #1   Title   Pt to be ind in HEP and wearing of splint to decrease pain at L wrist to less than 5/10 at the worse     Baseline  pain range from 5-10/10 ;  pain in splint 10/10 -     Time  3    Period  Weeks    Status  New    Target Date  04/22/18      OT SHORT TERM GOAL #2   Title  Pain on PRWHE improve with more than 20 points     Baseline  at eval PRWHE pain score 45/50     Time  3    Period  Weeks    Status  New    Target Date  04/22/18        OT Long Term Goals - 04/01/18 1722      OT LONG TERM GOAL #1   Title  Pt L wrist AROM improve  with more than 20 degrees for flexion , ext - and 5 -10 degrees for others to use in more than 50% of ADL's without increase symptoms     Baseline  see flowsheet - increase pain with all AROM - pain increase to 101/10     Time  4    Period  Weeks    Status  New    Target Date  04/29/18      OT LONG TERM GOAL #2   Title  L wrist strength increase for pt to do do isometric and 1 lbs  in all planes to wean out of splint     Baseline  no strength - decrease AROM - splint to be wear all the time     Time  6    Period  Weeks    Status  New    Target Date  05/13/18      OT LONG TERM GOAL #3   Title  Function score on PRWHE improve with more than 20 points     Baseline  at  eval PRWHE function score 34/50     Time  6    Period  Weeks    Status  New    Target Date  05/13/18            Plan - 04/01/18 1448    Clinical Impression Statement  Pt present at OT eval with pain over the ulnar side of L wrist - Her pain began suddenly but without acute injury at the end of June/early July 2019. She denies any previous accident, injury, trauma to her wrist. She was seen in the ER on /5  initially  where x-rays did not show any acute bony pathology (although she had congential triquetrum-lunate fusion) so she was treated with wrist splint, Norco. She reported to the doctor at her work who also treated her with meloxicam, steroid  burst without relief. She was seen on 7/30 by Dr Candelaria Stagers and had shot at [facility]  - that was numb for 2 days but pain back again . She has been out of work since about 03/09/18 - pt show decrease AROM wat L wrist in all planes with increase pain - and pain  with fisting and gripping - extentoin of digits too - pain mostly on ulnar side of wrist - but tender even on volar and dorsal wrist - hard to asses specific FCU or ECU - or TFCC ??  - fabricated custom wrist splint for pt to wear at all times - except ADL's and take off 3 x day for pain free AROM - to do contrast to decrease pain and inflammation -and not use hand and wrist if pain increase more than 2/10 - pt was still trying to use it - even if pain increase to 8-10/10 - and only taking medicine only before night time - pt to do homeprogram for rest of week  - pt can benefit from OT servicies     Occupational performance deficits (Please refer to evaluation for details):  ADL's;IADL's;Work;Play;Leisure    Rehab Potential  Good    OT Frequency  2x / week    OT Duration  4 weeks    OT Treatment/Interventions  Self-care/ADL training;Iontophoresis;Patient/family education;Splinting;Contrast Bath;Ultrasound;Fluidtherapy;Passive range of motion;Manual Therapy    Plan  assess progress with homeprogram     Clinical Decision Making  Several treatment options, min-mod task modification necessary    OT Home Exercise Plan  see pt instruction    Consulted and Agree with Plan of Care  Patient       Patient will benefit from skilled therapeutic intervention in order to improve the following deficits and impairments:  Pain, Impaired flexibility, Increased edema, Decreased coordination, Decreased strength, Decreased range of motion, Impaired UE functional use, Decreased knowledge of precautions  Visit Diagnosis: Pain in left wrist - Plan: Ot plan of care cert/re-cert  Stiffness of left wrist, not elsewhere classified - Plan: Ot plan of care  cert/re-cert  Muscle weakness (generalized) - Plan: Ot plan of care cert/re-cert    Problem List Patient Active Problem List   Diagnosis Date Noted  . S/P laparoscopic assisted vaginal hysterectomy (LAVH) 07/15/2017  . Vitamin D deficiency 02/19/2017  . B12 deficiency 10/22/2016  . Morbid obesity ([place]) 08/05/2016  . Abnormal uterine bleeding 08/05/2016  . History of blood transfusion 08/02/2016  . Iron deficiency anemia 07/11/2016  . Fibroid uterus 08/27/2012    Rosalyn Gess OTR/l,CLT 04/01/2018, 5:27 PM  [place] [facility] [address] [place], [place],  [place] Phone: [phone]   Fax:  [phone]  Name: Sharon Marquez MRN: [id number] Date of Birth: 1971/04/11

## 2018-04-08 ENCOUNTER — Ambulatory Visit: Payer: Commercial Managed Care - HMO | Admitting: Occupational Therapy

## 2018-04-08 DIAGNOSIS — M25632 Stiffness of left wrist, not elsewhere classified: Secondary | ICD-10-CM

## 2018-04-08 DIAGNOSIS — M25532 Pain in left wrist: Secondary | ICD-10-CM | POA: Diagnosis not present

## 2018-04-08 DIAGNOSIS — M6281 Muscle weakness (generalized): Secondary | ICD-10-CM

## 2018-04-08 NOTE — Patient Instructions (Signed)
Same HEP  

## 2018-04-08 NOTE — Therapy (Signed)
[place] [facility] [address] [place], [address] Phone: [phone]   Fax:  [phone]  Occupational Therapy Treatment  Patient Details  Name: Sharon Marquez MRN: [id number] Date of Birth: 1971/06/09 Referring Provider: Candelaria Stagers    Encounter Date: 04/08/2018  OT End of Session - 04/08/18 0947    Visit Number  2    Number of Visits  12    Date for OT Re-Evaluation  05/13/18    OT Start Time  0901    OT Stop Time  0944    OT Time Calculation (min)  43 min    Activity Tolerance  Patient tolerated treatment well    Behavior During Therapy  [facility] for tasks assessed/performed       Past Medical History:  Diagnosis Date  . Anemia   . Blood transfusion without reported diagnosis   . Family history of adverse reaction to anesthesia    "mother has difficulty waking up after surgery ."  . Fibroid uterus 2014  . History of blood transfusion 2014, 2015   for symptomatic anemia  . Vitamin B deficiency     Past Surgical History:  Procedure Laterality Date  . ABDOMINAL HYSTERECTOMY    . CHOLECYSTECTOMY    . LAPAROSCOPIC VAGINAL HYSTERECTOMY WITH SALPINGECTOMY Bilateral 07/15/2017   Procedure: LAPAROSCOPIC ASSISTED VAGINAL HYSTERECTOMY WITH BILATERAL SALPINGECTOMY;  Surgeon: Rubie Maid, MD;  Location: [facility] ORS;  Service: Gynecology;  Laterality: Bilateral;    There were no vitals filed for this visit.  Subjective Assessment - 04/08/18 0921    Subjective   Pain is better- down to 5/10 - did wear the splint and did my exercises - 2-3 x day - I love the hot and cold - the worse is twisting my wrist and dropping my wrist - it pulls    Patient Stated Goals  I want to get my pain better - because I need my hand to work , and in every day activities     Currently in Pain?  Yes    Pain Score  5     Pain Location  Wrist    Pain Orientation  Left    Pain Descriptors / Indicators  Aching    Pain Type  Acute pain    Pain Onset  More than a month ago    Pain Frequency  Constant         [facility] OT Assessment - 04/08/18 0001      AROM   Left Forearm Supination  70 Degrees   5/10 pain    Left Wrist Extension  40 Degrees   5/10 pain   Left Wrist Flexion  25 Degrees   5/10 pain       Measure AROM - pt report pain decrease to 4-5/10 in splint - 5/10 out of splint  Worse with wrist extention - letting it hang        OT Treatments/Exercises (OP) - 04/08/18 0001      LUE Fluidotherapy   Number Minutes Fluidotherapy  11 Minutes    LUE Fluidotherapy Location  Hand;Wrist    Comments  AROM at [facility] - alternate 3 x ice inbetween for 1-2 min         Contrast - 3-5 x day at home to cont with  Use medication as prescription by MD  Use splint all time time except ADL's and 3 x day off for pain free AROM  Doing okay with splint  AROM for wrist in all planes   she can change wrist extention - that gravity assist to decrease pull   keep AROM with slight pull - to stop        OT Education - 04/08/18 0946    Education Details  progress , HEP  - use of splint     Person(s) Educated  Patient;Spouse    Methods  Explanation;Demonstration    Comprehension  Verbalized understanding;Returned demonstration       OT Short Term Goals - 04/01/18 1720      OT SHORT TERM GOAL #1   Title   Pt to be ind in HEP and wearing of splint to decrease pain at L wrist to less than 5/10 at the worse     Baseline  pain range from 5-10/10 ;  pain in splint 10/10 -     Time  3    Period  Weeks    Status  New    Target Date  04/22/18      OT SHORT TERM GOAL #2   Title  Pain on PRWHE improve with more than 20 points     Baseline  at eval PRWHE pain score 45/50     Time  3    Period  Weeks    Status  New    Target Date  04/22/18        OT Long Term Goals - 04/01/18 1722      OT LONG TERM GOAL #1   Title  Pt L wrist AROM improve  with more than 20 degrees for flexion , ext - and 5 -10 degrees for others  to use in more than 50% of ADL's without increase symptoms     Baseline  see flowsheet - increase pain with all AROM - pain increase to 101/10     Time  4    Period  Weeks    Status  New    Target Date  04/29/18      OT LONG TERM GOAL #2   Title  L wrist strength increase for pt to do do isometric and 1 lbs  in all planes to wean out of splint     Baseline  no strength - decrease AROM - splint to be wear all the time     Time  6    Period  Weeks    Status  New    Target Date  05/13/18      OT LONG TERM GOAL #3   Title  Function score on PRWHE improve with more than 20 points     Baseline  at eval PRWHE function score 34/50     Time  6    Period  Weeks    Status  New    Target Date  05/13/18            Plan - 04/08/18 0947    Clinical Impression Statement  Pt's pain decrease from 8-9/10 to 4-5/10 pain - and showed some progress  in AROM - about 5 degrees- pain worse in flexion of wrist and supination- but also pain increase with digits extention at 4th and 5th  - over dorsal wrist  - pt to cont wtih same HEP but have gravity assist with wrist flexion     Occupational performance deficits (Please refer to evaluation for details):  ADL's;IADL's;Work;Play;Leisure    Rehab Potential  Good    OT Frequency  2x / week    OT Duration  4 weeks    OT Treatment/Interventions  Self-care/ADL training;Iontophoresis;Patient/family education;Splinting;Contrast Bath;Ultrasound;Fluidtherapy;Passive range of motion;Manual Therapy    Plan  assess progress with homeprogram - and pain decrease - assess if can do ionto     Clinical Decision Making  Several treatment options, min-mod task modification necessary    OT Home Exercise Plan  see pt instruction    Consulted and Agree with Plan of Care  Patient       Patient will benefit from skilled therapeutic intervention in order to improve the following deficits and impairments:  Pain, Impaired flexibility, Increased edema, Decreased coordination,  Decreased strength, Decreased range of motion, Impaired UE functional use, Decreased knowledge of precautions  Visit Diagnosis: Pain in left wrist  Stiffness of left wrist, not elsewhere classified  Muscle weakness (generalized)    Problem List Patient Active Problem List   Diagnosis Date Noted  . S/P laparoscopic assisted vaginal hysterectomy (LAVH) 07/15/2017  . Vitamin D deficiency 02/19/2017  . B12 deficiency 10/22/2016  . Morbid obesity ([place]) 08/05/2016  . Abnormal uterine bleeding 08/05/2016  . History of blood transfusion 08/02/2016  . Iron deficiency anemia 07/11/2016  . Fibroid uterus 08/27/2012    Rosalyn Gess OTR/l,CLT 04/08/2018, 9:52 AM  [place] [facility] [address], [place], [address] Phone: [phone]   Fax:  [phone]  Name: Sharon Marquez MRN: [id number] Date of Birth: 18-Jan-1971

## 2018-04-11 ENCOUNTER — Ambulatory Visit: Payer: Commercial Managed Care - HMO | Admitting: Occupational Therapy

## 2018-04-11 DIAGNOSIS — M25532 Pain in left wrist: Secondary | ICD-10-CM | POA: Diagnosis not present

## 2018-04-11 DIAGNOSIS — M6281 Muscle weakness (generalized): Secondary | ICD-10-CM

## 2018-04-11 DIAGNOSIS — M25632 Stiffness of left wrist, not elsewhere classified: Secondary | ICD-10-CM

## 2018-04-11 NOTE — Patient Instructions (Signed)
Same HEP - do not sleep without splint

## 2018-04-11 NOTE — Therapy (Signed)
[place] [facility] [address], [place], [address] Phone: [phone]   Fax:  [phone]  Occupational Therapy Treatment  Patient Details  Name: Sharon Marquez MRN: [id number] Date of Birth: 09-26-1970 Referring Provider: Candelaria Stagers    Encounter Date: 04/11/2018  OT End of Session - 04/11/18 0908    Visit Number  3    Number of Visits  12    Date for OT Re-Evaluation  05/13/18    OT Start Time  0859    OT Stop Time  0945    OT Time Calculation (min)  46 min    Activity Tolerance  Patient tolerated treatment well    Behavior During Therapy  [facility] for tasks assessed/performed       Past Medical History:  Diagnosis Date  . Anemia   . Blood transfusion without reported diagnosis   . Family history of adverse reaction to anesthesia    "mother has difficulty waking up after surgery ."  . Fibroid uterus 2014  . History of blood transfusion 2014, 2015   for symptomatic anemia  . Vitamin B deficiency     Past Surgical History:  Procedure Laterality Date  . ABDOMINAL HYSTERECTOMY    . CHOLECYSTECTOMY    . LAPAROSCOPIC VAGINAL HYSTERECTOMY WITH SALPINGECTOMY Bilateral 07/15/2017   Procedure: LAPAROSCOPIC ASSISTED VAGINAL HYSTERECTOMY WITH BILATERAL SALPINGECTOMY;  Surgeon: Rubie Maid, MD;  Location: [facility] ORS;  Service: Gynecology;  Laterality: Bilateral;    There were no vitals filed for this visit.  Subjective Assessment - 04/11/18 0906    Subjective   MY pain is worse since Wed - did not do anything different except slept without my splint - pain up to 6-7/10 again     Patient Stated Goals  I want to get my pain better - because I need my hand to work , and in every day activities     Currently in Pain?  Yes    Pain Score  7     Pain Location  Wrist    Pain Orientation  Left    Pain Descriptors / Indicators  Aching;Sharp    Pain Type  Acute pain    Pain Onset  More than a month ago         [facility] OT  Assessment - 04/11/18 0001      AROM   Left Wrist Extension  35 Degrees    Left Wrist Flexion  30 Degrees        Pain free AROM in all planes- pain along ulnar side of hand to 5th with sup  Tenderness cont on ulnar side of wrist   pt pain increase since last time - report she felt good after last time - did not sleep with splint Tues or Wed night - and pain since then  Pt to cont with contrast , custom splint        OT Treatments/Exercises (OP) - 04/11/18 0001      Iontophoresis   Type of Iontophoresis  Dexamethasone    Location  ulnar wrist     Dose  small - 1.4 current  and to decrease to 0.6 - cut it short and put ice on wrist and hand last 8 min     Time  17      LUE Fluidotherapy   Number Minutes Fluidotherapy  11 Minutes    LUE Fluidotherapy Location  Hand;Wrist    Comments  alternate ice 2 x with heat  to decrease pain        Skin check done prior and afterwards- no issues -but had pain increase at 1.4 current  Decrease 2 x - and provided ice over ionto -  Same HEP       OT Education - 04/11/18 0908    Education Details  ionto and splint use - pain free ROM     Person(s) Educated  Patient    Methods  Explanation;Demonstration    Comprehension  Returned demonstration;Verbalized understanding       OT Short Term Goals - 04/01/18 1720      OT SHORT TERM GOAL #1   Title   Pt to be ind in HEP and wearing of splint to decrease pain at L wrist to less than 5/10 at the worse     Baseline  pain range from 5-10/10 ;  pain in splint 10/10 -     Time  3    Period  Weeks    Status  New    Target Date  04/22/18      OT SHORT TERM GOAL #2   Title  Pain on PRWHE improve with more than 20 points     Baseline  at eval PRWHE pain score 45/50     Time  3    Period  Weeks    Status  New    Target Date  04/22/18        OT Long Term Goals - 04/01/18 1722      OT LONG TERM GOAL #1   Title  Pt L wrist AROM improve  with more than 20 degrees for flexion , ext -  and 5 -10 degrees for others to use in more than 50% of ADL's without increase symptoms     Baseline  see flowsheet - increase pain with all AROM - pain increase to 101/10     Time  4    Period  Weeks    Status  New    Target Date  04/29/18      OT LONG TERM GOAL #2   Title  L wrist strength increase for pt to do do isometric and 1 lbs  in all planes to wean out of splint     Baseline  no strength - decrease AROM - splint to be wear all the time     Time  6    Period  Weeks    Status  New    Target Date  05/13/18      OT LONG TERM GOAL #3   Title  Function score on PRWHE improve with more than 20 points     Baseline  at eval PRWHE function score 34/50     Time  6    Period  Weeks    Status  New    Target Date  05/13/18            Plan - 04/11/18 0908    Clinical Impression Statement  Pt pain increase again since last time to 7/10 - appear pt slept without splint Tuesday night - pain increase since then again - pt could not tolerate Ionto at 1.4 current had to decreaes 2 x -and put ice over - cut short after 15 min - will cont next week again 2 session - pain with any AROM of wrist and digits flexion and extention  on ulnar side of wrist     Occupational performance deficits (Please refer to evaluation for details):  ADL's;IADL's;Work;Play;Leisure    Rehab Potential  Good    OT Frequency  2x / week    OT Duration  4 weeks    OT Treatment/Interventions  Self-care/ADL training;Iontophoresis;Patient/family education;Splinting;Contrast Bath;Ultrasound;Fluidtherapy;Passive range of motion;Manual Therapy    Plan  assess progress with homeprogram - and pain decrease - assess if can do ionto again     Clinical Decision Making  Several treatment options, min-mod task modification necessary    OT Home Exercise Plan  see pt instruction    Consulted and Agree with Plan of Care  Patient       Patient will benefit from skilled therapeutic intervention in order to improve the following  deficits and impairments:  Pain, Impaired flexibility, Increased edema, Decreased coordination, Decreased strength, Decreased range of motion, Impaired UE functional use, Decreased knowledge of precautions  Visit Diagnosis: Pain in left wrist  Stiffness of left wrist, not elsewhere classified  Muscle weakness (generalized)    Problem List Patient Active Problem List   Diagnosis Date Noted  . S/P laparoscopic assisted vaginal hysterectomy (LAVH) 07/15/2017  . Vitamin D deficiency 02/19/2017  . B12 deficiency 10/22/2016  . Morbid obesity ([place]) 08/05/2016  . Abnormal uterine bleeding 08/05/2016  . History of blood transfusion 08/02/2016  . Iron deficiency anemia 07/11/2016  . Fibroid uterus 08/27/2012    Rosalyn Gess OTR/L,CLT  04/11/2018, 10:00 AM  [place] [facility] [address] [place], [address] Phone: [phone]   Fax:  [phone]  Name: Sharon Marquez MRN: [id number] Date of Birth: May 17, 1971

## 2018-04-16 ENCOUNTER — Encounter: Payer: Self-pay | Admitting: Obstetrics and Gynecology

## 2018-04-16 ENCOUNTER — Ambulatory Visit: Payer: Commercial Managed Care - HMO | Admitting: Occupational Therapy

## 2018-04-16 ENCOUNTER — Ambulatory Visit (INDEPENDENT_AMBULATORY_CARE_PROVIDER_SITE_OTHER): Payer: 59 | Admitting: Obstetrics and Gynecology

## 2018-04-16 VITALS — BP 118/77 | HR 78 | Ht 63.0 in | Wt 257.4 lb

## 2018-04-16 DIAGNOSIS — M25532 Pain in left wrist: Secondary | ICD-10-CM | POA: Diagnosis not present

## 2018-04-16 DIAGNOSIS — E559 Vitamin D deficiency, unspecified: Secondary | ICD-10-CM | POA: Diagnosis not present

## 2018-04-16 DIAGNOSIS — E538 Deficiency of other specified B group vitamins: Secondary | ICD-10-CM

## 2018-04-16 DIAGNOSIS — Z6841 Body Mass Index (BMI) 40.0 and over, adult: Secondary | ICD-10-CM | POA: Diagnosis not present

## 2018-04-16 DIAGNOSIS — M25632 Stiffness of left wrist, not elsewhere classified: Secondary | ICD-10-CM

## 2018-04-16 DIAGNOSIS — M6281 Muscle weakness (generalized): Secondary | ICD-10-CM

## 2018-04-16 NOTE — Patient Instructions (Signed)
Same - contrast , pain free AROM  And cont with splint wearing to decrease pain  And use of hand pain free -or keep pain under 2/10

## 2018-04-16 NOTE — Progress Notes (Signed)
SUBJECTIVE:  48 y.o. here for follow-up weight loss visit, previously seen 4 weeks ago. Denies any concerns and feels like medication is not working as desired. She has not lost any weight and has went up a few pounds. States she is walking every morning and decreased food intake. Is drinking 3-4 bottles of water a day. Frustrated over lack of weight loss.   OBJECTIVE:  BP 118/77   Pulse 78   Ht 5\' 3"  (1.6 m)   Wt 257 lb 6.4 oz (116.8 kg)   LMP 07/07/2017   BMI 45.60 kg/m   Body mass index is 45.6 kg/m. Patient appears well. ASSESSMENT:  Obesity- not responding to weight loss plan Vitamin B12 & D deficiencies.  PLAN:  To continue with current medications. B12 1068mcg/ml injection given, labs obtained to rule out other etiology- will follow up accordingly. Encouraged increasing water intake to 8 bottles a day. Possibly adding hand or ankle weights to walks.   RTC in 4 weeks as planned  Melody Kingston, CNM

## 2018-04-16 NOTE — Therapy (Signed)
[facility] [address] [place], [address] Phone: [phone]   Fax:  [phone]  Occupational Therapy Treatment  Patient Details  Name: Sharon Marquez MRN: [id number] Date of Birth: 1970/09/01 Referring Provider: Candelaria Stagers    Encounter Date: 04/16/2018  OT End of Session - 04/16/18 1004    Visit Number  4    Number of Visits  12    Date for OT Re-Evaluation  05/13/18    OT Start Time  0935    OT Stop Time  1020    OT Time Calculation (min)  45 min    Activity Tolerance  Patient tolerated treatment well    Behavior During Therapy  [facility] for tasks assessed/performed       Past Medical History:  Diagnosis Date  . Anemia   . Blood transfusion without reported diagnosis   . Family history of adverse reaction to anesthesia    "mother has difficulty waking up after surgery ."  . Fibroid uterus 2014  . History of blood transfusion 2014, 2015   for symptomatic anemia  . Vitamin B deficiency     Past Surgical History:  Procedure Laterality Date  . ABDOMINAL HYSTERECTOMY    . CHOLECYSTECTOMY    . LAPAROSCOPIC VAGINAL HYSTERECTOMY WITH SALPINGECTOMY Bilateral 07/15/2017   Procedure: LAPAROSCOPIC ASSISTED VAGINAL HYSTERECTOMY WITH BILATERAL SALPINGECTOMY;  Surgeon: Rubie Maid, MD;  Location: [facility] ORS;  Service: Gynecology;  Laterality: Bilateral;    There were no vitals filed for this visit.  Subjective Assessment - 04/16/18 1000    Subjective   It was rough after last time - we did the medication patch but then the hot and cold helps the pain - pain come down again to 5/10 - stays tender and painfull on the pinkie side of wrist -     Patient Stated Goals  I want to get my pain better - because I need my hand to work , and in every day activities     Currently in Pain?  Yes    Pain Score  5     Pain Location  Wrist    Pain Orientation  Left    Pain Descriptors / Indicators  Aching;Stabbing    Pain  Type  Acute pain    Pain Onset  More than a month ago    Pain Frequency  Constant         [facility] OT Assessment - 04/16/18 0001      AROM   Left Forearm Pronation  85 Degrees    Left Forearm Supination  85 Degrees    Left Wrist Extension  30 Degrees    Left Wrist Flexion  30 Degrees    Left Wrist Radial Deviation  20 Degrees    Left Wrist Ulnar Deviation  35 Degrees      pt arrive with splint on - pain 5/10  Assess AROM for wrist flexion , extention - increase pain  AROM WFL for UD and RD - also pain  Sup and pron about 85 - but do not increase pain  Tender over ulnar side of wrist  ?? TFCC tear  Pain with grip and extention of digits - on ulnar side of wrist  Call MD to find out about her appt - schedule for next week Tues   pain decrease while in contrast but then increase with ROM       OT Treatments/Exercises (OP) - 04/16/18 0001  Ultrasound   Ultrasound Location  Ulnar side of wrist     Ultrasound Parameters  3.3MHZ, 20%, 0.8 intensity     Ultrasound Goals  Pain      LUE Contrast Bath   Time  11 minutes    Comments  prior to ROM and soft tissue       [place] this date done at end of session to decrease pain  Will assess if can try ionto again next time       OT Education - 04/16/18 1004    Education Details  use of splint - done [place] and painfree use and ROM     Person(s) Educated  Patient    Methods  Demonstration;Explanation    Comprehension  Verbalized understanding;Returned demonstration       OT Short Term Goals - 04/01/18 1720      OT SHORT TERM GOAL #1   Title   Pt to be ind in HEP and wearing of splint to decrease pain at L wrist to less than 5/10 at the worse     Baseline  pain range from 5-10/10 ;  pain in splint 10/10 -     Time  3    Period  Weeks    Status  New    Target Date  04/22/18      OT SHORT TERM GOAL #2   Title  Pain on PRWHE improve with more than 20 points     Baseline  at eval PRWHE pain score 45/50     Time  3     Period  Weeks    Status  New    Target Date  04/22/18        OT Long Term Goals - 04/01/18 1722      OT LONG TERM GOAL #1   Title  Pt L wrist AROM improve  with more than 20 degrees for flexion , ext - and 5 -10 degrees for others to use in more than 50% of ADL's without increase symptoms     Baseline  see flowsheet - increase pain with all AROM - pain increase to 101/10     Time  4    Period  Weeks    Status  New    Target Date  04/29/18      OT LONG TERM GOAL #2   Title  L wrist strength increase for pt to do do isometric and 1 lbs  in all planes to wean out of splint     Baseline  no strength - decrease AROM - splint to be wear all the time     Time  6    Period  Weeks    Status  New    Target Date  05/13/18      OT LONG TERM GOAL #3   Title  Function score on PRWHE improve with more than 20 points     Baseline  at eval PRWHE function score 34/50     Time  6    Period  Weeks    Status  New    Target Date  05/13/18            Plan - 04/16/18 1004    Clinical Impression Statement  Pt pain still about the same 5/10 - with wearing wrist splint - using it without increase pain -  pt cont to be tender and pain over ulnar side of wrist- with and without ROM -  ? TFCC tear or  partial tear - done this date [place] instead of ionto - will assess next visit and possiby do ionto  again - pt to see MD next Tuesday     Occupational performance deficits (Please refer to evaluation for details):  ADL's;IADL's;Work;Play;Leisure    Rehab Potential  Good    OT Frequency  2x / week    OT Duration  4 weeks    OT Treatment/Interventions  Self-care/ADL training;Iontophoresis;Patient/family education;Splinting;Contrast Bath;Ultrasound;Fluidtherapy;Passive range of motion;Manual Therapy    Clinical Decision Making  Several treatment options, min-mod task modification necessary    OT Home Exercise Plan  see pt instruction    Consulted and Agree with Plan of Care  Patient       Patient will  benefit from skilled therapeutic intervention in order to improve the following deficits and impairments:  Pain, Impaired flexibility, Increased edema, Decreased coordination, Decreased strength, Decreased range of motion, Impaired UE functional use, Decreased knowledge of precautions  Visit Diagnosis: Pain in left wrist  Stiffness of left wrist, not elsewhere classified  Muscle weakness (generalized)    Problem List Patient Active Problem List   Diagnosis Date Noted  . S/P laparoscopic assisted vaginal hysterectomy (LAVH) 07/15/2017  . Vitamin D deficiency 02/19/2017  . B12 deficiency 10/22/2016  . Morbid obesity ([place]) 08/05/2016  . Abnormal uterine bleeding 08/05/2016  . History of blood transfusion 08/02/2016  . Iron deficiency anemia 07/11/2016  . Fibroid uterus 08/27/2012    Rosalyn Gess OTR/L,CLT 04/16/2018, 12:43 PM  [facility] [address], [place], [address] Phone: [phone]   Fax:  [phone]  Name: Sharon Marquez MRN: [id number] Date of Birth: Feb 07, 1971

## 2018-04-17 ENCOUNTER — Telehealth: Payer: Self-pay | Admitting: *Deleted

## 2018-04-17 ENCOUNTER — Other Ambulatory Visit: Payer: Self-pay | Admitting: Obstetrics and Gynecology

## 2018-04-17 DIAGNOSIS — E119 Type 2 diabetes mellitus without complications: Secondary | ICD-10-CM

## 2018-04-17 DIAGNOSIS — E559 Vitamin D deficiency, unspecified: Secondary | ICD-10-CM

## 2018-04-17 LAB — COMPREHENSIVE METABOLIC PANEL
A/G RATIO: 1.3 (ref 1.2–2.2)
ALK PHOS: 66 IU/L (ref 39–117)
ALT: 20 IU/L (ref 0–32)
AST: 14 IU/L (ref 0–40)
Albumin: 3.8 g/dL (ref 3.5–5.5)
BILIRUBIN TOTAL: 0.2 mg/dL (ref 0.0–1.2)
BUN/Creatinine Ratio: 18 (ref 9–23)
BUN: 12 mg/dL (ref 6–24)
CALCIUM: 9.4 mg/dL (ref 8.7–10.2)
CHLORIDE: 101 mmol/L (ref 96–106)
CO2: 25 mmol/L (ref 20–29)
Creatinine, Ser: 0.65 mg/dL (ref 0.57–1.00)
GFR calc Af Amer: 122 mL/min/{1.73_m2} (ref >59)
GFR calc non Af Amer: 106 mL/min/{1.73_m2} (ref >59)
Globulin, Total: 2.9 g/dL (ref 1.5–4.5)
Glucose: 113 mg/dL — ABNORMAL HIGH (ref 65–99)
POTASSIUM: 4.1 mmol/L (ref 3.5–5.2)
Sodium: 140 mmol/L (ref 134–144)
Total Protein: 6.7 g/dL (ref 6.0–8.5)

## 2018-04-17 LAB — TSH: TSH: 1.82 u[IU]/mL (ref 0.450–4.500)

## 2018-04-17 LAB — VITAMIN B12: VITAMIN B 12: 400 pg/mL (ref 232–1245)

## 2018-04-17 LAB — HEMOGLOBIN A1C
ESTIMATED AVERAGE GLUCOSE: 143 mg/dL
HEMOGLOBIN A1C: 6.6 % — AB (ref 4.8–5.6)

## 2018-04-17 LAB — VITAMIN D 25 HYDROXY (VIT D DEFICIENCY, FRACTURES): VIT D 25 HYDROXY: 28 ng/mL — AB (ref 30.0–100.0)

## 2018-04-17 MED ORDER — METFORMIN HCL 500 MG PO TABS: 500.0000 mg | ORAL_TABLET | Freq: Two times a day (BID) | ORAL | 6 refills | Status: DC

## 2018-04-17 MED ORDER — VITAMIN D (ERGOCALCIFEROL) 1.25 MG (50000 UNIT) PO CAPS: 50000.0000 [IU] | ORAL_CAPSULE | ORAL | 3 refills | Status: DC

## 2018-04-17 NOTE — Telephone Encounter (Signed)
-----   Message from Joylene Igo, [place] sent at 04/17/2018  9:28 AM EDT ----- Please mail info on diabetes.

## 2018-04-17 NOTE — Telephone Encounter (Signed)
Mailed info to pt 

## 2018-04-18 ENCOUNTER — Ambulatory Visit: Payer: Commercial Managed Care - HMO | Admitting: Occupational Therapy

## 2018-04-18 DIAGNOSIS — M25532 Pain in left wrist: Secondary | ICD-10-CM

## 2018-04-18 DIAGNOSIS — M6281 Muscle weakness (generalized): Secondary | ICD-10-CM

## 2018-04-18 DIAGNOSIS — M25632 Stiffness of left wrist, not elsewhere classified: Secondary | ICD-10-CM

## 2018-04-18 NOTE — Therapy (Signed)
[facility] [address] [place], [address] Phone: [phone]   Fax:  [phone]  Occupational Therapy Treatment  Patient Details  Name: Sharon Marquez MRN: [id number] Date of Birth: 04/25/1971 Referring Provider: Candelaria Stagers    Encounter Date: 04/18/2018  OT End of Session - 04/18/18 1014    Visit Number  5    Number of Visits  12    Date for OT Re-Evaluation  05/13/18    OT Start Time  0942    OT Stop Time  1040    OT Time Calculation (min)  58 min    Activity Tolerance  Patient tolerated treatment well    Behavior During Therapy  [facility] for tasks assessed/performed       Past Medical History:  Diagnosis Date  . Anemia   . Blood transfusion without reported diagnosis   . Family history of adverse reaction to anesthesia    "mother has difficulty waking up after surgery ."  . Fibroid uterus 2014  . History of blood transfusion 2014, 2015   for symptomatic anemia  . Vitamin B deficiency     Past Surgical History:  Procedure Laterality Date  . ABDOMINAL HYSTERECTOMY    . CHOLECYSTECTOMY    . LAPAROSCOPIC VAGINAL HYSTERECTOMY WITH SALPINGECTOMY Bilateral 07/15/2017   Procedure: LAPAROSCOPIC ASSISTED VAGINAL HYSTERECTOMY WITH BILATERAL SALPINGECTOMY;  Surgeon: Rubie Maid, MD;  Location: [facility];  Service: Gynecology;  Laterality: Bilateral;    There were no vitals filed for this visit.  Subjective Assessment - 04/18/18 0957    Subjective   I worked it - but the pain still there on the side of wrist- same spot    Patient Stated Goals  I want to get my pain better - because I need my hand to work , and in every day activities     Currently in Pain?  Yes    Pain Score  5     Pain Location  Wrist    Pain Orientation  Left    Pain Descriptors / Indicators  Aching;Stabbing    Pain Type  Acute pain    Pain Onset  More than a month ago    Pain Frequency  Constant         [facility] OT Assessment -  04/18/18 0001      AROM   Left Wrist Extension  35 Degrees    Left Wrist Flexion  48 Degrees    Left Wrist Radial Deviation  20 Degrees    Left Wrist Ulnar Deviation  30 Degrees        pt arrive with splint on - pain 5/10  Assess AROM for wrist flexion , extention - increase pain  AROM WFL for UD and RD - also increase pain  Sup and pron about 85   Tender over ulnar side of wrist ,hypothenar eminence ?? TFCC tear  Pain with grip and extention of digits - on ulnar side of wrist  C      skin check done this date  OT Treatments/Exercises (OP) - 04/18/18 0001      Iontophoresis   Type of Iontophoresis  Dexamethasone    Location  ulnar wrist     Dose  small - 1.4 current  and to decrease to 0.6 - cut it short and put ice on wrist and hand last 8 min     Time  27      LUE Contrast Bath  Time  11 minutes    Comments  prior to ROM to decrease pain       Pt able to tolerate ionto this date for whole time - but still only 1.4 current  Pt to see MD next week   recommend MRI to rule out any soft tissue injury - ?? TFCC tear because of pain not improving , and increase with any use or ROM   best pain gets is 5/10        OT Education - 04/18/18 1013    Education Details  use of splint - done ionto and painfree use and ROM     Person(s) Educated  Patient    Methods  Demonstration;Explanation    Comprehension  Verbalized understanding;Returned demonstration       OT Short Term Goals - 04/01/18 1720      OT SHORT TERM GOAL #1   Title   Pt to be ind in HEP and wearing of splint to decrease pain at L wrist to less than 5/10 at the worse     Baseline  pain range from 5-10/10 ;  pain in splint 10/10 -     Time  3    Period  Weeks    Status  New    Target Date  04/22/18      OT SHORT TERM GOAL #2   Title  Pain on PRWHE improve with more than 20 points     Baseline  at eval PRWHE pain score 45/50     Time  3    Period  Weeks    Status  New    Target Date  04/22/18         OT Long Term Goals - 04/01/18 1722      OT LONG TERM GOAL #1   Title  Pt L wrist AROM improve  with more than 20 degrees for flexion , ext - and 5 -10 degrees for others to use in more than 50% of ADL's without increase symptoms     Baseline  see flowsheet - increase pain with all AROM - pain increase to 101/10     Time  4    Period  Weeks    Status  New    Target Date  04/29/18      OT LONG TERM GOAL #2   Title  L wrist strength increase for pt to do do isometric and 1 lbs  in all planes to wean out of splint     Baseline  no strength - decrease AROM - splint to be wear all the time     Time  6    Period  Weeks    Status  New    Target Date  05/13/18      OT LONG TERM GOAL #3   Title  Function score on PRWHE improve with more than 20 points     Baseline  at eval PRWHE function score 34/50     Time  6    Period  Weeks    Status  New    Target Date  05/13/18            Plan - 04/18/18 1014    Clinical Impression Statement  Pt pain still about 5/10 - with wearing custom  splint - did show increase AROM at wrist compare to last time - but tender and pain over ulnar wrist ,  hypethenar eminence of hand - pain increase with flexion, ext, RD,UD-  -  pt to see MD next week - would recommend  MRI - pain do not decrease less than 5 /10 and increase with touch , palpation and any ROM or attempts to use - TFCC tear ??     Occupational performance deficits (Please refer to evaluation for details):  ADL's;IADL's;Work;Play;Leisure    Rehab Potential  Good    OT Frequency  2x / week    OT Duration  4 weeks    OT Treatment/Interventions  Self-care/ADL training;Iontophoresis;Patient/family education;Splinting;Contrast Bath;Ultrasound;Fluidtherapy;Passive range of motion;Manual Therapy    Plan  assess progress with homeprogram - and pain decrease     Clinical Decision Making  Several treatment options, min-mod task modification necessary    OT Home Exercise Plan  see pt instruction     Consulted and Agree with Plan of Care  Patient       Patient will benefit from skilled therapeutic intervention in order to improve the following deficits and impairments:  Pain, Impaired flexibility, Increased edema, Decreased coordination, Decreased strength, Decreased range of motion, Impaired UE functional use, Decreased knowledge of precautions  Visit Diagnosis: Pain in left wrist  Stiffness of left wrist, not elsewhere classified  Muscle weakness (generalized)    Problem List Patient Active Problem List   Diagnosis Date Noted  . S/P laparoscopic assisted vaginal hysterectomy (LAVH) 07/15/2017  . Vitamin D deficiency 02/19/2017  . B12 deficiency 10/22/2016  . Morbid obesity ([place]) 08/05/2016  . Abnormal uterine bleeding 08/05/2016  . History of blood transfusion 08/02/2016  . Iron deficiency anemia 07/11/2016  . Fibroid uterus 08/27/2012    Rosalyn Gess OTR/L,CLT 04/18/2018, 10:41 PM  [facility] [facility] [address] [place], [address] Phone: [phone]   Fax:  [phone]  Name: Sharon Marquez MRN: [id number] Date of Birth: 10-29-1970

## 2018-04-18 NOTE — Patient Instructions (Signed)
Same HEP  

## 2018-04-22 ENCOUNTER — Encounter: Payer: Commercial Managed Care - HMO | Attending: Obstetrics and Gynecology | Admitting: *Deleted

## 2018-04-22 ENCOUNTER — Encounter: Payer: Self-pay | Admitting: *Deleted

## 2018-04-22 ENCOUNTER — Other Ambulatory Visit: Payer: Self-pay | Admitting: Sports Medicine

## 2018-04-22 VITALS — BP 130/90 | Ht 63.0 in | Wt 259.5 lb

## 2018-04-22 DIAGNOSIS — E119 Type 2 diabetes mellitus without complications: Secondary | ICD-10-CM | POA: Diagnosis not present

## 2018-04-22 DIAGNOSIS — Z713 Dietary counseling and surveillance: Secondary | ICD-10-CM | POA: Insufficient documentation

## 2018-04-22 DIAGNOSIS — M25532 Pain in left wrist: Secondary | ICD-10-CM

## 2018-04-22 NOTE — Progress Notes (Signed)
Diabetes Self-Management Education  Visit Type: First/Initial  Appt. Start Time: 1020 Appt. End Time: 1120  04/22/2018  Ms. Sharon Marquez, identified by name and date of birth, is a 47 y.o. female with a diagnosis of Diabetes: Type 2.   ASSESSMENT  Blood pressure 130/90, height 5\' 3"  (1.6 m), weight 259 lb 8 oz (117.7 kg), last menstrual period 07/07/2017. Body mass index is 45.97 kg/m.  Diabetes Self-Management Education - 04/22/18 1140      Visit Information   Visit Type  First/Initial      Initial Visit   Diabetes Type  Type 2    Are you currently following a meal plan?  No    Are you taking your medications as prescribed?  No   hasn't started Metformin yet   Date Diagnosed  1 week      Health Coping   How would you rate your overall health?  Good      Psychosocial Assessment   Patient Belief/Attitude about Diabetes  Motivated to manage diabetes   "I have to do something about it"   Self-care barriers  None    Self-management support  Doctor's office    Patient Concerns  Nutrition/Meal planning;Medication;Monitoring;Healthy Lifestyle;Glycemic Control;Weight Control    Special Needs  None    Preferred Learning Style  Visual;Hands on    Learning Readiness  Ready    How often do you need to have someone help you when you read instructions, pamphlets, or other written materials from your doctor or pharmacy?  1 - Never    What is the last grade level you completed in school?  some college      Pre-Education Assessment   Patient understands the diabetes disease and treatment process.  Needs Instruction    Patient understands incorporating nutritional management into lifestyle.  Needs Instruction    Patient undertands incorporating physical activity into lifestyle.  Needs Instruction    Patient understands using medications safely.  Needs Instruction    Patient understands monitoring blood glucose, interpreting and using results  Needs Instruction    Patient understands  prevention, detection, and treatment of acute complications.  Needs Instruction    Patient understands prevention, detection, and treatment of chronic complications.  Needs Instruction    Patient understands how to develop strategies to address psychosocial issues.  Needs Instruction    Patient understands how to develop strategies to promote health/change behavior.  Needs Instruction      Complications   Last HgB A1C per patient/outside source  6.6 %   04/16/18   How often do you check your blood sugar?  Patient declines   Pt doesn't want to check blood sugars at home. BG in the office today was 124 mg/dL at 11:05 am - fasting.    Have you had a dilated eye exam in the past 12 months?  No    Have you had a dental exam in the past 12 months?  No    Are you checking your feet?  No      Dietary Intake   Breakfast  protein shake    Lunch  [place] or ham sandwich, fruit, chips    Snack (afternoon)  cookies, fruit    Dinner  chicken, pork, fish, beef, bread, potatoes, corn, peas, occasional pasta, greens, cabbage, green beans    Snack (evening)  ice cream    Beverage(s)  fruit juice, water, sugar sweetened tea, soda      Exercise   Exercise Type  Light (walking /  raking leaves)    How many days per week to you exercise?  5    How many minutes per day do you exercise?  120    Total minutes per week of exercise  600      Patient Education   Previous Diabetes Education  No    Disease state   Definition of diabetes, type 1 and 2, and the diagnosis of diabetes    Nutrition management   Role of diet in the treatment of diabetes and the relationship between the three main macronutrients and blood glucose level;Food label reading, portion sizes and measuring food.;Meal timing in regards to the patients' current diabetes medication.    Physical activity and exercise   Role of exercise on diabetes management, blood pressure control and cardiac health.    Medications  Reviewed patients medication for  diabetes, action, purpose, timing of dose and side effects.    Monitoring  Taught/discussed recording of test results and interpretation of SMBG.;Identified appropriate SMBG and/or A1C goals.    Chronic complications  Relationship between chronic complications and blood glucose control;Retinopathy and reason for yearly dilated eye exams    Psychosocial adjustment  Identified and addressed patients feelings and concerns about diabetes      Individualized Goals (developed by patient)   Reducing Risk  Improve blood sugars Decrease medications Prevent diabetes complications Lose weight Lead a healthier lifestyle     Outcomes   Expected Outcomes  Demonstrated interest in learning. Expect positive outcomes    Future DMSE  2 wks       Individualized Plan for Diabetes Self-Management Training:   Learning Objective:  Patient will have a greater understanding of diabetes self-management. Patient education plan is to attend individual and/or group sessions per assessed needs and concerns.   Plan:   Patient Instructions  Exercise: Continue walking for  120  minutes   5 days a week Eat 3 meals day,  1-2 snacks a day Space meals 4-6 hours apart Limit desserts/sweets Avoid sugar sweetened drinks (soda, tea, juices) Make an eye doctor appointment  Expected Outcomes:  Demonstrated interest in learning. Expect positive outcomes  Education material provided:  General Meal Planning Guidelines Simple Meal Plan  If problems or questions, patient to contact team via:  Sharon Marquez, [place], [place], CDE [phone]  Future DSME appointment: 2 wks  May 05, 2018 for Diabetes Class 1

## 2018-04-22 NOTE — Patient Instructions (Signed)
Exercise: Continue walking for  120  minutes   5 days a week  Eat 3 meals day,  1-2 snacks a day Space meals 4-6 hours apart Limit desserts/sweets Avoid sugar sweetened drinks (soda, tea, juices)  Make an eye doctor appointment  Return for classes on:

## 2018-04-23 ENCOUNTER — Ambulatory Visit: Payer: Commercial Managed Care - HMO | Admitting: Occupational Therapy

## 2018-04-23 DIAGNOSIS — M6281 Muscle weakness (generalized): Secondary | ICD-10-CM

## 2018-04-23 DIAGNOSIS — M25532 Pain in left wrist: Secondary | ICD-10-CM

## 2018-04-23 DIAGNOSIS — M25632 Stiffness of left wrist, not elsewhere classified: Secondary | ICD-10-CM

## 2018-04-23 NOTE — Patient Instructions (Signed)
Same  But do UD over edge of chair One of the sessions Opposition to all digits added

## 2018-04-23 NOTE — Therapy (Signed)
[facility] [address] [place], [address] Phone: [phone]   Fax:  [phone]  Occupational Therapy Treatment  Patient Details  Name: Sharon Marquez MRN: [id number] Date of Birth: November 17, 1970 Referring Provider: Candelaria Stagers    Encounter Date: 04/23/2018  OT End of Session - 04/23/18 1446    Visit Number  6    Number of Visits  12    Date for OT Re-Evaluation  05/13/18    OT Start Time  0950    OT Stop Time  1040    OT Time Calculation (min)  50 min    Activity Tolerance  Patient tolerated treatment well    Behavior During Therapy  [facility] for tasks assessed/performed       Past Medical History:  Diagnosis Date  . Anemia   . Blood transfusion without reported diagnosis   . Diabetes mellitus without complication ([place])   . Family history of adverse reaction to anesthesia    "mother has difficulty waking up after surgery ."  . Fibroid uterus 2014  . History of blood transfusion 2014, 2015   for symptomatic anemia  . Vitamin B deficiency     Past Surgical History:  Procedure Laterality Date  . ABDOMINAL HYSTERECTOMY    . CHOLECYSTECTOMY    . LAPAROSCOPIC VAGINAL HYSTERECTOMY WITH SALPINGECTOMY Bilateral 07/15/2017   Procedure: LAPAROSCOPIC ASSISTED VAGINAL HYSTERECTOMY WITH BILATERAL SALPINGECTOMY;  Surgeon: Rubie Maid, MD;  Location: [facility] ORS;  Service: Gynecology;  Laterality: Bilateral;    There were no vitals filed for this visit.  Subjective Assessment - 04/23/18 1003    Subjective   I seen the Dr and going to order MRI - and then follow up - probably severe tendinitis or TFCC tear- light duty not to take to my job     Patient Stated Goals  I want to get my pain better - because I need my hand to work , and in every day activities     Currently in Pain?  Yes    Pain Score  5     Pain Location  Wrist    Pain Orientation  Left    Pain Descriptors / Indicators  Aching;Dull    Pain Onset   More than a month ago    Pain Frequency  Constant         [facility] OT Assessment - 04/23/18 0001      AROM   Left Forearm Supination  85 Degrees    Left Wrist Extension  42 Degrees    Left Wrist Flexion  48 Degrees    Left Wrist Radial Deviation  20 Degrees    Left Wrist Ulnar Deviation  30 Degrees          pt arrive with splint on - pain 5/10  Assess AROM for wrist flexion , extention -cont to have increase pain  AROM WFL for UD and RD - also increase pain   pt to do UD over edge of armrest- to have gravity assist Sup and pron about 85   Tender over ulnar side of wrist ,hypothenar eminence ?? TFCC tear  Pain with grip and extention of digits - on ulnar side of wrist  But making fist with more ease and less pain over posterior hand with opening hand Opposition pain to 5th    Graston tool done over dorsal and volar forearm - sweeping with nr 2 -  and soft tissue thumb webspace  OT Treatments/Exercises (OP) - 04/23/18 0001      Ultrasound   Ultrasound Location  ulnar wrist    Ultrasound Parameters  3.3MHZ, 20%       LUE Contrast Bath   Time  11 minutes    Comments  prior to ROM to decrease pian              OT Education - 04/23/18 1445    Education Details  switch to soft neoprene splint - and HEP until appt with MD     Person(s) Educated  Patient    Methods  Explanation;Demonstration    Comprehension  Verbalized understanding;Returned demonstration       OT Short Term Goals - 04/01/18 1720      OT SHORT TERM GOAL #1   Title   Pt to be ind in HEP and wearing of splint to decrease pain at L wrist to less than 5/10 at the worse     Baseline  pain range from 5-10/10 ;  pain in splint 10/10 -     Time  3    Period  Weeks    Status  New    Target Date  04/22/18      OT SHORT TERM GOAL #2   Title  Pain on PRWHE improve with more than 20 points     Baseline  at eval PRWHE pain score 45/50     Time  3    Period  Weeks    Status  New    Target Date   04/22/18        OT Long Term Goals - 04/01/18 1722      OT LONG TERM GOAL #1   Title  Pt L wrist AROM improve  with more than 20 degrees for flexion , ext - and 5 -10 degrees for others to use in more than 50% of ADL's without increase symptoms     Baseline  see flowsheet - increase pain with all AROM - pain increase to 101/10     Time  4    Period  Weeks    Status  New    Target Date  04/29/18      OT LONG TERM GOAL #2   Title  L wrist strength increase for pt to do do isometric and 1 lbs  in all planes to wean out of splint     Baseline  no strength - decrease AROM - splint to be wear all the time     Time  6    Period  Weeks    Status  New    Target Date  05/13/18      OT LONG TERM GOAL #3   Title  Function score on PRWHE improve with more than 20 points     Baseline  at eval PRWHE function score 34/50     Time  6    Period  Weeks    Status  New    Target Date  05/13/18            Plan - 04/23/18 1447    Clinical Impression Statement  Pt seen MD - ordered MRI - cont to have pain 5/10 at wrist on ulnar side- AROM did improve in flexion , ext -  but pain still impairing her - feels better in splint while using it - do not increase -without splint - pain increase     Occupational performance deficits (Please refer to evaluation for details):  ADL's;IADL's;Work;Play;Leisure    Rehab Potential  Good    OT Frequency  2x / week    OT Duration  4 weeks    OT Treatment/Interventions  Self-care/ADL training;Iontophoresis;Patient/family education;Splinting;Contrast Bath;Ultrasound;Fluidtherapy;Passive range of motion;Manual Therapy    Plan  assess progress with homeprogram - and pain decrease     Clinical Decision Making  Several treatment options, min-mod task modification necessary    OT Home Exercise Plan  see pt instruction    Consulted and Agree with Plan of Care  Patient       Patient will benefit from skilled therapeutic intervention in order to improve the  following deficits and impairments:  Pain, Impaired flexibility, Increased edema, Decreased coordination, Decreased strength, Decreased range of motion, Impaired UE functional use, Decreased knowledge of precautions  Visit Diagnosis: Pain in left wrist  Stiffness of left wrist, not elsewhere classified  Muscle weakness (generalized)    Problem List Patient Active Problem List   Diagnosis Date Noted  . S/P laparoscopic assisted vaginal hysterectomy (LAVH) 07/15/2017  . Vitamin D deficiency 02/19/2017  . B12 deficiency 10/22/2016  . Morbid obesity ([place]) 08/05/2016  . Abnormal uterine bleeding 08/05/2016  . History of blood transfusion 08/02/2016  . Iron deficiency anemia 07/11/2016  . Fibroid uterus 08/27/2012    Rosalyn Gess OTR/L,CLT 04/23/2018, 2:52 PM  [facility] [address] [place], [address] Phone: [phone]   Fax:  [phone]  Name: Sharon Marquez MRN: [id number] Date of Birth: 24-May-1971

## 2018-04-25 ENCOUNTER — Ambulatory Visit: Payer: Commercial Managed Care - HMO | Admitting: Occupational Therapy

## 2018-04-25 DIAGNOSIS — M25532 Pain in left wrist: Secondary | ICD-10-CM | POA: Diagnosis not present

## 2018-04-25 DIAGNOSIS — M25632 Stiffness of left wrist, not elsewhere classified: Secondary | ICD-10-CM

## 2018-04-25 DIAGNOSIS — M6281 Muscle weakness (generalized): Secondary | ICD-10-CM

## 2018-04-25 NOTE — Therapy (Signed)
[facility] [address] [place], [address] Phone: [phone]   Fax:  [phone]  Occupational Therapy Treatment  Patient Details  Name: Sharon Marquez MRN: [id number] Date of Birth: Mar 05, 1971 Referring Provider: Candelaria Stagers    Encounter Date: 04/25/2018  OT End of Session - 04/25/18 1036    Visit Number  7    Number of Visits  12    Date for OT Re-Evaluation  05/13/18    OT Start Time  1016    OT Stop Time  1045    OT Time Calculation (min)  29 min    Activity Tolerance  Patient tolerated treatment well    Behavior During Therapy  [facility] for tasks assessed/performed       Past Medical History:  Diagnosis Date  . Anemia   . Blood transfusion without reported diagnosis   . Diabetes mellitus without complication ([place])   . Family history of adverse reaction to anesthesia    "mother has difficulty waking up after surgery ."  . Fibroid uterus 2014  . History of blood transfusion 2014, 2015   for symptomatic anemia  . Vitamin B deficiency     Past Surgical History:  Procedure Laterality Date  . ABDOMINAL HYSTERECTOMY    . CHOLECYSTECTOMY    . LAPAROSCOPIC VAGINAL HYSTERECTOMY WITH SALPINGECTOMY Bilateral 07/15/2017   Procedure: LAPAROSCOPIC ASSISTED VAGINAL HYSTERECTOMY WITH BILATERAL SALPINGECTOMY;  Surgeon: Rubie Maid, MD;  Location: [facility] OR[facility];  Service: Gynecology;  Laterality: Bilateral;    There were no vitals filed for this visit.  Subjective Assessment - 04/25/18 1032    Subjective   Doing okay - I am having my MRI on the 13th - do I need to make appt for that next week with the Dr - that touching fingers - the pinkie really hurts bad     Patient Stated Goals  I want to get my pain better - because I need my hand to work , and in every day activities     Currently in Pain?  Yes    Pain Score  5     Pain Location  Wrist    Pain Orientation  Left    Pain Descriptors / Indicators  Aching    Pain Type  Acute pain        AROM of L wrist - pt maintaining and improving in some - but increase pain with AROM - and with opposition to 5th   pt to hold off on 5th             OT Treatments/Exercises (OP) - 04/25/18 0001      LUE Fluidotherapy   Number Minutes Fluidotherapy  11 Minutes    LUE Fluidotherapy Location  Hand;Wrist    Comments  alternate with ice 2 x - increase motion witout increase pain         Graston tool done over dorsal and volar forearm - sweeping with nr 2 -  and soft tissue  Provided splint with new straps and new tubigrip       OT Education - 04/25/18 1036    Education Details  cont hard splint and maintain or increase ROM     Person(s) Educated  Patient    Methods  Explanation;Demonstration    Comprehension  Verbalized understanding;Returned demonstration       OT Short Term Goals - 04/01/18 1720      OT SHORT TERM GOAL #1   Title  Pt to be ind in HEP and wearing of splint to decrease pain at L wrist to less than 5/10 at the worse     Baseline  pain range from 5-10/10 ;  pain in splint 10/10 -     Time  3    Period  Weeks    Status  New    Target Date  04/22/18      OT SHORT TERM GOAL #2   Title  Pain on PRWHE improve with more than 20 points     Baseline  at eval PRWHE pain score 45/50     Time  3    Period  Weeks    Status  New    Target Date  04/22/18        OT Long Term Goals - 04/01/18 1722      OT LONG TERM GOAL #1   Title  Pt L wrist AROM improve  with more than 20 degrees for flexion , ext - and 5 -10 degrees for others to use in more than 50% of ADL's without increase symptoms     Baseline  see flowsheet - increase pain with all AROM - pain increase to 101/10     Time  4    Period  Weeks    Status  New    Target Date  04/29/18      OT LONG TERM GOAL #2   Title  L wrist strength increase for pt to do do isometric and 1 lbs  in all planes to wean out of splint     Baseline  no strength - decrease AROM -  splint to be wear all the time     Time  6    Period  Weeks    Status  New    Target Date  05/13/18      OT LONG TERM GOAL #3   Title  Function score on PRWHE improve with more than 20 points     Baseline  at eval PRWHE function score 34/50     Time  6    Period  Weeks    Status  New    Target Date  05/13/18            Plan - 04/25/18 1054    Clinical Impression Statement  Cont to have 5/10 pain at the best- with any modalities and wrist AROM - pt show increase pain on ulnar side of wrist- increase pain with opposition to 5th- pt to hold off on that one -and per pt MRI scheduled for Friday 13th Sept-     Occupational performance deficits (Please refer to evaluation for details):  ADL's;IADL's;Work;Play;Leisure    Rehab Potential  Good    OT Frequency  1x / week    OT Duration  4 weeks    OT Treatment/Interventions  Self-care/ADL training;Iontophoresis;Patient/family education;Splinting;Contrast Bath;Ultrasound;Fluidtherapy;Passive range of motion;Manual Therapy    Plan  assess progress with homeprogram - and pain decrease     Clinical Decision Making  Several treatment options, min-mod task modification necessary    OT Home Exercise Plan  see pt instruction    Consulted and Agree with Plan of Care  Patient       Patient will benefit from skilled therapeutic intervention in order to improve the following deficits and impairments:  Pain, Impaired flexibility, Increased edema, Decreased coordination, Decreased strength, Decreased range of motion, Impaired UE functional use, Decreased knowledge of precautions  Visit Diagnosis: Pain in left wrist  Stiffness of left wrist, not elsewhere classified  Muscle weakness (generalized)    Problem List Patient Active Problem List   Diagnosis Date Noted  . S/P laparoscopic assisted vaginal hysterectomy (LAVH) 07/15/2017  . Vitamin D deficiency 02/19/2017  . B12 deficiency 10/22/2016  . Morbid obesity ([place]) 08/05/2016  . Abnormal  uterine bleeding 08/05/2016  . History of blood transfusion 08/02/2016  . Iron deficiency anemia 07/11/2016  . Fibroid uterus 08/27/2012    Rosalyn Gess OTR/L,CLT 04/25/2018, 10:56 AM  [facility] [address], [place], [address] Phone: [phone]   Fax:  [phone]  Name: KAFI DOTTER MRN: [id number] Date of Birth: February 20, 1971

## 2018-04-25 NOTE — Patient Instructions (Signed)
Same HEP  But stop opposition to 5th

## 2018-05-02 ENCOUNTER — Ambulatory Visit: Payer: Commercial Managed Care - HMO | Admitting: Occupational Therapy

## 2018-05-05 ENCOUNTER — Encounter: Payer: Self-pay | Admitting: Dietician

## 2018-05-05 ENCOUNTER — Ambulatory Visit: Payer: 59

## 2018-05-05 NOTE — Progress Notes (Signed)
Pt cancelled class 1 tonight-she is having tests done and possible surgery-pt will call back to reschedule after surgery is done if she has it

## 2018-05-08 ENCOUNTER — Ambulatory Visit
Admission: RE | Admit: 2018-05-08 | Discharge: 2018-05-08 | Disposition: A | Discharge status: Home / Self Care | Payer: Commercial Managed Care - HMO | Source: Ambulatory Visit | Attending: Sports Medicine | Admitting: Sports Medicine

## 2018-05-08 ENCOUNTER — Ambulatory Visit: Payer: 59

## 2018-05-08 ENCOUNTER — Ambulatory Visit: Payer: Commercial Managed Care - HMO | Attending: Sports Medicine | Admitting: Occupational Therapy

## 2018-05-08 DIAGNOSIS — M25532 Pain in left wrist: Secondary | ICD-10-CM

## 2018-05-08 DIAGNOSIS — G8929 Other chronic pain: Secondary | ICD-10-CM | POA: Insufficient documentation

## 2018-05-08 DIAGNOSIS — M6281 Muscle weakness (generalized): Secondary | ICD-10-CM

## 2018-05-08 DIAGNOSIS — M25632 Stiffness of left wrist, not elsewhere classified: Secondary | ICD-10-CM

## 2018-05-08 MED ORDER — LIDOCAINE HCL (PF) 1 % IJ SOLN
5.0000 mL | Freq: Once | INTRAMUSCULAR | Status: AC
  Administered 2018-05-08: 5 mL
  Filled 2018-05-08: qty 5

## 2018-05-08 MED ORDER — SODIUM CHLORIDE 0.9 % IJ SOLN
10.0000 mL | Freq: Once | INTRAMUSCULAR | Status: AC
  Administered 2018-05-08: 10 mL

## 2018-05-08 MED ORDER — IOPAMIDOL (ISOVUE-300) INJECTION 61%
7.5000 mL | Freq: Once | INTRAVENOUS | Status: AC | PRN
  Administered 2018-05-08: 7.5 mL

## 2018-05-08 MED ORDER — GADOBENATE DIMEGLUMINE 529 MG/ML IV SOLN
0.1000 mL | Freq: Once | INTRAVENOUS | Status: AC | PRN
  Administered 2018-05-08: 0.1 mL via INTRA_ARTICULAR

## 2018-05-08 NOTE — Patient Instructions (Signed)
Cont contrast and AROM without increase pain  And can add gentle isometric for flexion, ext, RD, UD - neutral -2 x 5 reps  without increase ain  Can use contrast to decrease pain again

## 2018-05-08 NOTE — Therapy (Signed)
[facility] [address] [place], [address] Phone: [phone]   Fax:  [phone]  Occupational Therapy Treatment  Patient Details  Name: Sharon Marquez MRN: [id number] Date of Birth: 1971/05/20 Referring Provider: Candelaria Stagers    Encounter Date: 05/08/2018  OT End of Session - 05/08/18 1000    Visit Number  8    Number of Visits  12    Date for OT Re-Evaluation  05/13/18    OT Start Time  0930    OT Stop Time  1000    OT Time Calculation (min)  30 min    Activity Tolerance  Patient tolerated treatment well    Behavior During Therapy  [facility] for tasks assessed/performed       Past Medical History:  Diagnosis Date  . Anemia   . Blood transfusion without reported diagnosis   . Diabetes mellitus without complication ([place])   . Family history of adverse reaction to anesthesia    "mother has difficulty waking up after surgery ."  . Fibroid uterus 2014  . History of blood transfusion 2014, 2015   for symptomatic anemia  . Vitamin B deficiency     Past Surgical History:  Procedure Laterality Date  . ABDOMINAL HYSTERECTOMY    . CHOLECYSTECTOMY    . LAPAROSCOPIC VAGINAL HYSTERECTOMY WITH SALPINGECTOMY Bilateral 07/15/2017   Procedure: LAPAROSCOPIC ASSISTED VAGINAL HYSTERECTOMY WITH BILATERAL SALPINGECTOMY;  Surgeon: Rubie Maid, MD;  Location: [facility];  Service: Gynecology;  Laterality: Bilateral;    There were no vitals filed for this visit.  Subjective Assessment - 05/08/18 0932    Subjective   I think my motion is better but pain stays same hand having MRI today after I leave here - not taking any meds     Patient Stated Goals  I want to get my pain better - because I need my hand to work , and in every day activities     Currently in Pain?  Yes    Pain Score  5     Pain Location  Wrist    Pain Orientation  Left    Pain Descriptors / Indicators  Aching    Pain Type  Acute pain    Pain Onset  More  than a month ago    Pain Frequency  Constant         [facility] OT Assessment - 05/08/18 0001      AROM   Left Wrist Extension  58 Degrees    Left Wrist Flexion  65 Degrees    Left Wrist Radial Deviation  26 Degrees    Left Wrist Ulnar Deviation  42 Degrees        assess AROM - great progress in ROM - but pain do increase and still same on ulnar side of wrist - but pain decrease with contrast and splint wearing to 5/10    done in clinic and add isometric strengthening for flexion, ext, RD,UD - neutral position   5 reps each   x 2 MRI moved up to this am     OT Treatments/Exercises (OP) - 05/08/18 0001      LUE Contrast Bath   Time  11 minutes    Comments  end of session to decrease pain after motion              OT Education - 05/08/18 0959    Education Details  gentle isometric added     Person(s) Educated  Patient    Methods  Explanation;Demonstration    Comprehension  Verbalized understanding;Returned demonstration       OT Short Term Goals - 05/08/18 1001      OT SHORT TERM GOAL #1   Title   Pt to be ind in HEP and wearing of splint to decrease pain at L wrist to less than 5/10 at the worse     Baseline  pain 5/10 in the splint     Status  Achieved      OT SHORT TERM GOAL #2   Title  Pain on PRWHE improve with more than 20 points     Baseline  at eval PRWHE pain score 45/50 - pain still increase more than 5/10 out of splint     Time  3    Period  Weeks    Status  On-going    Target Date  05/15/18        OT Long Term Goals - 05/08/18 1003      OT LONG TERM GOAL #1   Title  Pt L wrist AROM improve  with more than 20 degrees for flexion , ext - and 5 -10 degrees for others to use in more than 50% of ADL's without increase symptoms     Baseline  AROM for wrist increase in all planes - but pain cont 5-increase with AROM -     Time  3    Period  Weeks    Status  On-going    Target Date  05/15/18      OT LONG TERM GOAL #2   Title  L wrist strength  increase for pt to do do isometric and 1 lbs  in all planes to wean out of splint     Baseline  initiated strength this date- AROM increase - isometric - but cannot wean out of splint - pain more than 5/10 out of splint     Time  3    Period  Weeks    Status  On-going    Target Date  05/13/18      OT LONG TERM GOAL #3   Title  Function score on PRWHE improve with more than 20 points     Baseline  at eval PRWHE function score 34/50 - still impaired because of pain     Time  2    Period  Weeks    Status  On-going    Target Date  05/13/18            Plan - 05/08/18 1000    Clinical Impression Statement  Pt pain same about 5/10 - but great progress in AROM for wrist - did add gentle isometric for pt - pt schedule for contrast MRI this am and then appt with MD next Wed     Occupational performance deficits (Please refer to evaluation for details):  ADL's;IADL's;Work;Play;Leisure    Rehab Potential  Fair    Current Impairments/barriers affecting progress:  ? TFCC tear    OT Frequency  1x / week    OT Treatment/Interventions  Self-care/ADL training;Iontophoresis;Patient/family education;Splinting;Contrast Bath;Ultrasound;Fluidtherapy;Passive range of motion;Manual Therapy    Plan  assess progress with homeprogram - and pain decrease     Clinical Decision Making  Several treatment options, min-mod task modification necessary    OT Home Exercise Plan  see pt instruction    Consulted and Agree with Plan of Care  Patient       Patient will benefit from skilled therapeutic intervention  in order to improve the following deficits and impairments:  Pain, Impaired flexibility, Increased edema, Decreased coordination, Decreased strength, Decreased range of motion, Impaired UE functional use, Decreased knowledge of precautions  Visit Diagnosis: Pain in left wrist  Stiffness of left wrist, not elsewhere classified  Muscle weakness (generalized)    Problem List Patient Active Problem List    Diagnosis Date Noted  . S/P laparoscopic assisted vaginal hysterectomy (LAVH) 07/15/2017  . Vitamin D deficiency 02/19/2017  . B12 deficiency 10/22/2016  . Morbid obesity ([place]) 08/05/2016  . Abnormal uterine bleeding 08/05/2016  . History of blood transfusion 08/02/2016  . Iron deficiency anemia 07/11/2016  . Fibroid uterus 08/27/2012    Rosalyn Gess OTR/L,CLT 05/08/2018, 10:14 AM  [facility] [address] [place], [address] Phone: [phone]   Fax:  [phone]  Name: Sharon Marquez MRN: [id number] Date of Birth: 1971-03-22

## 2018-05-09 ENCOUNTER — Other Ambulatory Visit: Payer: 59

## 2018-05-09 ENCOUNTER — Ambulatory Visit: Payer: 59

## 2018-05-12 ENCOUNTER — Ambulatory Visit: Payer: 59 | Admitting: Obstetrics and Gynecology

## 2018-05-12 ENCOUNTER — Ambulatory Visit: Payer: 59

## 2018-05-15 ENCOUNTER — Ambulatory Visit: Payer: Commercial Managed Care - HMO | Admitting: Occupational Therapy

## 2018-05-15 DIAGNOSIS — M25632 Stiffness of left wrist, not elsewhere classified: Secondary | ICD-10-CM | POA: Diagnosis present

## 2018-05-15 DIAGNOSIS — M25532 Pain in left wrist: Secondary | ICD-10-CM | POA: Insufficient documentation

## 2018-05-15 DIAGNOSIS — M6281 Muscle weakness (generalized): Secondary | ICD-10-CM | POA: Insufficient documentation

## 2018-05-15 NOTE — Therapy (Signed)
[facility] [address] [place], [address] Phone: [phone]   Fax:  [phone]  Occupational Therapy Treatment  Patient Details  Name: Sharon Marquez MRN: [id number] Date of Birth: 04/06/1971 Referring Provider: Candelaria Stagers    Encounter Date: 05/15/2018  OT End of Session - 05/15/18 1131    Visit Number  9    Number of Visits  9    Date for OT Re-Evaluation  05/15/18    OT Start Time  1025    OT Stop Time  1050    OT Time Calculation (min)  25 min    Activity Tolerance  Patient tolerated treatment well    Behavior During Therapy  [facility] for tasks assessed/performed       Past Medical History:  Diagnosis Date  . Anemia   . Blood transfusion without reported diagnosis   . Diabetes mellitus without complication ([place])   . Family history of adverse reaction to anesthesia    "mother has difficulty waking up after surgery ."  . Fibroid uterus 2014  . History of blood transfusion 2014, 2015   for symptomatic anemia  . Vitamin B deficiency     Past Surgical History:  Procedure Laterality Date  . ABDOMINAL HYSTERECTOMY    . CHOLECYSTECTOMY    . LAPAROSCOPIC VAGINAL HYSTERECTOMY WITH SALPINGECTOMY Bilateral 07/15/2017   Procedure: LAPAROSCOPIC ASSISTED VAGINAL HYSTERECTOMY WITH BILATERAL SALPINGECTOMY;  Surgeon: Rubie Maid, MD;  Location: [facility] ORS;  Service: Gynecology;  Laterality: Bilateral;    There were no vitals filed for this visit.  Subjective Assessment - 05/15/18 1130    Subjective   I had the MRI done -and seen the Dr - I am going to see as hand specialist just when I am done seeing you - I brought the paper for you to look at -and need some new straps for my splint     Patient Stated Goals  I want to get my pain better - because I need my hand to work , and in every day activities     Currently in Pain?  Yes    Pain Score  5     Pain Location  Wrist    Pain Orientation  Left    Pain  Descriptors / Indicators  Aching    Pain Type  Acute pain    Pain Onset  More than a month ago    Pain Frequency  Constant         [facility] OT Assessment - 05/15/18 0001      AROM   Left Forearm Supination  85 Degrees    Left Wrist Extension  60 Degrees    Left Wrist Flexion  48 Degrees    Left Wrist Radial Deviation  30 Degrees    Left Wrist Ulnar Deviation  42 Degrees        Pt arrrive with her paper to take to Dr Amedeo Plenty - needed some new straps and tubigrip for splint - replaced  AROM same except wrist flexion decrease - pain stay about 5/10 with splint   increase with use or ROM outside of splint  Discuss her results of MRI -and to see surgeon and will go from there                OT Education - 05/15/18 1131    Education Details  Pt to see hand surgeon     Person(s) Educated  Patient    Methods  Explanation    Comprehension  Verbalized understanding       OT Short Term Goals - 05/15/18 1133      OT SHORT TERM GOAL #1   Title   Pt to be ind in HEP and wearing of splint to decrease pain at L wrist to less than 5/10 at the worse     Baseline  pain 5/10 in the splint     Status  Achieved      OT SHORT TERM GOAL #2   Title  Pain on PRWHE improve with more than 20 points     Baseline  at eval PRWHE pain score 45/50 - pain still increase more than 5/10 out of splint     Status  Not Met        OT Long Term Goals - 05/15/18 1134      OT LONG TERM GOAL #1   Title  Pt L wrist AROM improve  with more than 20 degrees for flexion , ext - and 5 -10 degrees for others to use in more than 50% of ADL's without increase symptoms     Baseline  AROM for wrist increase in all planes - but pain cont 5-increase with AROM -     Status  Not Met      OT LONG TERM GOAL #2   Title  L wrist strength increase for pt to do do isometric and 1 lbs  in all planes to wean out of splint     Status  Not Met      OT LONG TERM GOAL #3   Title  Function score on PRWHE improve with  more than 20 points     Baseline  at eval PRWHE function score 34/50 - still impaired because of pain     Status  Not Met            Plan - 05/15/18 1132    Clinical Impression Statement  Reivew with pt results of MRI -and AROM cont to be about same - did loose some wrist flexoin -and pain do not decrease less than 5/10 - replace her straps- and discuss she need to see surgeon - and then will go from there     Plan  to see hand surgeon this am     Clinical Decision Making  Several treatment options, min-mod task modification necessary    OT Home Exercise Plan  see pt instruction       Patient will benefit from skilled therapeutic intervention in order to improve the following deficits and impairments:     Visit Diagnosis: Pain in left wrist - Plan: Ot plan of care cert/re-cert  Stiffness of left wrist, not elsewhere classified - Plan: Ot plan of care cert/re-cert  Muscle weakness (generalized) - Plan: Ot plan of care cert/re-cert    Problem List Patient Active Problem List   Diagnosis Date Noted  . S/P laparoscopic assisted vaginal hysterectomy (LAVH) 07/15/2017  . Vitamin D deficiency 02/19/2017  . B12 deficiency 10/22/2016  . Morbid obesity ([place]) 08/05/2016  . Abnormal uterine bleeding 08/05/2016  . History of blood transfusion 08/02/2016  . Iron deficiency anemia 07/11/2016  . Fibroid uterus 08/27/2012    Rosalyn Gess OTR/l,CLT 05/15/2018, 12:44 PM  [facility] [address] [place], [address] Phone: [phone]   Fax:  [phone]  Name: Sharon Marquez MRN: [id number] Date of Birth: 1971/08/03

## 2018-05-19 ENCOUNTER — Ambulatory Visit: Payer: 59

## 2018-05-30 ENCOUNTER — Encounter (HOSPITAL_BASED_OUTPATIENT_CLINIC_OR_DEPARTMENT_OTHER): Payer: Self-pay | Admitting: *Deleted

## 2018-05-30 ENCOUNTER — Other Ambulatory Visit: Payer: Self-pay

## 2018-06-03 ENCOUNTER — Encounter: Payer: Self-pay | Admitting: *Deleted

## 2018-06-03 ENCOUNTER — Encounter (HOSPITAL_BASED_OUTPATIENT_CLINIC_OR_DEPARTMENT_OTHER)
Admission: RE | Admit: 2018-06-03 | Discharge: 2018-06-03 | Disposition: A | Discharge status: Home / Self Care | Payer: 59 | Source: Ambulatory Visit | Attending: Orthopedic Surgery | Admitting: Orthopedic Surgery

## 2018-06-03 DIAGNOSIS — Z01818 Encounter for other preprocedural examination: Secondary | ICD-10-CM | POA: Insufficient documentation

## 2018-06-03 LAB — BASIC METABOLIC PANEL
ANION GAP: 7 (ref 5–15)
BUN: 8 mg/dL (ref 6–20)
CALCIUM: 8.7 mg/dL — AB (ref 8.9–10.3)
CO2: 27 mmol/L (ref 22–32)
Chloride: 105 mmol/L (ref 98–111)
Creatinine, Ser: 0.57 mg/dL (ref 0.44–1.00)
GFR calc Af Amer: >60 mL/min (ref >60)
GFR calc non Af Amer: >60 mL/min (ref >60)
GLUCOSE: 105 mg/dL — AB (ref 70–99)
Potassium: 4.5 mmol/L (ref 3.5–5.1)
Sodium: 139 mmol/L (ref 135–145)

## 2018-06-03 NOTE — Progress Notes (Signed)
EKG and BMI reviewed by Dr. Fransisco Beau, will proceed with surgery as scheduled.

## 2018-06-05 ENCOUNTER — Other Ambulatory Visit: Payer: Self-pay | Admitting: Orthopedic Surgery

## 2018-06-06 ENCOUNTER — Ambulatory Visit (HOSPITAL_BASED_OUTPATIENT_CLINIC_OR_DEPARTMENT_OTHER): Payer: Commercial Managed Care - HMO | Admitting: Certified Registered"

## 2018-06-06 ENCOUNTER — Encounter (HOSPITAL_BASED_OUTPATIENT_CLINIC_OR_DEPARTMENT_OTHER): Payer: Self-pay | Admitting: Certified Registered"

## 2018-06-06 ENCOUNTER — Other Ambulatory Visit: Payer: Self-pay

## 2018-06-06 ENCOUNTER — Encounter (HOSPITAL_BASED_OUTPATIENT_CLINIC_OR_DEPARTMENT_OTHER)
Admission: RE | Disposition: A | Discharge status: Home / Self Care | Payer: Self-pay | Source: Ambulatory Visit | Attending: Orthopedic Surgery

## 2018-06-06 ENCOUNTER — Ambulatory Visit (HOSPITAL_BASED_OUTPATIENT_CLINIC_OR_DEPARTMENT_OTHER)
Admission: RE | Admit: 2018-06-06 | Discharge: 2018-06-06 | Disposition: A | Discharge status: Home / Self Care | Payer: Commercial Managed Care - HMO | Source: Ambulatory Visit | Attending: Orthopedic Surgery | Admitting: Orthopedic Surgery

## 2018-06-06 DIAGNOSIS — M65831 Other synovitis and tenosynovitis, right forearm: Secondary | ICD-10-CM | POA: Insufficient documentation

## 2018-06-06 DIAGNOSIS — X58XXXA Exposure to other specified factors, initial encounter: Secondary | ICD-10-CM | POA: Insufficient documentation

## 2018-06-06 DIAGNOSIS — M25532 Pain in left wrist: Secondary | ICD-10-CM | POA: Insufficient documentation

## 2018-06-06 DIAGNOSIS — S63092A Other subluxation of left wrist and hand, initial encounter: Secondary | ICD-10-CM | POA: Diagnosis present

## 2018-06-06 DIAGNOSIS — Z7984 Long term (current) use of oral hypoglycemic drugs: Secondary | ICD-10-CM | POA: Diagnosis not present

## 2018-06-06 DIAGNOSIS — Z6841 Body Mass Index (BMI) 40.0 and over, adult: Secondary | ICD-10-CM | POA: Insufficient documentation

## 2018-06-06 DIAGNOSIS — E119 Type 2 diabetes mellitus without complications: Secondary | ICD-10-CM | POA: Diagnosis not present

## 2018-06-06 DIAGNOSIS — S63511A Sprain of carpal joint of right wrist, initial encounter: Secondary | ICD-10-CM | POA: Insufficient documentation

## 2018-06-06 HISTORY — PX: TENDON TRANSFER: SHX6109

## 2018-06-06 HISTORY — DX: Synovitis and tenosynovitis, unspecified: M65.9

## 2018-06-06 HISTORY — PX: WRIST ARTHROSCOPY: SHX838

## 2018-06-06 HISTORY — DX: Unspecified synovitis and tenosynovitis, unspecified forearm: M65.939

## 2018-06-06 LAB — GLUCOSE, CAPILLARY
GLUCOSE-CAPILLARY: 105 mg/dL — AB (ref 70–99)
Glucose-Capillary: 110 mg/dL — ABNORMAL HIGH (ref 70–99)

## 2018-06-06 LAB — POCT HEMOGLOBIN-HEMACUE: Hemoglobin: 13.6 g/dL (ref 12.0–15.0)

## 2018-06-06 SURGERY — ARTHROSCOPY, WRIST
Anesthesia: Monitor Anesthesia Care | Site: Wrist | Laterality: Left

## 2018-06-06 MED ORDER — ONDANSETRON HCL 4 MG/2ML IJ SOLN
INTRAMUSCULAR | Status: DC | PRN
  Administered 2018-06-06: 4 mg via INTRAVENOUS

## 2018-06-06 MED ORDER — PROPOFOL 500 MG/50ML IV EMUL
INTRAVENOUS | Status: DC | PRN
  Administered 2018-06-06: 75 ug/kg/min via INTRAVENOUS

## 2018-06-06 MED ORDER — HYDROMORPHONE HCL 1 MG/ML IJ SOLN
INTRAMUSCULAR | Status: AC
  Filled 2018-06-06: qty 0.5

## 2018-06-06 MED ORDER — OXYCODONE HCL 5 MG PO TABS
ORAL_TABLET | ORAL | Status: AC
  Filled 2018-06-06: qty 1

## 2018-06-06 MED ORDER — DEXTROSE 5 % IV SOLN
3.0000 g | INTRAVENOUS | Status: AC
  Administered 2018-06-06: 3 g via INTRAVENOUS

## 2018-06-06 MED ORDER — BUPIVACAINE-EPINEPHRINE (PF) 0.5% -1:200000 IJ SOLN
INTRAMUSCULAR | Status: DC | PRN
  Administered 2018-06-06: 30 mL via PERINEURAL

## 2018-06-06 MED ORDER — LIDOCAINE HCL (CARDIAC) PF 100 MG/5ML IV SOSY
PREFILLED_SYRINGE | INTRAVENOUS | Status: DC | PRN
  Administered 2018-06-06: 30 mg via INTRAVENOUS

## 2018-06-06 MED ORDER — GLYCOPYRROLATE 0.2 MG/ML IJ SOLN
INTRAMUSCULAR | Status: DC | PRN
  Administered 2018-06-06: 0.2 mg via INTRAVENOUS

## 2018-06-06 MED ORDER — HYDROMORPHONE HCL 1 MG/ML IJ SOLN
0.2500 mg | INTRAMUSCULAR | Status: DC | PRN
  Administered 2018-06-06 (×2): 0.25 mg via INTRAVENOUS

## 2018-06-06 MED ORDER — SCOPOLAMINE 1 MG/3DAYS TD PT72: 1.0000 | MEDICATED_PATCH | Freq: Once | TRANSDERMAL | Status: DC | PRN

## 2018-06-06 MED ORDER — LACTATED RINGERS IV SOLN
INTRAVENOUS | Status: DC
  Administered 2018-06-06: 13:00:00 via INTRAVENOUS

## 2018-06-06 MED ORDER — BUPIVACAINE HCL (PF) 0.25 % IJ SOLN
INTRAMUSCULAR | Status: AC
  Filled 2018-06-06: qty 30

## 2018-06-06 MED ORDER — MIDAZOLAM HCL 2 MG/2ML IJ SOLN
1.0000 mg | INTRAMUSCULAR | Status: DC | PRN
  Administered 2018-06-06: 2 mg via INTRAVENOUS

## 2018-06-06 MED ORDER — MIDAZOLAM HCL 2 MG/2ML IJ SOLN
INTRAMUSCULAR | Status: AC
  Filled 2018-06-06: qty 2

## 2018-06-06 MED ORDER — FENTANYL CITRATE (PF) 100 MCG/2ML IJ SOLN
INTRAMUSCULAR | Status: AC
  Filled 2018-06-06: qty 2

## 2018-06-06 MED ORDER — FENTANYL CITRATE (PF) 100 MCG/2ML IJ SOLN
50.0000 ug | INTRAMUSCULAR | Status: DC | PRN
  Administered 2018-06-06: 100 ug via INTRAVENOUS

## 2018-06-06 MED ORDER — BUPIVACAINE HCL (PF) 0.5 % IJ SOLN
INTRAMUSCULAR | Status: AC
  Filled 2018-06-06: qty 30

## 2018-06-06 MED ORDER — OXYCODONE HCL 5 MG PO TABS
5.0000 mg | ORAL_TABLET | ORAL | Status: DC | PRN
  Administered 2018-06-06: 5 mg via ORAL

## 2018-06-06 MED ORDER — CHLORHEXIDINE GLUCONATE 4 % EX LIQD: 60.0000 mL | Freq: Once | CUTANEOUS | Status: DC

## 2018-06-06 MED ORDER — CEFAZOLIN SODIUM-DEXTROSE 1-4 GM/50ML-% IV SOLN
INTRAVENOUS | Status: AC
  Filled 2018-06-06: qty 50

## 2018-06-06 SURGICAL SUPPLY — 88 items
BANDAGE ACE 3X5.8 VEL STRL LF (GAUZE/BANDAGES/DRESSINGS) ×3 IMPLANT
BANDAGE ACE 4X5 VEL STRL LF (GAUZE/BANDAGES/DRESSINGS) ×3 IMPLANT
BANDAGE COBAN STERILE 2 (GAUZE/BANDAGES/DRESSINGS) ×3 IMPLANT
BLADE CUDA 2.0 (BLADE) IMPLANT
BLADE SURG 15 STRL LF DISP TIS (BLADE) ×1 IMPLANT
BLADE SURG 15 STRL SS (BLADE) ×2
BNDG COHESIVE 3X5 TAN STRL LF (GAUZE/BANDAGES/DRESSINGS) ×3 IMPLANT
BNDG CONFORM 3 STRL LF (GAUZE/BANDAGES/DRESSINGS) ×3 IMPLANT
BNDG GAUZE ELAST 4 BULKY (GAUZE/BANDAGES/DRESSINGS) ×3 IMPLANT
BRUSH SCRUB EZ PLAIN DRY (MISCELLANEOUS) ×3 IMPLANT
BUR FULL RADIUS 2.0 (BURR) ×2 IMPLANT
BUR FULL RADIUS 2.0MM (BURR) ×1
CLOSURE WOUND 1/2 X4 (GAUZE/BANDAGES/DRESSINGS) ×1
CORD BIPOLAR FORCEPS 12FT (ELECTRODE) ×3 IMPLANT
COVER BACK TABLE 60X90IN (DRAPES) ×3 IMPLANT
COVER WAND RF STERILE (DRAPES) IMPLANT
CUFF TOURNIQUET SINGLE 18IN (TOURNIQUET CUFF) IMPLANT
CUFF TOURNIQUET SINGLE 24IN (TOURNIQUET CUFF) ×3 IMPLANT
DECANTER SPIKE VIAL GLASS SM (MISCELLANEOUS) IMPLANT
DRAPE EXTREMITY T 121X128X90 (DRAPE) ×3 IMPLANT
DRAPE OEC MINIVIEW 54X84 (DRAPES) IMPLANT
DRAPE SURG 17X23 STRL (DRAPES) ×3 IMPLANT
DRSG EMULSION OIL 3X3 NADH (GAUZE/BANDAGES/DRESSINGS) ×3 IMPLANT
GAUZE SPONGE 4X4 12PLY STRL LF (GAUZE/BANDAGES/DRESSINGS) IMPLANT
GAUZE XEROFORM 1X8 LF (GAUZE/BANDAGES/DRESSINGS) IMPLANT
GAUZE XEROFORM 5X9 LF (GAUZE/BANDAGES/DRESSINGS) ×3 IMPLANT
GLOVE BIO SURGEON STRL SZ 6.5 (GLOVE) ×2 IMPLANT
GLOVE BIO SURGEON STRL SZ8 (GLOVE) ×3 IMPLANT
GLOVE BIO SURGEONS STRL SZ 6.5 (GLOVE) ×1
GLOVE BIOGEL M STRL SZ7.5 (GLOVE) IMPLANT
GLOVE BIOGEL PI IND STRL 7.0 (GLOVE) ×1 IMPLANT
GLOVE BIOGEL PI INDICATOR 7.0 (GLOVE) ×2
GLOVE SS BIOGEL STRL SZ 8 (GLOVE) ×1 IMPLANT
GLOVE SUPERSENSE BIOGEL SZ 8 (GLOVE) ×2
GOWN STRL REUS W/ TWL LRG LVL3 (GOWN DISPOSABLE) ×1 IMPLANT
GOWN STRL REUS W/TWL LRG LVL3 (GOWN DISPOSABLE) ×2
IV NS IRRIG 3000ML ARTHROMATIC (IV SOLUTION) ×3 IMPLANT
IV SET EXT 30 76VOL 4 MALE LL (IV SETS) ×3 IMPLANT
NDL SUT 6 .5 CRC .975X.05 MAYO (NEEDLE) IMPLANT
NEEDLE HYPO 22GX1.5 SAFETY (NEEDLE) ×3 IMPLANT
NEEDLE HYPO 25X1 1.5 SAFETY (NEEDLE) IMPLANT
NEEDLE MAYO 6 CRC TAPER PT (NEEDLE) IMPLANT
NEEDLE MAYO TAPER (NEEDLE)
NS IRRIG 1000ML POUR BTL (IV SOLUTION) IMPLANT
PACK BASIN DAY SURGERY FS (CUSTOM PROCEDURE TRAY) ×3 IMPLANT
PAD CAST 3X4 CTTN HI CHSV (CAST SUPPLIES) ×1 IMPLANT
PAD CAST 4YDX4 CTTN HI CHSV (CAST SUPPLIES) ×1 IMPLANT
PADDING CAST ABS 4INX4YD NS (CAST SUPPLIES)
PADDING CAST ABS COTTON 4X4 ST (CAST SUPPLIES) IMPLANT
PADDING CAST COTTON 3X4 STRL (CAST SUPPLIES) ×2
PADDING CAST COTTON 4X4 STRL (CAST SUPPLIES) ×2
PASSER SUT SWANSON 36MM LOOP (INSTRUMENTS) IMPLANT
PROBE BIPOLAR ARTHRO 85MM 30D (MISCELLANEOUS) ×3 IMPLANT
SET SM JOINT TUBING/CANN (CANNULA) ×3 IMPLANT
SHEET MEDIUM DRAPE 40X70 STRL (DRAPES) ×3 IMPLANT
SLEEVE SCD COMPRESS KNEE MED (MISCELLANEOUS) ×3 IMPLANT
SLING ARM FOAM STRAP LRG (SOFTGOODS) ×3 IMPLANT
SLING ARM MED ADULT FOAM STRAP (SOFTGOODS) IMPLANT
SPLINT FAST PLASTER 5X30 (CAST SUPPLIES)
SPLINT FIBERGLASS 3X35 (CAST SUPPLIES) IMPLANT
SPLINT FIBERGLASS 4X30 (CAST SUPPLIES) ×3 IMPLANT
SPLINT PLASTER CAST FAST 5X30 (CAST SUPPLIES) IMPLANT
STOCKINETTE 4X48 STRL (DRAPES) ×3 IMPLANT
STOCKINETTE SYNTHETIC 3 UNSTER (CAST SUPPLIES) ×3 IMPLANT
STRIP CLOSURE SKIN 1/2X4 (GAUZE/BANDAGES/DRESSINGS) ×2 IMPLANT
SUT ETHIBOND 3-0 V-5 (SUTURE) IMPLANT
SUT FIBERWIRE 3-0 18 TAPR NDL (SUTURE) ×3
SUT FIBERWIRE 4-0 18 DIAM BLUE (SUTURE)
SUT PDS AB 2-0 CT2 27 (SUTURE) IMPLANT
SUT PROLENE 3 0 PS 2 (SUTURE) IMPLANT
SUT PROLENE 4 0 P 3 18 (SUTURE) IMPLANT
SUT PROLENE 4 0 PS 2 18 (SUTURE) ×6 IMPLANT
SUT VIC AB 3-0 FS2 27 (SUTURE) IMPLANT
SUTURE FIBERWR 3-0 18 TAPR NDL (SUTURE) ×1 IMPLANT
SUTURE FIBERWR 4-0 18 DIA BLUE (SUTURE) IMPLANT
SYR BULB 3OZ (MISCELLANEOUS) IMPLANT
SYR CONTROL 10ML LL (SYRINGE) ×3 IMPLANT
TAPE SURG TRANSPORE 1 IN (GAUZE/BANDAGES/DRESSINGS) ×1 IMPLANT
TAPE SURGICAL TRANSPORE 1 IN (GAUZE/BANDAGES/DRESSINGS) ×2
TOWEL GREEN STERILE FF (TOWEL DISPOSABLE) ×3 IMPLANT
TOWEL OR NON WOVEN STRL DISP B (DISPOSABLE) ×3 IMPLANT
TRAP DIGIT (INSTRUMENTS) IMPLANT
TRAP FINGER LRG (INSTRUMENTS) ×3 IMPLANT
TRAY DSU PREP LF (CUSTOM PROCEDURE TRAY) ×3 IMPLANT
TUBE CONNECTING 20'X1/4 (TUBING) ×1
TUBE CONNECTING 20X1/4 (TUBING) ×2 IMPLANT
UNDERPAD 30X30 (UNDERPADS AND DIAPERS) ×3 IMPLANT
WATER STERILE IRR 1000ML POUR (IV SOLUTION) ×3 IMPLANT

## 2018-06-06 NOTE — Anesthesia Postprocedure Evaluation (Signed)
Anesthesia Post Note  Patient: [name]  Procedure(s) Performed: Left wrist arthroscopy with synovectomy (Left Wrist) Repair / Reconstruction, Open Extensor Carpi Ulnaris Debridement Repair and Stabilization (Left Wrist)     Patient location during evaluation: PACU Anesthesia Type: Regional and MAC Level of consciousness: awake and alert Pain management: pain level controlled Vital Signs Assessment: post-procedure vital signs reviewed and stable Respiratory status: spontaneous breathing, nonlabored ventilation and respiratory function stable Cardiovascular status: stable and blood pressure returned to baseline Postop Assessment: no apparent nausea or vomiting Anesthetic complications: no    Last Vitals:  Vitals:   06/06/18 1600 06/06/18 1615  BP: (!) 160/104 (!) 160/75  Pulse: 76 71  Resp: 20 17  Temp:    SpO2: 100% 99%    Last Pain:  Vitals:   06/06/18 1615  TempSrc:   PainSc: 4                  Abijah Roussel,W. EDMOND

## 2018-06-06 NOTE — Discharge Instructions (Signed)
Oxycodone 5mg  given at 4:30, next dose due 8:30   Post Anesthesia Home Care Instructions  Activity: Get plenty of rest for the remainder of the day. A responsible individual must stay with you for 24 hours following the procedure.  For the next 24 hours, DO NOT: -Drive a car -Paediatric nurse -Drink alcoholic beverages -Take any medication unless instructed by your physician -Make any legal decisions or sign important papers.  Meals: Start with liquid foods such as gelatin or soup. Progress to regular foods as tolerated. Avoid greasy, spicy, heavy foods. If nausea and/or vomiting occur, drink only clear liquids until the nausea and/or vomiting subsides. Call your physician if vomiting continues.  Special Instructions/Symptoms: Your throat may feel dry or sore from the anesthesia or the breathing tube placed in your throat during surgery. If this causes discomfort, gargle with warm salt water. The discomfort should disappear within 24 hours.  If you had a scopolamine patch placed behind your ear for the management of post- operative nausea and/or vomiting:  1. The medication in the patch is effective for 72 hours, after which it should be removed.  Wrap patch in a tissue and discard in the trash. Wash hands thoroughly with soap and water. 2. You may remove the patch earlier than 72 hours if you experience unpleasant side effects which may include dry mouth, dizziness or visual disturbances. 3. Avoid touching the patch. Wash your hands with soap and water after contact with the patch.   Keep bandage clean and dry.  Call for any problems.  No smoking.  Criteria for driving a car: you should be off your pain medicine for 7-8 hours, able to drive one handed(confident), thinking clearly and feeling able in your judgement to drive. Continue elevation as it will decrease swelling.  If instructed by MD move your fingers within the confines of the bandage/splint.  Use ice if instructed by your MD.  Call immediately for any sudden loss of feeling in your hand/arm or change in functional abilities of the extremity.We recommend that you to take vitamin C 1000 mg a day to promote healing. We also recommend that if you require  pain medicine that you take a stool softener to prevent constipation as most pain medicines will have constipation side effects. We recommend either Peri-Colace or Senokot and recommend that you also consider adding MiraLAX as well to prevent the constipation affects from pain medicine if you are required to use them. These medicines are over the counter and may be purchased at a local pharmacy. A cup of yogurt and a probiotic can also be helpful during the recovery process as the medicines can disrupt your intestinal environment.

## 2018-06-06 NOTE — Anesthesia Procedure Notes (Signed)
Anesthesia Regional Block: Supraclavicular block   Pre-Anesthetic Checklist: ,, timeout performed, Correct Patient, Correct Site, Correct Laterality, Correct Procedure, Correct Position, site marked, Risks and benefits discussed, pre-op evaluation,  At surgeon's request and post-op pain management  Laterality: Left  Prep: Maximum Sterile Barrier Precautions used, Betadine       Needles:  Injection technique: Single-shot  Needle Type: Echogenic Stimulator Needle     Needle Length: 10cm  Needle Gauge: 21     Additional Needles:   Procedures:,,,, ultrasound used (permanent image in chart),,,,  Narrative:  Start time: 06/06/2018 1:38 PM End time: 06/06/2018 1:48 PM Injection made incrementally with aspirations every 5 mL. Anesthesiologist: Roderic Palau, MD  Additional Notes: 2% Lidocaine skin wheel.

## 2018-06-06 NOTE — Transfer of Care (Signed)
Immediate Anesthesia Transfer of Care Note  Patient: Sharon Marquez  Procedure(s) Performed: Left wrist arthroscopy with synovectomy and repair/reconstruction, open extensor carpi ulnaris debridement repair and stabilization (Left Wrist)  Patient Location: PACU  Anesthesia Type:General  Level of Consciousness: awake, alert  and oriented  Airway & Oxygen Therapy: Patient Spontanous Breathing and Patient connected to face mask oxygen  Post-op Assessment: Report given to RN and Post -op Vital signs reviewed and stable  Post vital signs: Reviewed and stable  Last Vitals:  Vitals Value Taken Time  BP 137/86 06/06/2018  3:41 PM  Temp    Pulse 87 06/06/2018  3:42 PM  Resp 17 06/06/2018  3:42 PM  SpO2 100 % 06/06/2018  3:42 PM  Vitals shown include unvalidated device data.  Last Pain:  Vitals:   06/06/18 1312  TempSrc: Oral  PainSc: 7          Complications: No apparent anesthesia complications

## 2018-06-06 NOTE — Op Note (Signed)
See full dictation#[id number] Sharon Burks MD

## 2018-06-06 NOTE — H&P (Signed)
Sharon Marquez is an 47 y.o. female.   Chief Complaint: left wrist pain HPI: Patient presents for evaluation and treatment of the of their upper extremity predicament. The patient denies neck, back, chest or  abdominal pain. The patient notes that they have no lower extremity problems. The patients primary complaint is noted. We are planning surgical care pathway for the upper extremity.  Past Medical History:  Diagnosis Date  . Anemia   . Blood transfusion without reported diagnosis   . Diabetes mellitus without complication ([place])   . Family history of adverse reaction to anesthesia    "mother has difficulty waking up after surgery ."  . Fibroid uterus 2014  . History of blood transfusion 2014, 2015   for symptomatic anemia  . Synovitis of wrist    left  . Vitamin B deficiency     Past Surgical History:  Procedure Laterality Date  . ABDOMINAL HYSTERECTOMY    . CHOLECYSTECTOMY    . LAPAROSCOPIC VAGINAL HYSTERECTOMY WITH SALPINGECTOMY Bilateral 07/15/2017   Procedure: LAPAROSCOPIC ASSISTED VAGINAL HYSTERECTOMY WITH BILATERAL SALPINGECTOMY;  Surgeon: Rubie Maid, MD;  Location: [facility] ORS;  Service: Gynecology;  Laterality: Bilateral;    Family History  Problem Relation Age of Onset  . Hyperthyroidism Mother   . Diabetes Mother   . Hypertension Mother   . Hyperthyroidism Maternal Aunt   . Diabetes Maternal Grandmother   . Diabetes Maternal Uncle   . Diabetes Maternal Grandfather    Social History:  reports that she has never smoked. She has never used smokeless tobacco. She reports that she does not drink alcohol or use drugs.  Allergies:  Allergies  Allergen Reactions  . Meloxicam Itching  . Tramadol Itching and Swelling    Medications Prior to Admission  Medication Sig Dispense Refill  . ibuprofen (ADVIL,MOTRIN) 800 MG tablet TAKE 1 TABLET BY MOUTH EVERY 8 HOURS AS NEEDED 180 tablet 0  . metFORMIN (GLUCOPHAGE) 500 MG tablet Take 1 tablet (500 mg total) by mouth 2  (two) times daily with a meal. 60 tablet 6  . Cetirizine HCl (ZYRTEC ALLERGY) 10 MG CAPS Take 1 capsule (10 mg total) by mouth daily. (Patient taking differently: Take 1 capsule by mouth daily as needed. ) 30 capsule 11  . cyanocobalamin (,VITAMIN B-12,) 1000 MCG/ML injection Inject 1 mL (1,000 mcg total) into the muscle every 30 (thirty) days. 10 mL 1  . phentermine (ADIPEX-P) 37.5 MG tablet Take 1 tablet (37.5 mg total) by mouth daily before breakfast. 30 tablet 2  . Vitamin D, Ergocalciferol, (DRISDOL) 50000 units CAPS capsule Take 1 capsule (50,000 Units total) by mouth 2 (two) times a week. 24 capsule 3    Results for orders placed or performed during the hospital encounter of 06/06/18 (from the past 48 hour(s))  Glucose, capillary     Status: Abnormal   Collection Time: 06/06/18  1:17 PM  Result Value Ref Range   Glucose-Capillary 105 (H) 70 - 99 mg/dL  Hemoglobin-hemacue, POC     Status: None   Collection Time: 06/06/18  1:18 PM  Result Value Ref Range   Hemoglobin 13.6 12.0 - 15.0 g/dL   No results found.  Review of Systems  Respiratory: Negative.   Cardiovascular: Negative.   Gastrointestinal: Negative.   Genitourinary: Negative.     Blood pressure 133/74, pulse 78, temperature 97.7 F (36.5 C), temperature source Oral, resp. rate (!) 23, height 5\' 3"  (1.6 m), weight 121.4 kg, last menstrual period 07/07/2017, SpO2 100 %. Physical Exam  The patient is alert and oriented in no acute distress. The patient complains of pain in the affected upper extremity.  The patient is noted to have a normal HEENT exam. Lung fields show equal chest expansion and no shortness of breath. Abdomen exam is nontender without distention. Lower extremity examination does not show any fracture dislocation or blood clot symptoms. Pelvis is stable and the neck and back are stable and nontender.  Left wrist synovitis with ECU subluxation and pain    Assessment/Plan Plan for Ascope left wrist  with ECU stabilization sling tenosynovectomy  We are planning surgery for your upper extremity. The risk and benefits of surgery to include risk of bleeding, infection, anesthesia,  damage to normal structures and failure of the surgery to accomplish its intended goals of relieving symptoms and restoring function have been discussed in detail. With this in mind we plan to proceed. I have specifically discussed with the patient the pre-and postoperative regime and the dos and don'ts and risk and benefits in great detail. Risk and benefits of surgery also include risk of dystrophy(CRPS), chronic nerve pain, failure of the healing process to go onto completion and other inherent risks of surgery The relavent the pathophysiology of the disease/injury process, as well as the alternatives for treatment and postoperative course of action has been discussed in great detail with the patient who desires to proceed.  We will do everything in our power to help you (the patient) restore function to the upper extremity. It is a pleasure to see this patient today.   Willa Frater III, MD 06/06/2018, 2:09 PM

## 2018-06-06 NOTE — Anesthesia Preprocedure Evaluation (Addendum)
Anesthesia Evaluation  Patient identified by MRN, date of birth, ID band Patient awake    Reviewed: Allergy & Precautions, H&P , NPO status , Patient's Chart, lab work & pertinent test results  Airway Mallampati: II  TM Distance: >3 FB Neck ROM: Full    Dental no notable dental hx. (+) Teeth Intact, Dental Advisory Given   Pulmonary neg pulmonary ROS,    Pulmonary exam normal breath sounds clear to auscultation       Cardiovascular negative cardio ROS   Rhythm:Regular Rate:Normal     Neuro/Psych negative neurological ROS  negative psych ROS   GI/Hepatic negative GI ROS, Neg liver ROS,   Endo/Other  diabetes, Type 2, Oral Hypoglycemic AgentsMorbid obesity  Renal/GU negative Renal ROS  negative genitourinary   Musculoskeletal  (+) Arthritis , Osteoarthritis,    Abdominal   Peds  Hematology  (+) Blood dyscrasia, anemia ,   Anesthesia Other Findings   Reproductive/Obstetrics negative OB ROS                            Anesthesia Physical Anesthesia Plan  ASA: III  Anesthesia Plan: MAC and Regional   Post-op Pain Management:    Induction: Intravenous  PONV Risk Score and Plan: 2 and Propofol infusion, Midazolam and Ondansetron  Airway Management Planned: Simple Face Mask  Additional Equipment:   Intra-op Plan:   Post-operative Plan:   Informed Consent: I have reviewed the patients History and Physical, chart, labs and discussed the procedure including the risks, benefits and alternatives for the proposed anesthesia with the patient or authorized representative who has indicated his/her understanding and acceptance.   Dental advisory given  Plan Discussed with: CRNA  Anesthesia Plan Comments:         Anesthesia Quick Evaluation

## 2018-06-06 NOTE — Op Note (Signed)
NAME: Sharon Marquez, Sharon Marquez MEDICAL RECORD [id number] ACCOUNT [id number] DATE OF BIRTH:1971/03/17 FACILITY: [facility] LOCATION: [facility] PHYSICIAN:Lennin Osmond M. 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  PROCEDURE: 1.  Arthroscopy left wrist with synovectomy and TFC debridement of a non-destabilizing tear as well as debridement of invaginated and impinging capsular tissue.  This was done through an arthroscopic approach. 2.  Extensor carpi ulnaris tenolysis, tenosynovectomy, and sling/stabilization technique with retinacular repair utilizing FiberWire fixation.  SURGEON:  Roseanne Kaufman, MD  ASSISTANT:  None.  COMPLICATIONS:  None.  ANESTHESIA:  Block with IV sedation.  TOURNIQUET TIME:  Less than an hour.  INDICATIONS:  A 47 year old female with the above-mentioned diagnosis.  I have counseled her in regard to risks and benefits of surgery, and she desires to proceed.  OPERATIVE PROCEDURE:  The patient was seen by myself and anesthesia and taken to the operative theater.  Underwent a Hibiclens prescrub followed by a 10-minute surgical Betadine scrub and paint.  The block was in excellent working fashion, and thus we  performed this operation under a block anesthetic with IV sedation.  She tolerated this well.  Once this was complete, the patient then underwent a very careful and cautious approach to the extremity with placement of a wrist traction tower.  Ten pounds  of countertraction was applied through the apparatus, and the patient then underwent placement of a 3-4 portal followed by 6U outflow and a 6R working portal.  The patient had significant synovitis in the pre-styloid recess and ulnar aspect of the wrist  which was debrided with arthroscopic shaver.  She  had invaginated capsular tissue, which we cleaned up with the arthroscopic shaver and thermal ablator.  This was a synovectomy, and I noted that the periphery of her TFC was intact.  The central portion  was intact.  She had some synovitis and invaginated capsular tissue radially, and thus the camera and arthroscopic shaver and ablator were switched in terms of their locations.  Synovectomy arthrotomy was accomplished here as well.  Following this, I  redemonstrated competency of the scaphoid, lunate, scapholunate, interosseous ligament, triquetrum, and the LT ligament as well as TFC once again.  I was pleased with this.  There was no advanced arthritis.  The area had a nice synovectomy, and all  invaginated capsular tissue was debrided.  At this time, the scope was removed.  Incision was made about the ulnar aspect of the wrist.  Dissection was carried down.  Skin flap was elevated.  Superficial branch of the dorsal sensory ulnar nerve was carefully protected.  Following this, the  patient then underwent a look at the [place].  It had marked synovitis and tenosynovitis around it.  It was decompressed.  I then opened the retinaculum volar to the ECU and swept it dorsally for an ECU stabilization procedure.  She had a subsheath tear  about the ECU, and the patient had marked subluxation.  I thus placed the retinacular sling underneath the TCU followed by wrapping it on top and securing it with FiberWire.  It was carried just adjacent to the 5th extensor compartment, which I  demonstrated good gliding and made sure we did not damage this.  The patient tolerated this well.  Following this, we then irrigated copiously.  I checked the 4th and 5th  compartments as well as the 6th, and all looked well.  The ECU was stabilized  nicely.  There were no complicating features.  Following the ECU stabilization, I then placed her in a neutral position, closed the wound with Prolene, and irrigated prior to closure.   Hemostasis was excellent.  Compartments were soft.  Tourniquet time was less than an hour.  A long-arm splint applied, taken to recovery room.  She will be discharged home on appropriate pain medicine in the form of oxycodone and Robaxin for spasm as well as Keflex p.r.n. antibiotic prophylaxis.  These  notes have been discussed and all questions have been encouraged and answered.  It was a pleasure to see her today and participate in her care plan.  Should any problems arise, she will notify me.  LN/NUANCE  D:06/06/2018 T:06/06/2018 JOB:[id number]

## 2018-06-06 NOTE — Progress Notes (Signed)
Assisted Dr. Oren Bracket with left, ultrasound guided, supraclavicular block. Side rails up, monitors on throughout procedure. See vital signs in flow sheet. Tolerated Procedure well.

## 2018-06-06 NOTE — Anesthesia Procedure Notes (Signed)
Procedure Name: MAC Date/Time: 06/06/2018 2:28 PM Performed by: Signe Colt, CRNA Pre-anesthesia Checklist: Patient identified, Emergency Drugs available, Suction available, Patient being monitored and Timeout performed Patient Re-evaluated:Patient Re-evaluated prior to induction Oxygen Delivery Method: Simple face mask

## 2018-06-10 ENCOUNTER — Encounter (HOSPITAL_BASED_OUTPATIENT_CLINIC_OR_DEPARTMENT_OTHER): Payer: Self-pay | Admitting: Orthopedic Surgery

## 2018-06-13 ENCOUNTER — Other Ambulatory Visit: Payer: Self-pay | Admitting: *Deleted

## 2018-06-13 DIAGNOSIS — D509 Iron deficiency anemia, unspecified: Secondary | ICD-10-CM

## 2018-06-18 ENCOUNTER — Inpatient Hospital Stay: Payer: 59 | Attending: Oncology

## 2018-06-18 DIAGNOSIS — Z79899 Other long term (current) drug therapy: Secondary | ICD-10-CM | POA: Diagnosis not present

## 2018-06-18 DIAGNOSIS — E538 Deficiency of other specified B group vitamins: Secondary | ICD-10-CM | POA: Diagnosis not present

## 2018-06-18 DIAGNOSIS — Z7984 Long term (current) use of oral hypoglycemic drugs: Secondary | ICD-10-CM | POA: Diagnosis not present

## 2018-06-18 DIAGNOSIS — D500 Iron deficiency anemia secondary to blood loss (chronic): Secondary | ICD-10-CM | POA: Diagnosis not present

## 2018-06-18 DIAGNOSIS — E119 Type 2 diabetes mellitus without complications: Secondary | ICD-10-CM | POA: Insufficient documentation

## 2018-06-18 DIAGNOSIS — N920 Excessive and frequent menstruation with regular cycle: Secondary | ICD-10-CM | POA: Diagnosis not present

## 2018-06-18 DIAGNOSIS — D509 Iron deficiency anemia, unspecified: Secondary | ICD-10-CM

## 2018-06-18 LAB — CBC WITH DIFFERENTIAL/PLATELET
Abs Immature Granulocytes: 0 10*3/uL (ref 0.00–0.07)
Basophils Absolute: 0 10*3/uL (ref 0.0–0.1)
Basophils Relative: 1 %
EOS PCT: 1 %
Eosinophils Absolute: 0 10*3/uL (ref 0.0–0.5)
HEMATOCRIT: 39.5 % (ref 36.0–46.0)
HEMOGLOBIN: 12.5 g/dL (ref 12.0–15.0)
Immature Granulocytes: 0 %
LYMPHS PCT: 46 %
Lymphs Abs: 1.8 10*3/uL (ref 0.7–4.0)
MCH: 26.2 pg (ref 26.0–34.0)
MCHC: 31.6 g/dL (ref 30.0–36.0)
MCV: 82.8 fL (ref 80.0–100.0)
Monocytes Absolute: 0.5 10*3/uL (ref 0.1–1.0)
Monocytes Relative: 11 %
Neutro Abs: 1.6 10*3/uL — ABNORMAL LOW (ref 1.7–7.7)
Neutrophils Relative %: 41 %
Platelets: 259 10*3/uL (ref 150–400)
RBC: 4.77 MIL/uL (ref 3.87–5.11)
RDW: 13.2 % (ref 11.5–15.5)
WBC: 4 10*3/uL (ref 4.0–10.5)
nRBC: 0 % (ref 0.0–0.2)

## 2018-06-18 LAB — FERRITIN: Ferritin: 48 ng/mL (ref 11–307)

## 2018-06-18 LAB — IRON AND TIBC
IRON: 57 ug/dL (ref 28–170)
Saturation Ratios: 16 % (ref 10.4–31.8)
TIBC: 351 ug/dL (ref 250–450)
UIBC: 294 ug/dL

## 2018-06-23 NOTE — Progress Notes (Signed)
[place]  [phone] [phone]  ID: [name] OB: 06-18-1971  MR#: [id number]  ATF#:[id number]  Patient Care Team: Patient, No Pcp Per as PCP - General (General Practice)  CHIEF COMPLAINT: Iron deficiency anemia, B-12 deficiency.  INTERVAL HISTORY: Patient returns to clinic today for repeat laboratory work and further evaluation.  She has increased fatigue recently, but otherwise feels well.  She has no neurologic complaints. She denies any recent fevers or illnesses. She has a good appetite and denies weight loss. She has no chest pain or shortness of breath. She denies any nausea, vomiting, constipation, or diarrhea. She has no melena or hematochezia. She has no urinary complaints.  Patient offers no further specific complaints today.    REVIEW OF SYSTEMS:   Review of Systems  Constitutional: Positive for malaise/fatigue. Negative for fever and weight loss.  Respiratory: Negative.  Negative for cough and shortness of breath.   Cardiovascular: Negative.  Negative for chest pain and leg swelling.  Gastrointestinal: Negative.  Negative for abdominal pain, blood in stool and melena.  Genitourinary: Negative.  Negative for hematuria.  Musculoskeletal: Negative.  Negative for back pain.  Skin: Negative.  Negative for rash.  Neurological: Negative.  Negative for focal weakness, weakness and headaches.  Psychiatric/Behavioral: Negative.  The patient is not nervous/anxious.     As per HPI. Otherwise, a complete review of systems is negative.  PAST MEDICAL HISTORY: Past Medical History:  Diagnosis Date  . Anemia   . Blood transfusion without reported diagnosis   . Diabetes mellitus without complication ([place])   . Family history of adverse reaction to anesthesia    "mother has difficulty waking up after surgery ."  . Fibroid uterus 2014  . History of blood transfusion 2014, 2015   for symptomatic anemia  . Synovitis of wrist    left  .  Vitamin B deficiency     PAST SURGICAL HISTORY: Past Surgical History:  Procedure Laterality Date  . ABDOMINAL HYSTERECTOMY    . CHOLECYSTECTOMY    . LAPAROSCOPIC VAGINAL HYSTERECTOMY WITH SALPINGECTOMY Bilateral 07/15/2017   Procedure: LAPAROSCOPIC ASSISTED VAGINAL HYSTERECTOMY WITH BILATERAL SALPINGECTOMY;  Surgeon: Rubie Maid, MD;  Location: [facility];  Service: Gynecology;  Laterality: Bilateral;  . TENDON TRANSFER Left 06/06/2018   Procedure: Repair / Reconstruction, Open Extensor Carpi Ulnaris Debridement Repair and Stabilization;  Surgeon: Roseanne Kaufman, MD;  Location: [place];  Service: Orthopedics;  Laterality: Left;  . WRIST ARTHROSCOPY Left 06/06/2018   Procedure: Left wrist arthroscopy with synovectomy;  Surgeon: Roseanne Kaufman, MD;  Location: [place];  Service: Orthopedics;  Laterality: Left;  90 mins    FAMILY HISTORY: Family History  Problem Relation Age of Onset  . Hyperthyroidism Mother   . Diabetes Mother   . Hypertension Mother   . Hyperthyroidism Maternal Aunt   . Diabetes Maternal Grandmother   . Diabetes Maternal Uncle   . Diabetes Maternal Grandfather     ADVANCED DIRECTIVES (Y/N):  N  HEALTH MAINTENANCE: Social History   Tobacco Use  . Smoking status: Never Smoker  . Smokeless tobacco: Never Used  Substance Use Topics  . Alcohol use: No  . Drug use: No     Colonoscopy:  PAP:  Bone density:  Lipid panel:  Allergies  Allergen Reactions  . Meloxicam Itching  . Tramadol Itching and Swelling    Current Outpatient Medications  Medication Sig Dispense Refill  . Cetirizine HCl (ZYRTEC ALLERGY) 10 MG CAPS Take 1 capsule (  10 mg total) by mouth daily. (Patient taking differently: Take 1 capsule by mouth daily as needed. ) 30 capsule 11  . cyanocobalamin (,VITAMIN B-12,) 1000 MCG/ML injection Inject 1 mL (1,000 mcg total) into the muscle every 30 (thirty) days. 10 mL 1  . ibuprofen (ADVIL,MOTRIN) 800 MG  tablet TAKE 1 TABLET BY MOUTH EVERY 8 HOURS AS NEEDED 180 tablet 0  . metFORMIN (GLUCOPHAGE) 500 MG tablet Take 1 tablet (500 mg total) by mouth 2 (two) times daily with a meal. 60 tablet 6  . OXYCODONE HCL PO Take 5 mg by mouth every 4 (four) hours as needed.    . phentermine (ADIPEX-P) 37.5 MG tablet Take 1 tablet (37.5 mg total) by mouth daily before breakfast. 30 tablet 2  . Vitamin D, Ergocalciferol, (DRISDOL) 50000 units CAPS capsule Take 1 capsule (50,000 Units total) by mouth 2 (two) times a week. 24 capsule 3   No current facility-administered medications for this visit.     OBJECTIVE: Vitals:   06/25/18 1352 06/25/18 1402  BP:  125/78  Pulse:  69  Resp: 16   Temp:  98.2 F (36.8 C)     Body mass index is 48.54 kg/m.    ECOG FS:0 - Asymptomatic  General: Well-developed, well-nourished, no acute distress. Eyes: Pink conjunctiva, anicteric sclera. HEENT: Normocephalic, moist mucous membranes. Lungs: Clear to auscultation bilaterally. Heart: Regular rate and rhythm. No rubs, murmurs, or gallops. Abdomen: Soft, nontender, nondistended. No organomegaly noted, normoactive bowel sounds. Musculoskeletal: No edema, cyanosis, or clubbing. Neuro: Alert, answering all questions appropriately. Cranial nerves grossly intact. Skin: No rashes or petechiae noted. Psych: Normal affect.  LAB RESULTS:  Lab Results  Component Value Date   NA 139 06/03/2018   K 4.5 06/03/2018   CL 105 06/03/2018   CO2 27 06/03/2018   GLUCOSE 105 (H) 06/03/2018   BUN 8 06/03/2018   CREATININE 0.57 06/03/2018   CALCIUM 8.7 (L) 06/03/2018   PROT 6.7 04/16/2018   ALBUMIN 3.8 04/16/2018   AST 14 04/16/2018   ALT 20 04/16/2018   ALKPHOS 66 04/16/2018   BILITOT 0.2 04/16/2018   GFRNONAA >60 06/03/2018   GFRAA >60 06/03/2018    Lab Results  Component Value Date   WBC 4.0 06/18/2018   NEUTROABS 1.6 (L) 06/18/2018   HGB 12.5 06/18/2018   HCT 39.5 06/18/2018   MCV 82.8 06/18/2018   PLT 259  06/18/2018   Lab Results  Component Value Date   IRON 57 06/18/2018   TIBC 351 06/18/2018   IRONPCTSAT 16 06/18/2018   Lab Results  Component Value Date   FERRITIN 48 06/18/2018     STUDIES: No results found.  ASSESSMENT: Iron deficiency anemia, B-12 deficiency.  PLAN:    1. Iron deficiency anemia: Patient's hemoglobin and iron stores continue to be within normal limits.  She does not require additional IV Feraheme today.  Now that she has a hysterectomy, her heavy menses have subsided and she may not require as frequent infusions.  Her last treatment occurred on November 04, 2017.  Return to clinic in 6 months with repeat laboratory work and further evaluation.  2. B-12 deficiency: Patient last received B12 on March 17, 2018.  Monitor.   3.  Heavy menses: Resolved.  Patient had total hysterectomy in November 2018.  Continue follow-up with OB/GYN as indicated.  Patient expressed understanding and was in agreement with this plan. She also understands that She can call clinic at any time with any questions, concerns, or complaints.  Lloyd Huger, MD   06/27/2018 3:24 PM

## 2018-06-25 ENCOUNTER — Inpatient Hospital Stay (HOSPITAL_BASED_OUTPATIENT_CLINIC_OR_DEPARTMENT_OTHER): Payer: 59 | Admitting: Oncology

## 2018-06-25 ENCOUNTER — Inpatient Hospital Stay: Payer: 59

## 2018-06-25 ENCOUNTER — Encounter: Payer: Self-pay | Admitting: Oncology

## 2018-06-25 ENCOUNTER — Other Ambulatory Visit: Payer: Self-pay

## 2018-06-25 VITALS — BP 125/78 | HR 69 | Temp 98.2°F | Resp 16 | Ht 63.0 in | Wt 274.0 lb

## 2018-06-25 DIAGNOSIS — Z79899 Other long term (current) drug therapy: Secondary | ICD-10-CM

## 2018-06-25 DIAGNOSIS — D500 Iron deficiency anemia secondary to blood loss (chronic): Secondary | ICD-10-CM

## 2018-06-25 DIAGNOSIS — Z7984 Long term (current) use of oral hypoglycemic drugs: Secondary | ICD-10-CM

## 2018-06-25 DIAGNOSIS — E538 Deficiency of other specified B group vitamins: Secondary | ICD-10-CM

## 2018-06-25 DIAGNOSIS — N920 Excessive and frequent menstruation with regular cycle: Secondary | ICD-10-CM

## 2018-06-25 DIAGNOSIS — Z119 Encounter for screening for infectious and parasitic diseases, unspecified: Secondary | ICD-10-CM

## 2018-06-25 NOTE — Progress Notes (Signed)
Patient here for follow up. She states she has been more tired lately. She has a significant wt. Gain since injuring her arm.

## 2018-06-30 NOTE — Progress Notes (Deleted)
Name: Sharon Marquez  MRN/ DOB: [id number], [birth year]   Age/ Sex: 47 y.o., female    PCP: Lorelle Gibbs, CNM  Reason for Endocrinology Evaluation: Type 2 Diabetes Mellitus     Date of Initial Endocrinology Visit: 07/01/2018     PATIENT IDENTIFIER: Sharon Marquez is a 47 y.o. female with a past medical history of T2DM, iron deficiency anemia . The patient presented for initial ndocrinology clinic visit on 07/01/2018 for consultative assistance with her diabetes management.    HPI: Sharon Marquez was    Diagnosed with DM *** Prior Medications tried: *** Hypoglycemia episodes : ***               Symptoms: ***                 Frequency: ***/  Hemoglobin A1c has ranged from *** in ***, peaking at *** in***. Currently checking blood sugars *** x / day,  before breakfast and ***.  Patient required assistance for hypoglycemia:  Patient has required hospitalization within the last 1 year from hyper or hypoglycemia:   In terms of diet, the patient ***   HOME DIABETES REGIMEN: Basal: ***  Bolus: ***   Statin: {Yes/No:11203} ACE-I/ARB: {YES/NO:17245} Prior Diabetic Education: {Yes/No:11203}   METER DOWNLOAD SUMMARY: Date range evaluated: *** Fingerstick Blood Glucose Tests = *** Average Number Tests/Day = *** Overall Mean FS Glucose = *** Standard Deviation = ***  BG Ranges: Low = *** High = ***   Hypoglycemic Events/30 Days: BG < 50 = *** Episodes of symptomatic severe hypoglycemia = ***   DIABETIC COMPLICATIONS: Microvascular complications:   ***  Denies: ***  Last eye exam: Completed   Macrovascular complications:   ***  Denies: CAD, PVD, CVA   PAST HISTORY: Past Medical History:  Past Medical History:  Diagnosis Date  . Anemia   . Blood transfusion without reported diagnosis   . Diabetes mellitus without complication ([place])   . Family history of adverse reaction to anesthesia    "mother has difficulty waking up after surgery ."  . Fibroid  uterus 2014  . History of blood transfusion 2014, 2015   for symptomatic anemia  . Synovitis of wrist    left  . Vitamin B deficiency     Past Surgical History:  Past Surgical History:  Procedure Laterality Date  . ABDOMINAL HYSTERECTOMY    . CHOLECYSTECTOMY    . LAPAROSCOPIC VAGINAL HYSTERECTOMY WITH SALPINGECTOMY Bilateral 07/15/2017   Procedure: LAPAROSCOPIC ASSISTED VAGINAL HYSTERECTOMY WITH BILATERAL SALPINGECTOMY;  Surgeon: Rubie Maid, MD;  Location: [facility];  Service: Gynecology;  Laterality: Bilateral;  . TENDON TRANSFER Left 06/06/2018   Procedure: Repair / Reconstruction, Open Extensor Carpi Ulnaris Debridement Repair and Stabilization;  Surgeon: Roseanne Kaufman, MD;  Location: [place];  Service: Orthopedics;  Laterality: Left;  . WRIST ARTHROSCOPY Left 06/06/2018   Procedure: Left wrist arthroscopy with synovectomy;  Surgeon: Roseanne Kaufman, MD;  Location: [place];  Service: Orthopedics;  Laterality: Left;  90 mins      Social History:  reports that she has never smoked. She has never used smokeless tobacco. She reports that she does not drink alcohol or use drugs. Family History:  Family History  Problem Relation Age of Onset  . Hyperthyroidism Mother   . Diabetes Mother   . Hypertension Mother   . Hyperthyroidism Maternal Aunt   . Diabetes Maternal Grandmother   . Diabetes Maternal Uncle   . Diabetes Maternal Grandfather  HOME MEDICATIONS: Allergies as of 07/01/2018      Reactions   Meloxicam Itching   Tramadol Itching, Swelling      Medication List        Accurate as of 06/30/18  2:13 PM. Always use your most recent med list.          Cetirizine HCl 10 MG Caps Take 1 capsule (10 mg total) by mouth daily.   cyanocobalamin 1000 MCG/ML injection Commonly known as:  (VITAMIN B-12) Inject 1 mL (1,000 mcg total) into the muscle every 30 (thirty) days.   ibuprofen 800 MG tablet Commonly known as:   ADVIL,MOTRIN TAKE 1 TABLET BY MOUTH EVERY 8 HOURS AS NEEDED   metFORMIN 500 MG tablet Commonly known as:  GLUCOPHAGE Take 1 tablet (500 mg total) by mouth 2 (two) times daily with a meal.   OXYCODONE HCL PO Take 5 mg by mouth every 4 (four) hours as needed.   phentermine 37.5 MG tablet Commonly known as:  ADIPEX-P Take 1 tablet (37.5 mg total) by mouth daily before breakfast.   Vitamin D (Ergocalciferol) 50000 units Caps capsule Commonly known as:  DRISDOL Take 1 capsule (50,000 Units total) by mouth 2 (two) times a week.        ALLERGIES: Allergies  Allergen Reactions  . Meloxicam Itching  . Tramadol Itching and Swelling     REVIEW OF SYSTEMS: A comprehensive ROS was conducted with the patient and is negative except as per HPI and below:  ROS    OBJECTIVE:   VITAL SIGNS: LMP 07/07/2017    PHYSICAL EXAM:  General: Pt appears well and is in NAD  Hydration: Well-hydrated with moist mucous membranes and good skin turgor  HEENT: Head: Unremarkable with good dentition. Oropharynx clear without exudate.  Eyes: External eye exam normal without stare, lid lag or exophthalmos.  EOM intact.  PERRL.  Neck: General: Supple without adenopathy or carotid bruits. Thyroid: Thyroid size normal.  No goiter or nodules appreciated. No thyroid bruit.  Lungs: Clear with good BS bilat with no rales, rhonchi, or wheezes  Heart: RRR with normal S1 and S2 and no gallops; no murmurs; no rub  Abdomen: Normoactive bowel sounds, soft, nontender, without masses or organomegaly palpable  Extremities:  Lower extremities - No pretibial edema. No lesions.  Skin: Normal texture and temperature to palpation. No rash noted. No Acanthosis nigricans/skin tags. No lipohypertrophy.  Neuro: MS is good with appropriate affect, pt is alert and Ox3    DM foot exam:    DATA REVIEWED:  Lab Results  Component Value Date   HGBA1C 6.6 (H) 04/16/2018   Lab Results  Component Value Date   CREATININE  0.57 06/03/2018   No results found for: MICRALBCREAT  No results found for: CHOL, HDL, LDLCALC, LDLDIRECT, TRIG, CHOLHDL      ASSESSMENT / PLAN / RECOMMENDATIONS:   1) Type *** Diabetes Mellitus, ***controlled, With*** complications - Most recent A1c of *** %. Goal A1c < *** %.  ***  Plan: GENERAL:  ***  MEDICATIONS:  ***  EDUCATION / INSTRUCTIONS:  BG monitoring instructions: Patient is instructed to check her blood sugars *** times a day, ***.  Call [place] [facility] if: BG persistently < 70 or > 300. . I reviewed the Rule of 15 for the treatment of hypoglycemia in detail with the patient. Literature supplied.   2) Diabetic complications:   Eye: Does *** have known diabetic retinopathy. Last eye exam was   Neuro/ Feet: Does ***  have known diabetic peripheral neuropathy.  Renal: Patient does *** have known baseline CKD. She is *** on an ACEI/ARB at present.   3) Lipids: Patient is *** on a statin.    4) Hypertension: ***  at goal of < 140/90 mmHg.       Signed electronically by: Mack Guise, MD  [facility] Endocrinology  [facility] [address] [place], [place] [address] Phone: [phone] FAX: [phone]   CC: Patient, No Pcp Per No address on file Phone: None  Fax: None    Return to Endocrinology clinic as below: Future Appointments  Date Time Provider [place]  07/01/2018 10:30 AM Aquan Kope, Melanie Crazier, MD [facility] None  12/15/2018  2:30 PM [facility] None  12/22/2018  1:00 PM Lloyd Huger, MD [facility] None  12/22/2018  1:30 PM [facility] INFUSION CHAIR 1 [facility] None

## 2018-07-01 ENCOUNTER — Ambulatory Visit: Payer: 59 | Admitting: Internal Medicine

## 2018-07-14 ENCOUNTER — Ambulatory Visit: Payer: 59 | Admitting: Internal Medicine

## 2018-07-22 ENCOUNTER — Emergency Department
Admission: EM | Admit: 2018-07-22 | Discharge: 2018-07-22 | Disposition: A | Discharge status: Home / Self Care | Payer: 59 | Attending: Emergency Medicine | Admitting: Emergency Medicine

## 2018-07-22 ENCOUNTER — Emergency Department: Payer: 59

## 2018-07-22 ENCOUNTER — Encounter: Payer: Self-pay | Admitting: Emergency Medicine

## 2018-07-22 ENCOUNTER — Other Ambulatory Visit: Payer: Self-pay

## 2018-07-22 DIAGNOSIS — Z7984 Long term (current) use of oral hypoglycemic drugs: Secondary | ICD-10-CM | POA: Insufficient documentation

## 2018-07-22 DIAGNOSIS — E119 Type 2 diabetes mellitus without complications: Secondary | ICD-10-CM | POA: Insufficient documentation

## 2018-07-22 DIAGNOSIS — Z79899 Other long term (current) drug therapy: Secondary | ICD-10-CM | POA: Diagnosis not present

## 2018-07-22 DIAGNOSIS — M79672 Pain in left foot: Secondary | ICD-10-CM | POA: Insufficient documentation

## 2018-07-22 LAB — COMPREHENSIVE METABOLIC PANEL
ALT: 16 U/L (ref 0–44)
ANION GAP: 9 (ref 5–15)
AST: 16 U/L (ref 15–41)
Albumin: 3.4 g/dL — ABNORMAL LOW (ref 3.5–5.0)
Alkaline Phosphatase: 58 U/L (ref 38–126)
BILIRUBIN TOTAL: 0.4 mg/dL (ref 0.3–1.2)
BUN: 17 mg/dL (ref 6–20)
CHLORIDE: 105 mmol/L (ref 98–111)
CO2: 26 mmol/L (ref 22–32)
Calcium: 8.7 mg/dL — ABNORMAL LOW (ref 8.9–10.3)
Creatinine, Ser: 0.55 mg/dL (ref 0.44–1.00)
GFR calc Af Amer: >60 mL/min (ref >60)
Glucose, Bld: 159 mg/dL — ABNORMAL HIGH (ref 70–99)
POTASSIUM: 3.9 mmol/L (ref 3.5–5.1)
Sodium: 140 mmol/L (ref 135–145)
Total Protein: 6.7 g/dL (ref 6.5–8.1)

## 2018-07-22 LAB — CBC WITH DIFFERENTIAL/PLATELET
Abs Immature Granulocytes: 0 10*3/uL (ref 0.00–0.07)
BASOS PCT: 1 %
Basophils Absolute: 0 10*3/uL (ref 0.0–0.1)
EOS ABS: 0 10*3/uL (ref 0.0–0.5)
EOS PCT: 1 %
HEMATOCRIT: 40.9 % (ref 36.0–46.0)
Hemoglobin: 13.2 g/dL (ref 12.0–15.0)
IMMATURE GRANULOCYTES: 0 %
LYMPHS ABS: 1.4 10*3/uL (ref 0.7–4.0)
Lymphocytes Relative: 42 %
MCH: 26.7 pg (ref 26.0–34.0)
MCHC: 32.3 g/dL (ref 30.0–36.0)
MCV: 82.8 fL (ref 80.0–100.0)
Monocytes Absolute: 0.4 10*3/uL (ref 0.1–1.0)
Monocytes Relative: 10 %
NEUTROS PCT: 46 %
Neutro Abs: 1.6 10*3/uL — ABNORMAL LOW (ref 1.7–7.7)
PLATELETS: 286 10*3/uL (ref 150–400)
RBC: 4.94 MIL/uL (ref 3.87–5.11)
RDW: 13.4 % (ref 11.5–15.5)
WBC: 3.4 10*3/uL — ABNORMAL LOW (ref 4.0–10.5)
nRBC: 0 % (ref 0.0–0.2)

## 2018-07-22 MED ORDER — IBUPROFEN 600 MG PO TABS: 600.0000 mg | ORAL_TABLET | Freq: Three times a day (TID) | ORAL | 0 refills | Status: DC | PRN

## 2018-07-22 MED ORDER — HYDROCODONE-ACETAMINOPHEN 5-325 MG PO TABS: 1.0000 | ORAL_TABLET | Freq: Four times a day (QID) | ORAL | 0 refills | Status: DC | PRN

## 2018-07-22 NOTE — ED Notes (Addendum)
L foot swelling x3 weeks unrelieved by ibuprofen and elevation. Hurts with pressure/walking. Denies injury. Ambulatory

## 2018-07-22 NOTE — ED Notes (Addendum)
First Nurse Note: Patient complaining of "swelling and tingling" in left foot X 3 weeks.  Denies injury.

## 2018-07-22 NOTE — ED Provider Notes (Signed)
[facility] Emergency Department Provider Note  ____________________________________________   None    (approximate)  I have reviewed the triage vital signs and the nursing notes.   HISTORY  Chief Complaint Foot Swelling   HPI Sharon Marquez is a 47 y.o. female presents to the ED with complaint of left foot and ankle pain for last 3 weeks.  Patient states that she has not had an injury to her knowledge.  Patient states that pain is increased with weight bearing.  She denies any previous history of gout.  Currently she rates her pain as an 8 out of 10.   Past Medical History:  Diagnosis Date  . Anemia   . Blood transfusion without reported diagnosis   . Diabetes mellitus without complication ([place])   . Family history of adverse reaction to anesthesia    "mother has difficulty waking up after surgery ."  . Fibroid uterus 2014  . History of blood transfusion 2014, 2015   for symptomatic anemia  . Synovitis of wrist    left  . Vitamin B deficiency     Patient Active Problem List   Diagnosis Date Noted  . S/P laparoscopic assisted vaginal hysterectomy ([facility]) 07/15/2017  . Vitamin D deficiency 02/19/2017  . B12 deficiency 10/22/2016  . Morbid obesity ([place]) 08/05/2016  . Abnormal uterine bleeding 08/05/2016  . History of blood transfusion 08/02/2016  . Iron deficiency anemia 07/11/2016  . Fibroid uterus 08/27/2012    Past Surgical History:  Procedure Laterality Date  . ABDOMINAL HYSTERECTOMY    . CHOLECYSTECTOMY    . LAPAROSCOPIC VAGINAL HYSTERECTOMY WITH SALPINGECTOMY Bilateral 07/15/2017   Procedure: LAPAROSCOPIC ASSISTED VAGINAL HYSTERECTOMY WITH BILATERAL SALPINGECTOMY;  Surgeon: Rubie Maid, MD;  Location: [facility];  Service: Gynecology;  Laterality: Bilateral;  . TENDON TRANSFER Left 06/06/2018   Procedure: Repair / Reconstruction, Open Extensor Carpi Ulnaris Debridement Repair and Stabilization;  Surgeon: Roseanne Kaufman, MD;  Location:  [place];  Service: Orthopedics;  Laterality: Left;  . WRIST ARTHROSCOPY Left 06/06/2018   Procedure: Left wrist arthroscopy with synovectomy;  Surgeon: Roseanne Kaufman, MD;  Location: [place];  Service: Orthopedics;  Laterality: Left;  90 mins    Prior to Admission medications   Medication Sig Start Date End Date Taking? Authorizing Provider  Cetirizine HCl (ZYRTEC ALLERGY) 10 MG CAPS Take 1 capsule (10 mg total) by mouth daily. Patient taking differently: Take 1 capsule by mouth daily as needed.  01/01/18   Rubie Maid, MD  cyanocobalamin (,VITAMIN B-12,) 1000 MCG/ML injection Inject 1 mL (1,000 mcg total) into the muscle every 30 (thirty) days. 05/16/17   Shambley, Melody N, CNM  HYDROcodone-acetaminophen (NORCO/VICODIN) 5-325 MG tablet Take 1 tablet by mouth every 6 (six) hours as needed for moderate pain. 07/22/18   Johnn Hai, PA-C  ibuprofen (ADVIL,MOTRIN) 600 MG tablet Take 1 tablet (600 mg total) by mouth every 8 (eight) hours as needed. 07/22/18   Johnn Hai, PA-C  metFORMIN (GLUCOPHAGE) 500 MG tablet Take 1 tablet (500 mg total) by mouth 2 (two) times daily with a meal. 04/17/18   Shambley, Melody N, CNM  OXYCODONE HCL PO Take 5 mg by mouth every 4 (four) hours as needed.    [provider]  phentermine (ADIPEX-P) 37.5 MG tablet Take 1 tablet (37.5 mg total) by mouth daily before breakfast. 01/08/18   Shambley, Melody N, CNM  Vitamin D, Ergocalciferol, (DRISDOL) 50000 units CAPS capsule Take 1 capsule (50,000 Units total)  by mouth 2 (two) times a week. 04/17/18   Shambley, Melody N, CNM    Allergies Meloxicam and Tramadol  Family History  Problem Relation Age of Onset  . Hyperthyroidism Mother   . Diabetes Mother   . Hypertension Mother   . Hyperthyroidism Maternal Aunt   . Diabetes Maternal Grandmother   . Diabetes Maternal Uncle   . Diabetes Maternal Grandfather     Social History Social History   Tobacco Use  .  Smoking status: Never Smoker  . Smokeless tobacco: Never Used  Substance Use Topics  . Alcohol use: No  . Drug use: No    Review of Systems Constitutional: No fever/chills Cardiovascular: Denies chest pain. Respiratory: Denies shortness of breath. Musculoskeletal: Positive for left foot and ankle pain. Skin: Negative for rash. Neurological: Negative for headaches, focal weakness or numbness. ____________________________________________   PHYSICAL EXAM:  VITAL SIGNS: ED Triage Vitals  Enc Vitals Group     BP 07/22/18 0854 (!) 155/80     Pulse Rate 07/22/18 0854 65     Resp 07/22/18 0854 16     Temp 07/22/18 0854 98.3 F (36.8 C)     Temp Source 07/22/18 0854 Oral     SpO2 07/22/18 0854 98 %     Weight 07/22/18 0852 265 lb (120.2 kg)     Height 07/22/18 0852 5\' 3"  (1.6 m)     Head Circumference --      Peak Flow --      Pain Score 07/22/18 0852 8     Pain Loc --      Pain Edu? --      Excl. in [place]? --    Constitutional: Alert and oriented. Well appearing and in no acute distress. Eyes: Conjunctivae are normal.  Head: Atraumatic. Neck: No stridor.   Cardiovascular: Normal rate, regular rhythm. Grossly normal heart sounds.  Good peripheral circulation. Respiratory: Normal respiratory effort.  No retractions. Lungs CTAB. Musculoskeletal: Examination of left foot and ankle there is no gross deformity noted.  There is some soft tissue edema noted to the dorsal aspect of the shows soft tissue swelling.  There is no soft tissue injury or edema noted bilateral malleolus.  Pulses present.  Skin is intact.  Capillary refill is less than 3 seconds. Neurologic:  Normal speech and language. No gross focal neurologic deficits are appreciated. No gait instability. Skin:  Skin is warm, dry and intact. No rash noted. Psychiatric: Mood and affect are normal. Speech and behavior are normal.  ____________________________________________   LABS (all labs ordered are listed, but only  abnormal results are displayed)  Labs Reviewed  CBC WITH DIFFERENTIAL/PLATELET - Abnormal; Notable for the following components:      Result Value   WBC 3.4 (*)    Neutro Abs 1.6 (*)    All other components within normal limits  COMPREHENSIVE METABOLIC PANEL - Abnormal; Notable for the following components:   Glucose, Bld 159 (*)    Calcium 8.7 (*)    Albumin 3.4 (*)    All other components within normal limits    RADIOLOGY  ED MD interpretation:   Left foot x-ray is negative for acute injury.  Official radiology report(s): Dg Foot Complete Left  Result Date: 07/22/2018 CLINICAL DATA:  Left foot and ankle pain and swelling for several weeks, initial encounter EXAM: LEFT FOOT - COMPLETE 3+ VIEW COMPARISON:  None. FINDINGS: No acute fracture or dislocation is noted. Very mild dorsal soft tissue swelling is seen. No bony  erosive changes are identified. IMPRESSION: Mild soft tissue swelling without acute bony abnormality. Electronically Signed   By: Inez Catalina M.D.   On: 07/22/2018 09:45    ____________________________________________   PROCEDURES  Procedure(s) performed: None  Procedures  Critical Care performed: No  ____________________________________________   INITIAL IMPRESSION / ASSESSMENT AND PLAN / ED COURSE  As part of my medical decision making, I reviewed the following data within the electronic MEDICAL RECORD NUMBER Notes from prior ED visits and [place] Controlled Substance Database  47 year old female presents to the ED with complaint of swelling and pain to her left foot and ankle for the last 3 weeks.  She denies any injury.  X-rays were reviewed and read as negative.  Patient was made aware.  She was given a prescription for Motrin 600 mg every 8 hours and Norco every 6 hours as needed for pain.  Patient is to ice and elevate.  She was also placed in an Ace wrap for added support.  She is to follow-up with her PCP if any continued problems or Dr. Harlow Mares who is  on-call for orthopedics if continued problems.  ____________________________________________   FINAL CLINICAL IMPRESSION(S) / ED DIAGNOSES  Final diagnoses:  Acute foot pain, left     ED Discharge Orders         Ordered    HYDROcodone-acetaminophen (NORCO/VICODIN) 5-325 MG tablet  Every 6 hours PRN     07/22/18 1044    ibuprofen (ADVIL,MOTRIN) 600 MG tablet  Every 8 hours PRN     07/22/18 1044           Note:  This document was prepared using Dragon voice recognition software and may include unintentional dictation errors.    Johnn Hai, PA-C 07/22/18 1534    Lisa Roca, MD 07/22/18 [phone]

## 2018-07-22 NOTE — Discharge Instructions (Signed)
Follow-up with your primary care provider or doctor of your choice for continued control of your diabetes.  Continue taking medication as directed.  Also information about an endocrinologist in [place] as listed on your discharge papers.  Elevate your foot today.  You may use ice as needed for swelling.  Norco as needed every 6 hours and ibuprofen 600 mg every 8 hours for inflammation.

## 2018-07-22 NOTE — ED Triage Notes (Addendum)
Patient reports swelling in left foot and ankle for 3 weeks. Reports tenderness with palpation. Patient denies any known injury. Good pedal pulses noted bilaterally. Patient ambulatory in triage without difficulty.

## 2018-09-22 IMAGING — US US TRANSVAGINAL NON-OB
1 series · 13 of 25 positions shown · non-contrast
Comparison: Pelvic ultrasound 06/05/2013

CLINICAL DATA: Patient with abnormal uterine bleeding.



[Series 1: us transvaginal non-ob · 0.28mm/px · 13 of 103 slices shown]
[im 1/103]
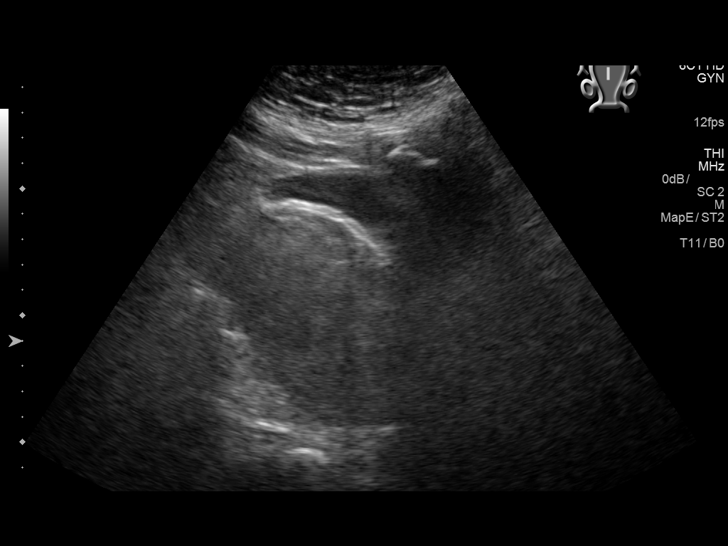
[im 9/103]
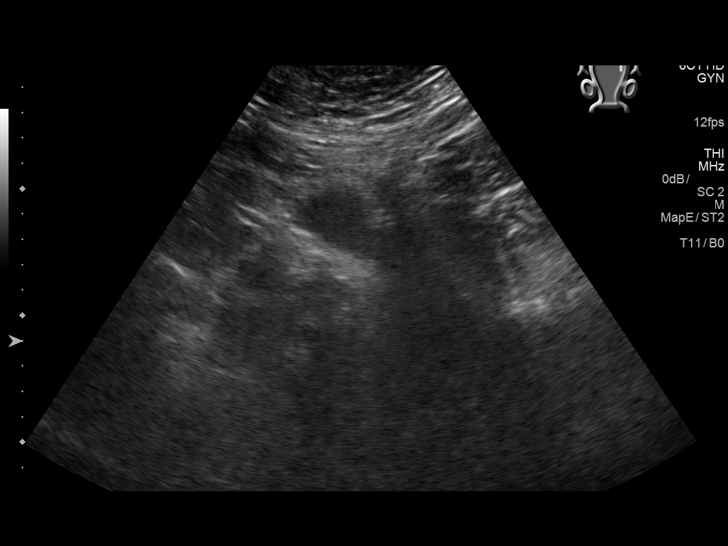
[im 18/103]
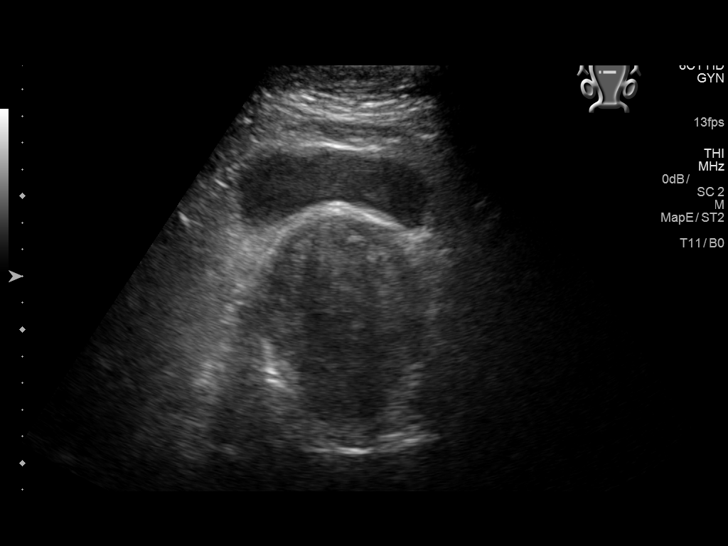
[im 26/103]
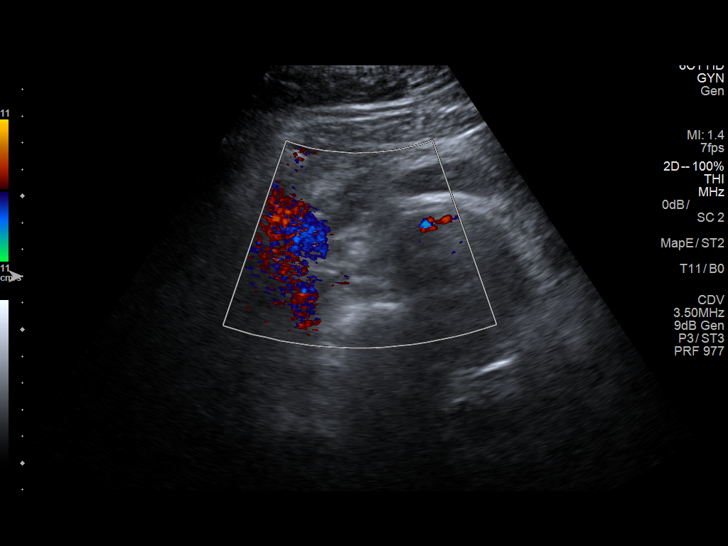
[im 35/103]
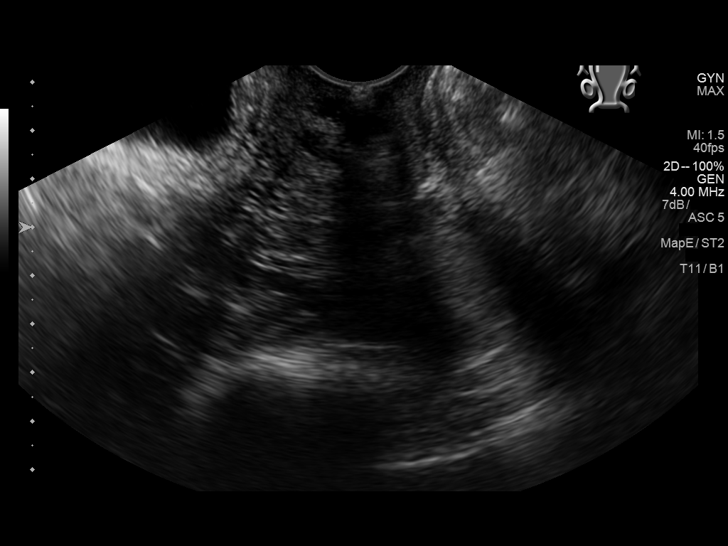
[im 43/103]
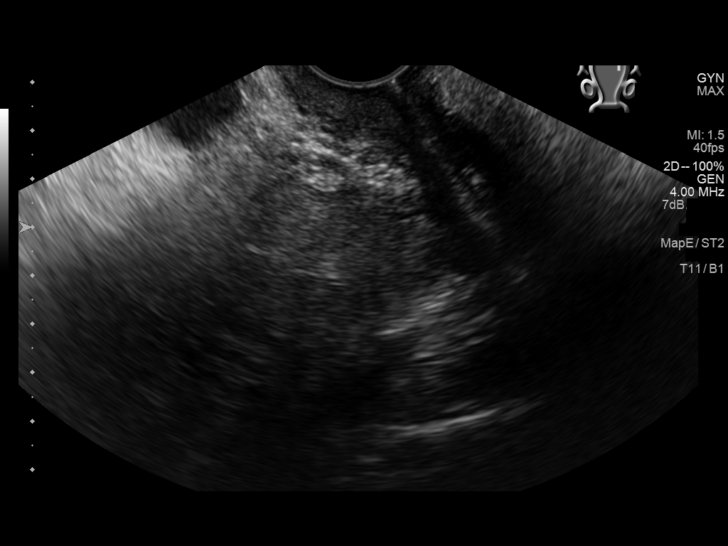
[im 52/103]
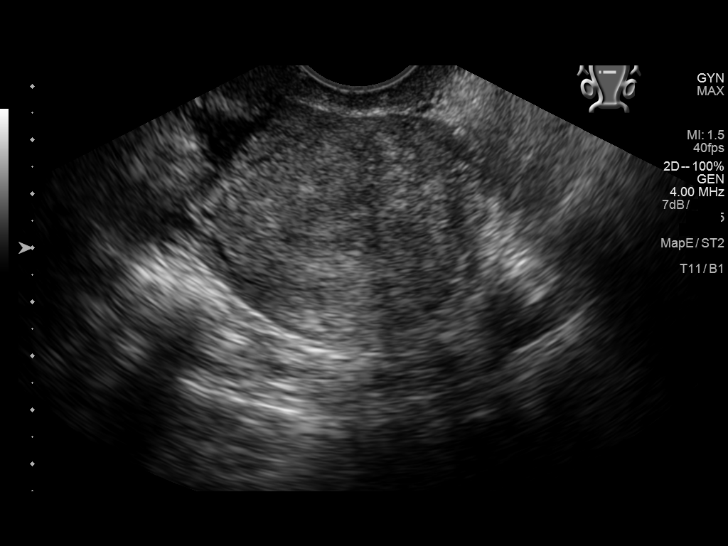
[im 60/103]
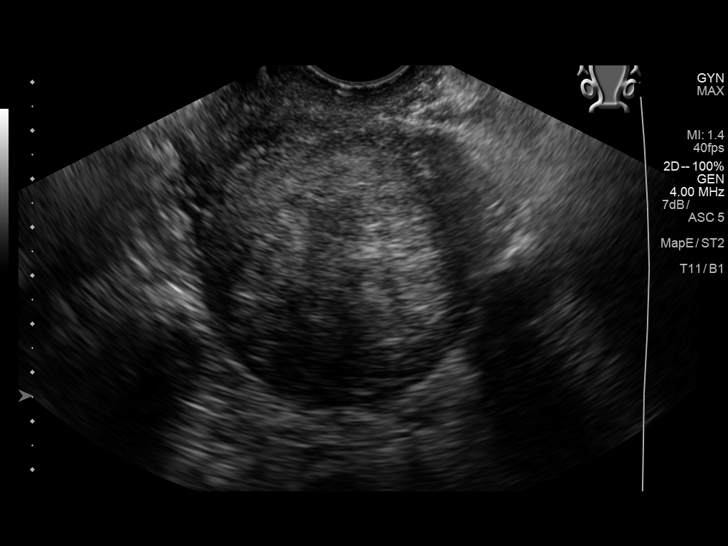
[im 69/103]
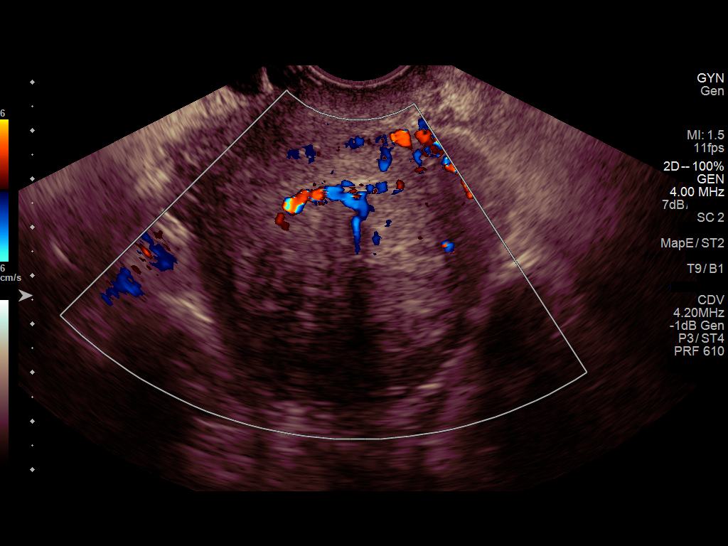
[im 77/103]
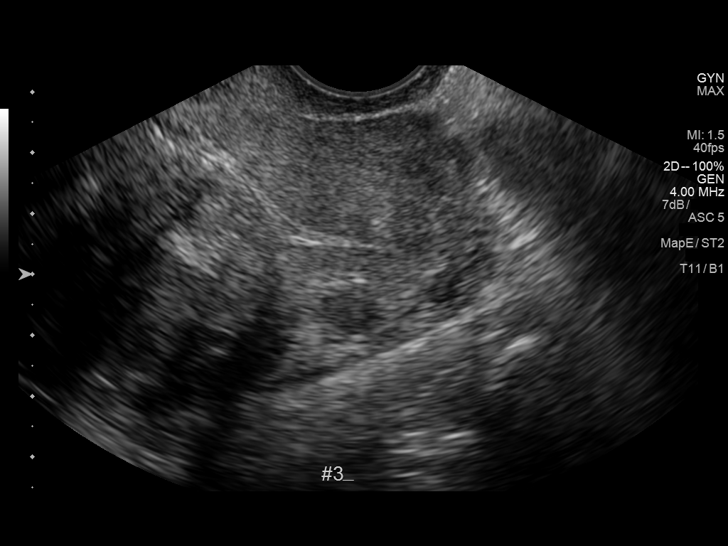
[im 86/103]
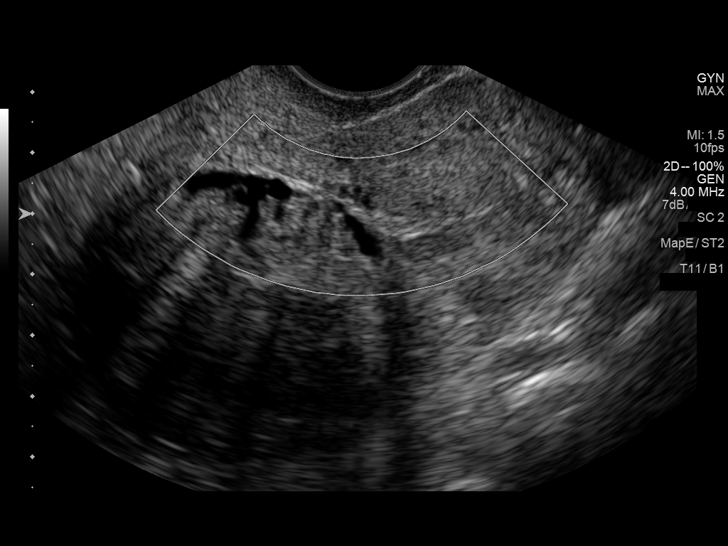
[im 94/103]
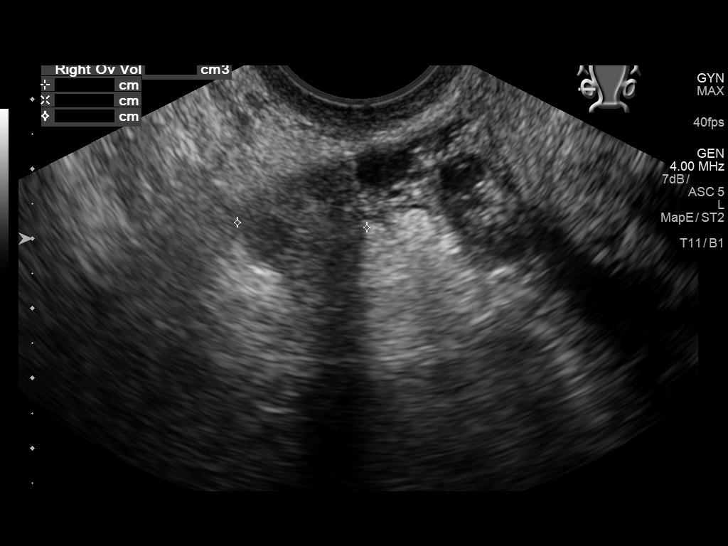
[im 103/103]
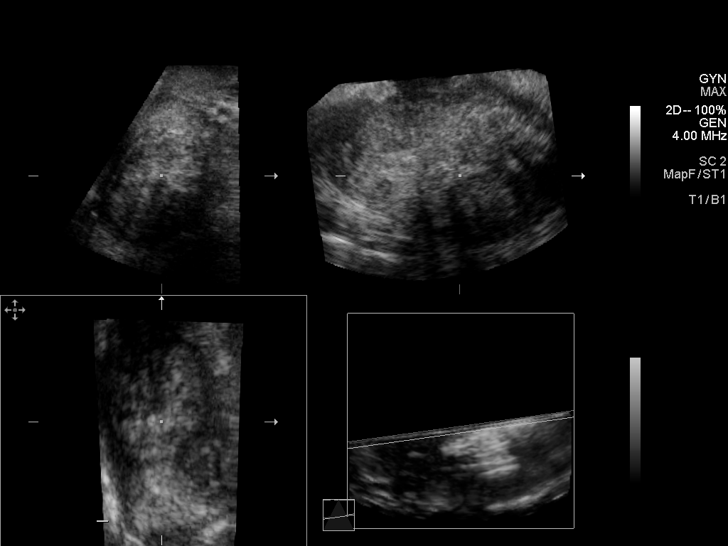

[13 of 25 positions shown; findings below may reference images not displayed]

FINDINGS: Uterus

Measurements: 6.9 x 3.8 x 5.1 cm. Retroverted. There is a 5.8 x
x 5.0 cm fibroid within the right aspect of the uterine body with a
large submucosal component. Additionally there is a 1.1 x 0.8 x
cm intramural fibroid with the uterine fundus. There is an
additional adjacent 0.6 x 0.7 x 0.8 cm intramural fibroid within the
uterine fundus.

Endometrium

Thickness: 3 mm.  No focal abnormality visualized.

Right ovary

Not visualized.

Left ovary

Measurements: 3.1 x 2.1 x 1.9 cm. Normal appearance/no adnexal mass.

Other findings

No abnormal free fluid.
IMPRESSION: Large fibroid within the right aspect of the uterine body with a
significant submucosal component which appears to exert mass effect
and displace the endometrium. This may represent the causative
etiology for uterine bleeding.

Endometrium measures 3 mm. If bleeding remains unresponsive to
hormonal or medical therapy, sonohysterogram should be considered
for focal lesion work-up. (Ref: Radiological Reasoning: Algorithmic
Workup of Abnormal Vaginal Bleeding with Endovaginal Sonography and
Sonohysterography. AJR [id number]; 191:S68-73)

## 2018-12-15 ENCOUNTER — Inpatient Hospital Stay: Payer: Self-pay

## 2018-12-22 ENCOUNTER — Inpatient Hospital Stay: Payer: Self-pay | Admitting: Oncology

## 2018-12-22 ENCOUNTER — Inpatient Hospital Stay: Payer: Self-pay

## 2019-01-28 ENCOUNTER — Other Ambulatory Visit: Payer: Self-pay

## 2019-01-29 ENCOUNTER — Inpatient Hospital Stay: Payer: Self-pay | Attending: Oncology

## 2019-01-29 ENCOUNTER — Other Ambulatory Visit: Payer: Self-pay

## 2019-01-29 DIAGNOSIS — Z90710 Acquired absence of both cervix and uterus: Secondary | ICD-10-CM | POA: Insufficient documentation

## 2019-01-29 DIAGNOSIS — E119 Type 2 diabetes mellitus without complications: Secondary | ICD-10-CM | POA: Insufficient documentation

## 2019-01-29 DIAGNOSIS — Z79899 Other long term (current) drug therapy: Secondary | ICD-10-CM | POA: Insufficient documentation

## 2019-01-29 DIAGNOSIS — D509 Iron deficiency anemia, unspecified: Secondary | ICD-10-CM | POA: Insufficient documentation

## 2019-01-29 DIAGNOSIS — E538 Deficiency of other specified B group vitamins: Secondary | ICD-10-CM | POA: Insufficient documentation

## 2019-01-29 DIAGNOSIS — D500 Iron deficiency anemia secondary to blood loss (chronic): Secondary | ICD-10-CM

## 2019-01-29 LAB — CBC WITH DIFFERENTIAL/PLATELET
Abs Immature Granulocytes: 0.01 10*3/uL (ref 0.00–0.07)
Basophils Absolute: 0 10*3/uL (ref 0.0–0.1)
Basophils Relative: 1 %
Eosinophils Absolute: 0 10*3/uL (ref 0.0–0.5)
Eosinophils Relative: 0 %
HCT: 39.2 % (ref 36.0–46.0)
Hemoglobin: 12.7 g/dL (ref 12.0–15.0)
Immature Granulocytes: 0 %
Lymphocytes Relative: 44 %
Lymphs Abs: 2.1 10*3/uL (ref 0.7–4.0)
MCH: 26.2 pg (ref 26.0–34.0)
MCHC: 32.4 g/dL (ref 30.0–36.0)
MCV: 81 fL (ref 80.0–100.0)
Monocytes Absolute: 0.6 10*3/uL (ref 0.1–1.0)
Monocytes Relative: 12 %
Neutro Abs: 2 10*3/uL (ref 1.7–7.7)
Neutrophils Relative %: 43 %
Platelets: 237 10*3/uL (ref 150–400)
RBC: 4.84 MIL/uL (ref 3.87–5.11)
RDW: 13.7 % (ref 11.5–15.5)
WBC: 4.7 10*3/uL (ref 4.0–10.5)
nRBC: 0 % (ref 0.0–0.2)

## 2019-01-29 LAB — VITAMIN B12: Vitamin B-12: 260 pg/mL (ref 180–914)

## 2019-01-29 LAB — IRON AND TIBC
Iron: 56 ug/dL (ref 28–170)
Saturation Ratios: 16 % (ref 10.4–31.8)
TIBC: 342 ug/dL (ref 250–450)
UIBC: 286 ug/dL

## 2019-01-29 LAB — FERRITIN: Ferritin: 38 ng/mL (ref 11–307)

## 2019-02-01 NOTE — Progress Notes (Signed)
[facility]  [phone] Fax:(336) [phone]  ID: Sharon Marquez OB: [birth year]  MR#: [id number]  RWE#:[id number]  Patient Care Team: Patient, No Pcp Per as PCP - General (General Practice)  I connected with Sharon Marquez on 02/07/19 at  2:00 PM EDT by video enabled telemedicine visit and verified that I am speaking with the correct person using two identifiers.   I discussed the limitations, risks, security and privacy concerns of performing an evaluation and management service by telemedicine and the availability of in-person appointments. I also discussed with the patient that there may be a patient responsible charge related to this service. The patient expressed understanding and agreed to proceed.   Other persons participating in the visit and their role in the encounter: Patient, MD  Patient's location: Home Provider's location: Clinic  CHIEF COMPLAINT: Iron deficiency anemia, B-12 deficiency.  INTERVAL HISTORY: Patient agreed to video enabled telemedicine visit for discussion of her laboratory work and further evaluation.  She currently feels well and is asymptomatic.  She does not complain of any weakness or fatigue today. She has no neurologic complaints. She denies any recent fevers or illnesses. She has a good appetite and denies weight loss.  She denies any chest pain, shortness of breath, cough, or hemoptysis.  She denies any nausea, vomiting, constipation, or diarrhea. She has no melena or hematochezia. She has no urinary complaints.  Patient feels at her baseline offers no specific complaints today.  REVIEW OF SYSTEMS:   Review of Systems  Constitutional: Negative.  Negative for fever, malaise/fatigue and weight loss.  Respiratory: Negative.  Negative for cough and shortness of breath.   Cardiovascular: Negative.  Negative for chest pain and leg swelling.  Gastrointestinal: Negative.  Negative for abdominal pain, blood in stool and melena.   Genitourinary: Negative.  Negative for hematuria.  Musculoskeletal: Negative.  Negative for back pain.  Skin: Negative.  Negative for rash.  Neurological: Negative.  Negative for focal weakness, weakness and headaches.  Psychiatric/Behavioral: Negative.  The patient is not nervous/anxious.     As per HPI. Otherwise, a complete review of systems is negative.  PAST MEDICAL HISTORY: Past Medical History:  Diagnosis Date  . Anemia   . Blood transfusion without reported diagnosis   . Diabetes mellitus without complication ([place])   . Family history of adverse reaction to anesthesia    "mother has difficulty waking up after surgery ."  . Fibroid uterus 2014  . History of blood transfusion 2014, 2015   for symptomatic anemia  . Synovitis of wrist    left  . Vitamin B deficiency     PAST SURGICAL HISTORY: Past Surgical History:  Procedure Laterality Date  . ABDOMINAL HYSTERECTOMY    . CHOLECYSTECTOMY    . LAPAROSCOPIC VAGINAL HYSTERECTOMY WITH SALPINGECTOMY Bilateral 07/15/2017   Procedure: LAPAROSCOPIC ASSISTED VAGINAL HYSTERECTOMY WITH BILATERAL SALPINGECTOMY;  Surgeon: Rubie Maid, MD;  Location: [facility];  Service: Gynecology;  Laterality: Bilateral;  . TENDON TRANSFER Left 06/06/2018   Procedure: Repair / Reconstruction, Open Extensor Carpi Ulnaris Debridement Repair and Stabilization;  Surgeon: Roseanne Kaufman, MD;  Location: [facility];  Service: Orthopedics;  Laterality: Left;  . WRIST ARTHROSCOPY Left 06/06/2018   Procedure: Left wrist arthroscopy with synovectomy;  Surgeon: Roseanne Kaufman, MD;  Location: [facility];  Service: Orthopedics;  Laterality: Left;  90 mins    FAMILY HISTORY: Family History  Problem Relation Age of Onset  . Hyperthyroidism Mother   . Diabetes Mother   .  Hypertension Mother   . Hyperthyroidism Maternal Aunt   . Diabetes Maternal Grandmother   . Diabetes Maternal Uncle   . Diabetes Maternal Grandfather      ADVANCED DIRECTIVES (Y/N):  N  HEALTH MAINTENANCE: Social History   Tobacco Use  . Smoking status: Never Smoker  . Smokeless tobacco: Never Used  Substance Use Topics  . Alcohol use: No  . Drug use: No     Colonoscopy:  PAP:  Bone density:  Lipid panel:  Allergies  Allergen Reactions  . Meloxicam Itching  . Tramadol Itching and Swelling    No current outpatient medications on file.   No current facility-administered medications for this visit.     OBJECTIVE: There were no vitals filed for this visit.   There is no height or weight on file to calculate BMI.    ECOG FS:0 - Asymptomatic  General: Well-developed, well-nourished, no acute distress. HEENT: Normocephalic. Neuro: Alert, answering all questions appropriately. Cranial nerves grossly intact. Skin: No rashes or petechiae noted. Psych: Normal affect.  LAB RESULTS:  Lab Results  Component Value Date   NA 140 07/22/2018   K 3.9 07/22/2018   CL 105 07/22/2018   CO2 26 07/22/2018   GLUCOSE 159 (H) 07/22/2018   BUN 17 07/22/2018   CREATININE 0.55 07/22/2018   CALCIUM 8.7 (L) 07/22/2018   PROT 6.7 07/22/2018   ALBUMIN 3.4 (L) 07/22/2018   AST 16 07/22/2018   ALT 16 07/22/2018   ALKPHOS 58 07/22/2018   BILITOT 0.4 07/22/2018   GFRNONAA >60 07/22/2018   GFRAA >60 07/22/2018    Lab Results  Component Value Date   WBC 4.7 01/29/2019   NEUTROABS 2.0 01/29/2019   HGB 12.7 01/29/2019   HCT 39.2 01/29/2019   MCV 81.0 01/29/2019   PLT 237 01/29/2019   Lab Results  Component Value Date   IRON 56 01/29/2019   TIBC 342 01/29/2019   IRONPCTSAT 16 01/29/2019   Lab Results  Component Value Date   FERRITIN 38 01/29/2019     STUDIES: No results found.  ASSESSMENT: Iron deficiency anemia, B-12 deficiency.  PLAN:    1. Iron deficiency anemia: Patient's hemoglobin and iron stores continue to be within normal limits. Now that she has a hysterectomy, her heavy menses have subsided and she may not  require as frequent infusions.  She does not require additional IV Feraheme today.  Her last treatment occurred on November 04, 2017.  After discussion with the patient, it was agreed upon that no further follow-up is necessary.  Please refer patient back if there are any questions or concerns. 2. B-12 deficiency: Resolved.  Patient last received B12 on March 17, 2018.  Monitor.   3.  Heavy menses: Resolved.  Patient had total hysterectomy in November 2018.  Continue follow-up with OB/GYN as indicated.  I provided 15 minutes of face-to-face video visit time during this encounter, and > 50% was spent counseling as documented under my assessment & plan.   Patient expressed understanding and was in agreement with this plan. She also understands that She can call clinic at any time with any questions, concerns, or complaints.    Lloyd Huger, MD   02/07/2019 6:37 AM

## 2019-02-04 ENCOUNTER — Other Ambulatory Visit: Payer: Self-pay

## 2019-02-05 ENCOUNTER — Encounter: Payer: Self-pay | Admitting: Oncology

## 2019-02-05 ENCOUNTER — Ambulatory Visit: Payer: Self-pay

## 2019-02-05 ENCOUNTER — Inpatient Hospital Stay (HOSPITAL_BASED_OUTPATIENT_CLINIC_OR_DEPARTMENT_OTHER): Payer: Self-pay | Admitting: Oncology

## 2019-02-05 ENCOUNTER — Other Ambulatory Visit: Payer: Self-pay

## 2019-02-05 DIAGNOSIS — Z90710 Acquired absence of both cervix and uterus: Secondary | ICD-10-CM

## 2019-02-05 DIAGNOSIS — D500 Iron deficiency anemia secondary to blood loss (chronic): Secondary | ICD-10-CM

## 2019-02-05 DIAGNOSIS — D508 Other iron deficiency anemias: Secondary | ICD-10-CM

## 2019-02-05 DIAGNOSIS — E119 Type 2 diabetes mellitus without complications: Secondary | ICD-10-CM

## 2019-02-05 DIAGNOSIS — E538 Deficiency of other specified B group vitamins: Secondary | ICD-10-CM

## 2019-02-05 NOTE — Progress Notes (Signed)
Patient stated that she has trouble sleeping at night. Patient stated that she feels tired all the time.

## 2019-03-10 ENCOUNTER — Other Ambulatory Visit: Payer: Self-pay | Admitting: Obstetrics and Gynecology

## 2019-04-16 ENCOUNTER — Other Ambulatory Visit: Payer: Self-pay

## 2019-04-16 DIAGNOSIS — Z20822 Contact with and (suspected) exposure to covid-19: Secondary | ICD-10-CM

## 2019-04-17 LAB — NOVEL CORONAVIRUS, NAA: SARS-CoV-2, NAA: NOT DETECTED

## 2019-04-21 ENCOUNTER — Telehealth: Payer: Self-pay | Admitting: Physician Assistant

## 2019-04-21 DIAGNOSIS — H538 Other visual disturbances: Secondary | ICD-10-CM

## 2019-04-21 NOTE — Progress Notes (Signed)
Based on what you shared with me, I feel your condition warrants further evaluation and I recommend that you be seen for a face to face visit. With the severity of your symptoms and blurred/double vision, you need to be seen by a healthcare professional to aid in the proper diagnosis and treatment plan.   Please contact your primary care physician practice to be seen. Many offices offer virtual options to be seen via video if you are not comfortable going in person to a medical facility at this time.  If you do not have a PCP, [facility] offers a free physician referral service available at [phone]. Our trained staff has the experience, knowledge and resources to put you in touch with a physician who is right for you.   You also have the option of a video visit through [email or web address]  If you are having a true medical emergency please call 911.  NOTE: If you entered your credit card information for this eVisit, you will not be charged. You may see a "hold" on your card for the $35 but that hold will drop off and you will not have a charge processed.  Your e-visit answers were reviewed by a board certified advanced clinical practitioner to complete your personal care plan.  Thank you for using e-Visits.

## 2019-05-05 ENCOUNTER — Emergency Department: Payer: Self-pay

## 2019-05-05 ENCOUNTER — Other Ambulatory Visit: Payer: Self-pay

## 2019-05-05 ENCOUNTER — Encounter: Payer: Self-pay | Admitting: Emergency Medicine

## 2019-05-05 ENCOUNTER — Emergency Department
Admission: EM | Admit: 2019-05-05 | Discharge: 2019-05-05 | Disposition: A | Discharge status: Home / Self Care | Payer: Self-pay | Attending: Emergency Medicine | Admitting: Emergency Medicine

## 2019-05-05 DIAGNOSIS — E1165 Type 2 diabetes mellitus with hyperglycemia: Secondary | ICD-10-CM | POA: Insufficient documentation

## 2019-05-05 DIAGNOSIS — U071 COVID-19: Secondary | ICD-10-CM | POA: Insufficient documentation

## 2019-05-05 DIAGNOSIS — R739 Hyperglycemia, unspecified: Secondary | ICD-10-CM

## 2019-05-05 DIAGNOSIS — Z79899 Other long term (current) drug therapy: Secondary | ICD-10-CM | POA: Insufficient documentation

## 2019-05-05 LAB — CBC WITH DIFFERENTIAL/PLATELET
Abs Immature Granulocytes: 0.01 10*3/uL (ref 0.00–0.07)
Basophils Absolute: 0 10*3/uL (ref 0.0–0.1)
Basophils Relative: 0 %
Eosinophils Absolute: 0 10*3/uL (ref 0.0–0.5)
Eosinophils Relative: 0 %
HCT: 45.2 % (ref 36.0–46.0)
Hemoglobin: 14.8 g/dL (ref 12.0–15.0)
Immature Granulocytes: 0 %
Lymphocytes Relative: 24 %
Lymphs Abs: 1.2 10*3/uL (ref 0.7–4.0)
MCH: 25.3 pg — ABNORMAL LOW (ref 26.0–34.0)
MCHC: 32.7 g/dL (ref 30.0–36.0)
MCV: 77.3 fL — ABNORMAL LOW (ref 80.0–100.0)
Monocytes Absolute: 0.4 10*3/uL (ref 0.1–1.0)
Monocytes Relative: 9 %
Neutro Abs: 3.3 10*3/uL (ref 1.7–7.7)
Neutrophils Relative %: 67 %
Platelets: 250 10*3/uL (ref 150–400)
RBC: 5.85 MIL/uL — ABNORMAL HIGH (ref 3.87–5.11)
RDW: 13.9 % (ref 11.5–15.5)
WBC: 4.6 10*3/uL (ref 4.0–10.5)
nRBC: 0 % (ref 0.0–0.2)

## 2019-05-05 LAB — COMPREHENSIVE METABOLIC PANEL
ALT: 24 U/L (ref 0–44)
AST: 30 U/L (ref 15–41)
Albumin: 3.5 g/dL (ref 3.5–5.0)
Alkaline Phosphatase: 82 U/L (ref 38–126)
Anion gap: 17 — ABNORMAL HIGH (ref 5–15)
BUN: 7 mg/dL (ref 6–20)
CO2: 22 mmol/L (ref 22–32)
Calcium: 8.3 mg/dL — ABNORMAL LOW (ref 8.9–10.3)
Chloride: 94 mmol/L — ABNORMAL LOW (ref 98–111)
Creatinine, Ser: 0.75 mg/dL (ref 0.44–1.00)
GFR calc Af Amer: >60 mL/min (ref >60)
GFR calc non Af Amer: >60 mL/min (ref >60)
Glucose, Bld: 395 mg/dL — ABNORMAL HIGH (ref 70–99)
Potassium: 2.8 mmol/L — ABNORMAL LOW (ref 3.5–5.1)
Sodium: 133 mmol/L — ABNORMAL LOW (ref 135–145)
Total Bilirubin: 0.8 mg/dL (ref 0.3–1.2)
Total Protein: 7.8 g/dL (ref 6.5–8.1)

## 2019-05-05 LAB — GLUCOSE, CAPILLARY: Glucose-Capillary: 271 mg/dL — ABNORMAL HIGH (ref 70–99)

## 2019-05-05 MED ORDER — INSULIN ASPART 100 UNIT/ML ~~LOC~~ SOLN
8.0000 [IU] | Freq: Once | SUBCUTANEOUS | Status: AC
  Administered 2019-05-05: 16:00:00 8 [IU] via INTRAVENOUS
  Filled 2019-05-05: qty 1

## 2019-05-05 MED ORDER — HYDROCOD POLST-CPM POLST ER 10-8 MG/5ML PO SUER
5.0000 mL | Freq: Once | ORAL | Status: AC
  Administered 2019-05-05: 5 mL via ORAL
  Filled 2019-05-05: qty 5

## 2019-05-05 MED ORDER — GUAIFENESIN-CODEINE 100-10 MG/5ML PO SOLN: 5.0000 mL | Freq: Four times a day (QID) | ORAL | 0 refills | Status: DC | PRN

## 2019-05-05 MED ORDER — ALBUTEROL SULFATE HFA 108 (90 BASE) MCG/ACT IN AERS: 2.0000 | INHALATION_SPRAY | Freq: Four times a day (QID) | RESPIRATORY_TRACT | 0 refills | Status: DC | PRN

## 2019-05-05 MED ORDER — METFORMIN HCL 1000 MG PO TABS: 1000.0000 mg | ORAL_TABLET | Freq: Two times a day (BID) | ORAL | 2 refills | Status: DC

## 2019-05-05 MED ORDER — SODIUM CHLORIDE 0.9 % IV BOLUS
1000.0000 mL | Freq: Once | INTRAVENOUS | Status: AC
  Administered 2019-05-05: 16:00:00 1000 mL via INTRAVENOUS

## 2019-05-05 MED ORDER — ACETAMINOPHEN 500 MG PO TABS
1000.0000 mg | ORAL_TABLET | Freq: Once | ORAL | Status: AC
  Administered 2019-05-05: 14:00:00 1000 mg via ORAL
  Filled 2019-05-05: qty 2

## 2019-05-05 NOTE — ED Triage Notes (Signed)
diagnosed with COVID last week. Arrives today with c/o worsening SOB and headache.

## 2019-05-05 NOTE — ED Provider Notes (Signed)
[facility] Emergency Department Provider Note  Time seen: 1:45 PM  I have reviewed the triage vital signs and the nursing notes.   HISTORY  Chief Complaint Shortness of Breath   HPI Sharon Marquez is a 48 y.o. female with a past medical history of anemia, diabetes, obesity, noted COVID positive, presents to the emergency department for worsening shortness of breath cough and fatigue.  According to the patient symptoms began 8 days ago tested + 6 days ago.   Patient states her shortness of breath has worsened continues to have a cough and low-grade fever at home.  Patient came for repeat evaluation given continued and worsening shortness of breath.  Currently patient appears well she has mildly tachypneic in the mid 20s however satting 97% on room air.  Past Medical History:  Diagnosis Date  . Anemia   . Blood transfusion without reported diagnosis   . Diabetes mellitus without complication ([place])   . Family history of adverse reaction to anesthesia    "mother has difficulty waking up after surgery ."  . Fibroid uterus 2014  . History of blood transfusion 2014, 2015   for symptomatic anemia  . Synovitis of wrist    left  . Vitamin B deficiency     Patient Active Problem List   Diagnosis Date Noted  . Acute pain of left wrist 05/15/2018  . S/P laparoscopic assisted vaginal hysterectomy ([facility]) 07/15/2017  . Vitamin D deficiency 02/19/2017  . B12 deficiency 10/22/2016  . Morbid obesity ([place]) 08/05/2016  . Abnormal uterine bleeding 08/05/2016  . History of blood transfusion 08/02/2016  . Iron deficiency anemia 07/11/2016  . Fibroid uterus 08/27/2012    Past Surgical History:  Procedure Laterality Date  . ABDOMINAL HYSTERECTOMY    . CHOLECYSTECTOMY    . LAPAROSCOPIC VAGINAL HYSTERECTOMY WITH SALPINGECTOMY Bilateral 07/15/2017   Procedure: LAPAROSCOPIC ASSISTED VAGINAL HYSTERECTOMY WITH BILATERAL SALPINGECTOMY;  Surgeon: Rubie Maid, MD;  Location:  [facility];  Service: Gynecology;  Laterality: Bilateral;  . TENDON TRANSFER Left 06/06/2018   Procedure: Repair / Reconstruction, Open Extensor Carpi Ulnaris Debridement Repair and Stabilization;  Surgeon: Roseanne Kaufman, MD;  Location: [facility];  Service: Orthopedics;  Laterality: Left;  . WRIST ARTHROSCOPY Left 06/06/2018   Procedure: Left wrist arthroscopy with synovectomy;  Surgeon: Roseanne Kaufman, MD;  Location: [facility];  Service: Orthopedics;  Laterality: Left;  90 mins    Prior to Admission medications   Medication Sig Start Date End Date Taking? Authorizing Provider  cetirizine (ZYRTEC) 10 MG tablet TAKE 1 TABLET BY MOUTH EVERY DAY 03/10/19   Rubie Maid, MD    Allergies  Allergen Reactions  . Meloxicam Itching  . Tramadol Itching and Swelling    Family History  Problem Relation Age of Onset  . Hyperthyroidism Mother   . Diabetes Mother   . Hypertension Mother   . Hyperthyroidism Maternal Aunt   . Diabetes Maternal Grandmother   . Diabetes Maternal Uncle   . Diabetes Maternal Grandfather     Social History Social History   Tobacco Use  . Smoking status: Never Smoker  . Smokeless tobacco: Never Used  Substance Use Topics  . Alcohol use: No  . Drug use: No    Review of Systems Constitutional: Continues to have low-grade fever Cardiovascular: Negative for chest pain. Respiratory: Positive for worsening shortness of breath.  Positive for cough. Gastrointestinal: Negative for abdominal pain, vomiting.  Positive for diarrhea. Genitourinary: Negative for urinary compaints Musculoskeletal: Negative  for musculoskeletal complaints Skin: Negative for skin complaints  Neurological: Negative for headache All other ROS negative  ____________________________________________   PHYSICAL EXAM:  VITAL SIGNS: ED Triage Vitals  Enc Vitals Group     BP 05/05/19 1322 120/83     Pulse Rate 05/05/19 1322 (!) 107     Resp 05/05/19 1322  (!) 28     Temp 05/05/19 1322 99.4 F (37.4 C)     Temp Source 05/05/19 1322 Oral     SpO2 05/05/19 1340 97 %     Weight 05/05/19 1320 264 lb 15.9 oz (120.2 kg)     Height --      Head Circumference --      Peak Flow --      Pain Score 05/05/19 1319 9     Pain Loc --      Pain Edu? --      Excl. in [place]? --    Constitutional: Alert and oriented. Well appearing and in no distress. Eyes: Normal exam ENT      Head: Normocephalic and atraumatic.      Mouth/Throat: Mucous membranes are moist. Cardiovascular: Normal rate, regular rhythm around 100 bpm.  No obvious murmur. Respiratory: Mild tachypnea in the mid 20s.  Lung sounds are clear bilaterally without wheeze rales or rhonchi. Gastrointestinal: Soft and nontender. No distention.  Musculoskeletal: Nontender with normal range of motion in all extremities. Neurologic:  Normal speech and language. No gross focal neurologic deficits  Skin:  Skin is warm, dry and intact.  Psychiatric: Mood and affect are normal.   ____________________________________________    EKG  Chest x-ray shows no acute abnormality.  ____________________________________________    RADIOLOGY  Bibasilar atelectasis no acute abnormality  ____________________________________________   INITIAL IMPRESSION / ASSESSMENT AND PLAN / ED COURSE  Pertinent labs & imaging results that were available during my care of the patient were reviewed by me and considered in my medical decision making (see chart for details).   Patient presents to the emergency department for worsening shortness of breath fatigue.  Patient is day 8 of COVID symptoms.  We will obtain a chest x-ray, labs and continue to closely monitor.  We will dose Tussionex and Tylenol while awaiting results.  Patient's labs are resulted showing an elevated blood glucose otherwise largely within normal limits.  Patient states she stopped taking her metformin several months ago because she ran out.  We will  IV hydrate the patient and treat with insulin but her elevated anion gap.  Once the patient's blood glucose has decreased we will discharged with cough medication, albuterol and metformin.  I discussed PCP follow-up as well as return precautions.  Patient agreeable.  Sharon Marquez was evaluated in Emergency Department on 05/05/2019 for the symptoms described in the history of present illness. She was evaluated in the context of the global COVID-19 pandemic, which necessitated consideration that the patient might be at risk for infection with the SARS-CoV-2 virus that causes COVID-19. Institutional protocols and algorithms that pertain to the evaluation of patients at risk for COVID-19 are in a state of rapid change based on information released by regulatory bodies including the CDC and federal and state organizations. These policies and algorithms were followed during the patient's care in the ED.  ____________________________________________   FINAL CLINICAL IMPRESSION(S) / ED DIAGNOSES  COVID-19   Harvest Dark, MD 05/05/19 1517

## 2019-05-23 ENCOUNTER — Other Ambulatory Visit: Payer: Self-pay | Admitting: General Practice

## 2019-05-23 DIAGNOSIS — Z20822 Contact with and (suspected) exposure to covid-19: Secondary | ICD-10-CM

## 2019-05-24 LAB — NOVEL CORONAVIRUS, NAA: SARS-CoV-2, NAA: NOT DETECTED

## 2022-02-24 ENCOUNTER — Encounter: Payer: Self-pay | Admitting: Oncology

## 2022-02-24 ENCOUNTER — Emergency Department: Payer: BLUE CROSS/BLUE SHIELD

## 2022-02-24 ENCOUNTER — Encounter: Payer: Self-pay | Admitting: Emergency Medicine

## 2022-02-24 ENCOUNTER — Other Ambulatory Visit: Payer: Self-pay

## 2022-02-24 ENCOUNTER — Emergency Department
Admission: EM | Admit: 2022-02-24 | Discharge: 2022-02-24 | Disposition: A | Discharge status: Home / Self Care | Payer: BLUE CROSS/BLUE SHIELD | Attending: Emergency Medicine | Admitting: Emergency Medicine

## 2022-02-24 DIAGNOSIS — E119 Type 2 diabetes mellitus without complications: Secondary | ICD-10-CM | POA: Diagnosis not present

## 2022-02-24 DIAGNOSIS — J40 Bronchitis, not specified as acute or chronic: Secondary | ICD-10-CM | POA: Insufficient documentation

## 2022-02-24 DIAGNOSIS — J208 Acute bronchitis due to other specified organisms: Secondary | ICD-10-CM

## 2022-02-24 DIAGNOSIS — R509 Fever, unspecified: Secondary | ICD-10-CM | POA: Diagnosis present

## 2022-02-24 MED ORDER — ALBUTEROL SULFATE HFA 108 (90 BASE) MCG/ACT IN AERS: 1.0000 | INHALATION_SPRAY | Freq: Four times a day (QID) | RESPIRATORY_TRACT | 0 refills | Status: AC | PRN

## 2022-02-24 MED ORDER — PREDNISONE 10 MG PO TABS: 10.0000 mg | ORAL_TABLET | Freq: Every day | ORAL | 0 refills | Status: DC

## 2022-02-24 NOTE — ED Provider Notes (Signed)
[facility] Provider Note    Event Date/Time   First MD Initiated Contact with Patient 02/24/22 1049     (approximate)   History   Cough and Fever   HPI  Sharon Marquez is a 51 y.o. female with a history of DM 2 presents to the ER today with complaint of runny nose, ear fullness, scratchy throat, cough and shortness of breath.  She reports this started 2 weeks ago.  She is blowing clear mucus out of her nose.  She denies ear pain, drainage or loss of hearing.  She denies any difficulty swallowing.  The cough is productive of green mucus.  She denies headache, chest pain, nausea, vomiting, diarrhea, fever, chills or body aches.  She reports she was seen by her PCP 1 week ago for the same.  Strep test was negative.  Flu test was negative.  COVID test was negative.  She was diagnosed with a viral URI and given Tessalon which she has been taking with minimal relief of symptoms.  She has not had sick contacts that she is aware of but she did recently travel to [place] in the [place].  She has no history of allergies or asthma.      Physical Exam   Triage Vital Signs: ED Triage Vitals  Enc Vitals Group     BP 02/24/22 1038 (!) 113/93     Pulse Rate 02/24/22 1038 88     Resp 02/24/22 1038 20     Temp 02/24/22 1038 98 F (36.7 C)     Temp Source 02/24/22 1038 Oral     SpO2 02/24/22 1038 98 %     Weight 02/24/22 1037 264 lb 8.8 oz (120 kg)     Height 02/24/22 1037 '5\' 3"'$  (1.6 m)     Head Circumference --      Peak Flow --      Pain Score 02/24/22 1037 0     Pain Loc --      Pain Edu? --      Excl. in [place]? --     Most recent vital signs: Vitals:   02/24/22 1038  BP: (!) 113/93  Pulse: 88  Resp: 20  Temp: 98 F (36.7 C)  SpO2: 98%     General: Awake, no distress.  ENT:  No sinus tenderness noted.  Sclera white, conjunctiva pink.  EOMs intact. CV:  RRR. Resp:  Normal effort.  Coarse vesicular breath sounds with intermittent expiratory wheeze.   No rales or rhonchi noted.    ED Results / Procedures / Treatments    RADIOLOGY  Imaging Orders         DG Chest 2 View    IMPRESSION: No acute cardiopulmonary process.    MEDICATIONS ORDERED IN ED: Medications - No data to display   IMPRESSION / MDM / [place] / ED COURSE  I reviewed the triage vital signs and the nursing notes.  Runny Nose, Ear Fullness, Post Nasal Drip, Cough and SOB  Differential diagnosis includes, but is not limited to, viral URI with cough, viral sinusitis, bacterial sinusitis, acute viral bronchitis, bacterial bronchitis, community-acquired pneumonia  Patient's presentation is most consistent with acute, uncomplicated illness.  Chest x-ray does not show any evidence of infiltrates per my interpretation, confirmed by radiology She has had strep, flu and COVID testing which was negative performed by her PCP, no indication to repeat that at this time. Exam consistent with viral bronchitis Rx for Prednisone 10  mg daily x5 days Rx for Albuterol 1 to 2 puffs every 4-6 hours as needed Continue Tessalon as previously prescribed She will follow-up with her PCP if symptoms persist or worsen  FINAL CLINICAL IMPRESSION(S) / ED DIAGNOSES   Final diagnoses:  Viral bronchitis     Rx / DC Orders   ED Discharge Orders          Ordered    predniSONE (DELTASONE) 10 MG tablet  Daily        02/24/22 1134    albuterol (VENTOLIN HFA) 108 (90 Base) MCG/ACT inhaler  Every 6 hours PRN        02/24/22 1134             Note:  This document was prepared using Dragon voice recognition software and may include unintentional dictation errors.    Jearld Fenton, NP 02/24/22 1135    Naaman Plummer, MD 02/24/22 1438

## 2022-02-24 NOTE — ED Triage Notes (Signed)
Pt reports productive cough for 2 weeks. Pt reports phlem is greenish yellow in color. Pt reports intermittent fevers as well. Last fever was yesterday. Pt reports when she coughs it hurts her chest. Pt states just got back from a trip to the [place].

## 2022-02-24 NOTE — ED Notes (Signed)
See triage note. Pt complains of productive cough after trip to [place].

## 2022-02-24 NOTE — Discharge Instructions (Addendum)
You were seen today for URI symptoms.  Your chest x-ray did not show any evidence of pneumonia.  You previously had a negative strep, flu and COVID test by your PCP which we did not repeat at this time.  I am prescribing you steroids to take daily for the next 5 days.  Please be aware that this may increase your blood sugar.  I am also refilling your inhaler that you may take 1 to 2 puffs every 4-6 hours as needed.  You may continue the Tessalon which was previously prescribed.  Please follow-up with your PCP if symptoms persist or worsen.

## 2022-04-20 ENCOUNTER — Other Ambulatory Visit: Payer: Self-pay

## 2022-04-20 ENCOUNTER — Emergency Department: Payer: 59

## 2022-04-20 ENCOUNTER — Emergency Department
Admission: EM | Admit: 2022-04-20 | Discharge: 2022-04-20 | Disposition: A | Discharge status: Home / Self Care | Payer: 59 | Attending: Emergency Medicine | Admitting: Emergency Medicine

## 2022-04-20 DIAGNOSIS — R0789 Other chest pain: Secondary | ICD-10-CM | POA: Insufficient documentation

## 2022-04-20 DIAGNOSIS — R0602 Shortness of breath: Secondary | ICD-10-CM | POA: Diagnosis not present

## 2022-04-20 LAB — CBC WITH DIFFERENTIAL/PLATELET
Abs Immature Granulocytes: 0.01 10*3/uL (ref 0.00–0.07)
Basophils Absolute: 0 10*3/uL (ref 0.0–0.1)
Basophils Relative: 1 %
Eosinophils Absolute: 0 10*3/uL (ref 0.0–0.5)
Eosinophils Relative: 0 %
HCT: 43.5 % (ref 36.0–46.0)
Hemoglobin: 14 g/dL (ref 12.0–15.0)
Immature Granulocytes: 0 %
Lymphocytes Relative: 44 %
Lymphs Abs: 1.9 10*3/uL (ref 0.7–4.0)
MCH: 29.3 pg (ref 26.0–34.0)
MCHC: 32.2 g/dL (ref 30.0–36.0)
MCV: 91 fL (ref 80.0–100.0)
Monocytes Absolute: 0.3 10*3/uL (ref 0.1–1.0)
Monocytes Relative: 8 %
Neutro Abs: 2.1 10*3/uL (ref 1.7–7.7)
Neutrophils Relative %: 47 %
Platelets: 228 10*3/uL (ref 150–400)
RBC: 4.78 MIL/uL (ref 3.87–5.11)
RDW: 14 % (ref 11.5–15.5)
WBC: 4.4 10*3/uL (ref 4.0–10.5)
nRBC: 0 % (ref 0.0–0.2)

## 2022-04-20 LAB — COMPREHENSIVE METABOLIC PANEL
ALT: 27 U/L (ref 0–44)
AST: 26 U/L (ref 15–41)
Albumin: 4 g/dL (ref 3.5–5.0)
Alkaline Phosphatase: 80 U/L (ref 38–126)
Anion gap: 11 (ref 5–15)
BUN: 12 mg/dL (ref 6–20)
CO2: 23 mmol/L (ref 22–32)
Calcium: 9.1 mg/dL (ref 8.9–10.3)
Chloride: 102 mmol/L (ref 98–111)
Creatinine, Ser: 0.61 mg/dL (ref 0.44–1.00)
GFR, Estimated: >60 mL/min (ref >60)
Glucose, Bld: 337 mg/dL — ABNORMAL HIGH (ref 70–99)
Potassium: 3.5 mmol/L (ref 3.5–5.1)
Sodium: 136 mmol/L (ref 135–145)
Total Bilirubin: 0.6 mg/dL (ref 0.3–1.2)
Total Protein: 7.9 g/dL (ref 6.5–8.1)

## 2022-04-20 LAB — TROPONIN I (HIGH SENSITIVITY)
Troponin I (High Sensitivity): 3 ng/L (ref <18)
Troponin I (High Sensitivity): 4 ng/L (ref <18)

## 2022-04-20 LAB — D-DIMER, QUANTITATIVE: D-Dimer, Quant: 0.32 ug/mL-FEU (ref 0.00–0.50)

## 2022-04-20 MED ORDER — IPRATROPIUM-ALBUTEROL 0.5-2.5 (3) MG/3ML IN SOLN
3.0000 mL | Freq: Once | RESPIRATORY_TRACT | Status: AC
  Administered 2022-04-20: 3 mL via RESPIRATORY_TRACT
  Filled 2022-04-20: qty 3

## 2022-04-20 NOTE — Discharge Instructions (Signed)
Please seek medical attention for any high fevers, chest pain, shortness of breath, change in behavior, persistent vomiting, bloody stool or any other new or concerning symptoms.  

## 2022-04-20 NOTE — ED Triage Notes (Signed)
Pt presents to ED with c/o of chest tightness for the past 2 days. Pt states her work MD sent her. Pt denies cardiac HX.

## 2022-04-20 NOTE — ED Provider Notes (Signed)
[facility] Provider Note    Event Date/Time   First MD Initiated Contact with Patient 04/20/22 1637     (approximate)   History   Chest Pain   HPI  ALIZEY NOREN is a 51 y.o. female who presents to the emergency department today because of concerns for chest tightness and shortness of breath.  The symptoms have been present for the past few days.  Today they have been fairly constant.  She says that she recently went to her doctor who noticed some swelling in her legs.  Patient denies any pain in her legs.  Denies any recent travel.  Denies any fevers.  Denies similar symptoms in the past.     Physical Exam   Triage Vital Signs: ED Triage Vitals  Enc Vitals Group     BP 04/20/22 1458 (!) 108/90     Pulse Rate 04/20/22 1458 81     Resp 04/20/22 1458 16     Temp 04/20/22 1458 98.2 F (36.8 C)     Temp Source 04/20/22 1458 Oral     SpO2 04/20/22 1458 100 %     Weight 04/20/22 1458 233 lb (105.7 kg)     Height 04/20/22 1458 '5\' 3"'$  (1.6 m)     Head Circumference --      Peak Flow --      Pain Score 04/20/22 1510 9     Pain Loc --      Pain Edu? --      Excl. in [place]? --     Most recent vital signs: Vitals:   04/20/22 1458  BP: (!) 108/90  Pulse: 81  Resp: 16  Temp: 98.2 F (36.8 C)  SpO2: 100%    General: Awake, alert, oriented. CV:  Good peripheral perfusion. Regular rate and rhythm. Resp:  Normal effort. Lungs clear. Abd:  No distention. Non tender.   ED Results / Procedures / Treatments   Labs (all labs ordered are listed, but only abnormal results are displayed) Labs Reviewed  COMPREHENSIVE METABOLIC PANEL - Abnormal; Notable for the following components:      Result Value   Glucose, Bld 337 (*)    All other components within normal limits  CBC WITH DIFFERENTIAL/PLATELET  D-DIMER, QUANTITATIVE  TROPONIN I (HIGH SENSITIVITY)  TROPONIN I (HIGH SENSITIVITY)     EKG  I, Nance Pear, attending physician, personally  viewed and interpreted this EKG  EKG Time: 1458 Rate: 90 Rhythm: normal sinus rhythm Axis: left axis deviation Intervals: qtc 484 QRS: narrow ST changes: no st elevation Impression: abnormal ekg   RADIOLOGY I independently interpreted and visualized the CXR. My interpretation: No pneumonia. No pneumothorax.  Radiology interpretation:  IMPRESSION:  No active cardiopulmonary disease.      PROCEDURES:  Critical Care performed: No  Procedures   MEDICATIONS ORDERED IN ED: Medications - No data to display   IMPRESSION / MDM / [place] / ED COURSE  I reviewed the triage vital signs and the nursing notes.                              Differential diagnosis includes, but is not limited to, pneumonia, pneumothorax, viral illness, PE, ACS.  Patient's presentation is most consistent with acute presentation with potential threat to life or bodily function.  Patient presented to the emergency department today because of concerns for chest tightness and shortness of breath.  On exam  patient no respiratory distress.  Lungs were clear.  Chest x-ray without findings concerning for pneumonia or pneumothorax.  Troponins were negative x2.  D-dimer was also negative.  Patient was given DuoNeb treatment and stated that she did feel some improvement.  She states she has an albuterol inhaler at home but had not tried you noticing it today.  At this time I do think he likely suffering from from possible viral illness causing some reactive airway disease.  Discussed this with the patient.  Given reassuring blood work and vital signs I do not think patient necessitates inpatient mission at this time. Encouraged close primary care follow up and return precautions.   FINAL CLINICAL IMPRESSION(S) / ED DIAGNOSES   Final diagnoses:  Chest tightness     Note:  This document was prepared using Dragon voice recognition software and may include unintentional dictation errors.    Nance Pear, MD 04/20/22 [phone]

## 2022-04-20 NOTE — ED Notes (Signed)
First Nurse Note: Pt to ED via POV for chest tightness. Pt is in NAD.

## 2022-04-20 NOTE — ED Notes (Signed)
Writer received report from [employer]. Writer then assessed patient and found her to continue to have chest pain since arrival. Pt placed on cardiac monitor and vital signs obtained. Provider aware of continued chest pain. Assessment charted.

## 2022-11-26 DIAGNOSIS — R0789 Other chest pain: Secondary | ICD-10-CM

## 2022-11-26 HISTORY — DX: Other chest pain: R07.89

## 2022-12-08 ENCOUNTER — Other Ambulatory Visit: Payer: Self-pay

## 2022-12-08 ENCOUNTER — Emergency Department: Payer: BLUE CROSS/BLUE SHIELD

## 2022-12-08 ENCOUNTER — Observation Stay
Admission: EM | Admit: 2022-12-08 | Discharge: 2022-12-11 | Disposition: A | Discharge status: Home / Self Care | Payer: BLUE CROSS/BLUE SHIELD | Attending: Family Medicine | Admitting: Family Medicine

## 2022-12-08 ENCOUNTER — Encounter: Payer: Self-pay | Admitting: Oncology

## 2022-12-08 DIAGNOSIS — R079 Chest pain, unspecified: Secondary | ICD-10-CM | POA: Diagnosis not present

## 2022-12-08 DIAGNOSIS — Z7984 Long term (current) use of oral hypoglycemic drugs: Secondary | ICD-10-CM | POA: Diagnosis not present

## 2022-12-08 DIAGNOSIS — K529 Noninfective gastroenteritis and colitis, unspecified: Secondary | ICD-10-CM | POA: Diagnosis not present

## 2022-12-08 DIAGNOSIS — E876 Hypokalemia: Secondary | ICD-10-CM | POA: Insufficient documentation

## 2022-12-08 DIAGNOSIS — R112 Nausea with vomiting, unspecified: Secondary | ICD-10-CM | POA: Diagnosis present

## 2022-12-08 DIAGNOSIS — R0789 Other chest pain: Principal | ICD-10-CM | POA: Insufficient documentation

## 2022-12-08 DIAGNOSIS — E669 Obesity, unspecified: Secondary | ICD-10-CM | POA: Diagnosis present

## 2022-12-08 DIAGNOSIS — E785 Hyperlipidemia, unspecified: Secondary | ICD-10-CM | POA: Diagnosis present

## 2022-12-08 DIAGNOSIS — R197 Diarrhea, unspecified: Secondary | ICD-10-CM | POA: Diagnosis not present

## 2022-12-08 DIAGNOSIS — Z1152 Encounter for screening for COVID-19: Secondary | ICD-10-CM | POA: Insufficient documentation

## 2022-12-08 DIAGNOSIS — Z79899 Other long term (current) drug therapy: Secondary | ICD-10-CM | POA: Diagnosis not present

## 2022-12-08 DIAGNOSIS — E119 Type 2 diabetes mellitus without complications: Secondary | ICD-10-CM | POA: Diagnosis not present

## 2022-12-08 HISTORY — DX: Anemia, unspecified: D64.9

## 2022-12-08 LAB — BASIC METABOLIC PANEL
Anion gap: 13 (ref 5–15)
BUN: 15 mg/dL (ref 6–20)
CO2: 26 mmol/L (ref 22–32)
Calcium: 9.2 mg/dL (ref 8.9–10.3)
Chloride: 100 mmol/L (ref 98–111)
Creatinine, Ser: 0.85 mg/dL (ref 0.44–1.00)
GFR, Estimated: >60 mL/min (ref >60)
Glucose, Bld: 278 mg/dL — ABNORMAL HIGH (ref 70–99)
Potassium: 3 mmol/L — ABNORMAL LOW (ref 3.5–5.1)
Sodium: 139 mmol/L (ref 135–145)

## 2022-12-08 LAB — MAGNESIUM: Magnesium: 1.6 mg/dL — ABNORMAL LOW (ref 1.7–2.4)

## 2022-12-08 LAB — GASTROINTESTINAL PANEL BY PCR, STOOL (REPLACES STOOL CULTURE)

## 2022-12-08 LAB — URINALYSIS, ROUTINE W REFLEX MICROSCOPIC
Bilirubin Urine: NEGATIVE
Glucose, UA: NEGATIVE mg/dL
Hgb urine dipstick: NEGATIVE
Ketones, ur: NEGATIVE mg/dL
Nitrite: NEGATIVE
Protein, ur: 100 mg/dL — AB
Specific Gravity, Urine: 1.021 (ref 1.005–1.030)
pH: 5 (ref 5.0–8.0)

## 2022-12-08 LAB — CBC
HCT: 39.3 % (ref 36.0–46.0)
Hemoglobin: 12.5 g/dL (ref 12.0–15.0)
MCH: 28.6 pg (ref 26.0–34.0)
MCHC: 31.8 g/dL (ref 30.0–36.0)
MCV: 89.9 fL (ref 80.0–100.0)
Platelets: 304 10*3/uL (ref 150–400)
RBC: 4.37 MIL/uL (ref 3.87–5.11)
RDW: 15.2 % (ref 11.5–15.5)
WBC: 4.7 10*3/uL (ref 4.0–10.5)
nRBC: 0 % (ref 0.0–0.2)

## 2022-12-08 LAB — RESP PANEL BY RT-PCR (RSV, FLU A&B, COVID)  RVPGX2
Influenza A by PCR: NEGATIVE
Influenza B by PCR: NEGATIVE
Resp Syncytial Virus by PCR: NEGATIVE
SARS Coronavirus 2 by RT PCR: NEGATIVE

## 2022-12-08 LAB — URINE DRUG SCREEN, QUALITATIVE (ARMC ONLY)
Amphetamines, Ur Screen: NOT DETECTED
Barbiturates, Ur Screen: NOT DETECTED
Benzodiazepine, Ur Scrn: NOT DETECTED
Cannabinoid 50 Ng, Ur ~~LOC~~: NOT DETECTED
Cocaine Metabolite,Ur ~~LOC~~: NOT DETECTED
MDMA (Ecstasy)Ur Screen: NOT DETECTED
Methadone Scn, Ur: NOT DETECTED
Opiate, Ur Screen: POSITIVE — AB
Phencyclidine (PCP) Ur S: NOT DETECTED
Tricyclic, Ur Screen: NOT DETECTED

## 2022-12-08 LAB — C DIFFICILE QUICK SCREEN W PCR REFLEX
C Diff antigen: NEGATIVE
C Diff interpretation: NOT DETECTED
C Diff toxin: NEGATIVE

## 2022-12-08 LAB — URINALYSIS, W/ REFLEX TO CULTURE (INFECTION SUSPECTED)
Bilirubin Urine: NEGATIVE
Glucose, UA: NEGATIVE mg/dL
Hgb urine dipstick: NEGATIVE
Ketones, ur: NEGATIVE mg/dL
Nitrite: NEGATIVE
Protein, ur: 100 mg/dL — AB
Specific Gravity, Urine: 1.023 (ref 1.005–1.030)
pH: 5 (ref 5.0–8.0)

## 2022-12-08 LAB — HEMOGLOBIN A1C
Hgb A1c MFr Bld: 8 % — ABNORMAL HIGH (ref 4.8–5.6)
Mean Plasma Glucose: 182.9 mg/dL

## 2022-12-08 LAB — HEPATIC FUNCTION PANEL
ALT: 20 U/L (ref 0–44)
AST: 21 U/L (ref 15–41)
Albumin: 3.9 g/dL (ref 3.5–5.0)
Alkaline Phosphatase: 76 U/L (ref 38–126)
Bilirubin, Direct: <0.1 mg/dL (ref 0.0–0.2)
Total Bilirubin: 0.5 mg/dL (ref 0.3–1.2)
Total Protein: 7.9 g/dL (ref 6.5–8.1)

## 2022-12-08 LAB — CBG MONITORING, ED
Glucose-Capillary: 107 mg/dL — ABNORMAL HIGH (ref 70–99)
Glucose-Capillary: 150 mg/dL — ABNORMAL HIGH (ref 70–99)
Glucose-Capillary: 192 mg/dL — ABNORMAL HIGH (ref 70–99)

## 2022-12-08 LAB — SEDIMENTATION RATE: Sed Rate: 30 mm/hr (ref 0–30)

## 2022-12-08 LAB — TROPONIN I (HIGH SENSITIVITY)
Troponin I (High Sensitivity): 4 ng/L (ref <18)
Troponin I (High Sensitivity): 5 ng/L (ref <18)
Troponin I (High Sensitivity): 6 ng/L (ref <18)
Troponin I (High Sensitivity): 4 ng/L (ref <18)

## 2022-12-08 LAB — HIV ANTIBODY (ROUTINE TESTING W REFLEX): HIV Screen 4th Generation wRfx: NONREACTIVE

## 2022-12-08 LAB — PHOSPHORUS: Phosphorus: 2.7 mg/dL (ref 2.5–4.6)

## 2022-12-08 LAB — PROTIME-INR
INR: 1.2 (ref 0.8–1.2)
Prothrombin Time: 14.8 seconds (ref 11.4–15.2)

## 2022-12-08 LAB — LIPASE, BLOOD: Lipase: 23 U/L (ref 11–51)

## 2022-12-08 LAB — C-REACTIVE PROTEIN: CRP: 0.8 mg/dL (ref <1.0)

## 2022-12-08 LAB — APTT: aPTT: 25 seconds (ref 24–36)

## 2022-12-08 MED ORDER — ASPIRIN 81 MG PO TBEC
81.0000 mg | DELAYED_RELEASE_TABLET | Freq: Every day | ORAL | Status: DC
  Administered 2022-12-09 – 2022-12-11 (×3): 81 mg via ORAL
  Filled 2022-12-08 (×3): qty 1

## 2022-12-08 MED ORDER — ONDANSETRON HCL 4 MG/2ML IJ SOLN: 4.0000 mg | Freq: Once | INTRAMUSCULAR | Status: DC | PRN

## 2022-12-08 MED ORDER — ACETAMINOPHEN 325 MG PO TABS
650.0000 mg | ORAL_TABLET | Freq: Four times a day (QID) | ORAL | Status: DC | PRN
  Administered 2022-12-10 – 2022-12-11 (×2): 650 mg via ORAL
  Filled 2022-12-08 (×2): qty 2

## 2022-12-08 MED ORDER — POTASSIUM CHLORIDE CRYS ER 20 MEQ PO TBCR
40.0000 meq | EXTENDED_RELEASE_TABLET | Freq: Once | ORAL | Status: AC
  Administered 2022-12-08: 40 meq via ORAL
  Filled 2022-12-08: qty 2

## 2022-12-08 MED ORDER — ASPIRIN 81 MG PO CHEW
324.0000 mg | CHEWABLE_TABLET | Freq: Once | ORAL | Status: AC
  Administered 2022-12-08: 324 mg via ORAL
  Filled 2022-12-08: qty 4

## 2022-12-08 MED ORDER — SODIUM CHLORIDE 0.9 % IV SOLN: INTRAVENOUS | Status: DC

## 2022-12-08 MED ORDER — FENTANYL CITRATE PF 50 MCG/ML IJ SOSY
25.0000 ug | PREFILLED_SYRINGE | INTRAMUSCULAR | Status: DC | PRN
  Administered 2022-12-08: 25 ug via INTRAVENOUS
  Filled 2022-12-08: qty 1

## 2022-12-08 MED ORDER — ALBUTEROL SULFATE (2.5 MG/3ML) 0.083% IN NEBU
3.0000 mL | INHALATION_SOLUTION | RESPIRATORY_TRACT | Status: DC | PRN
  Administered 2022-12-09: 3 mL via RESPIRATORY_TRACT
  Filled 2022-12-08: qty 3

## 2022-12-08 MED ORDER — INSULIN ASPART 100 UNIT/ML IJ SOLN
0.0000 [IU] | Freq: Every day | INTRAMUSCULAR | Status: DC
  Administered 2022-12-10: 2 [IU] via SUBCUTANEOUS
  Filled 2022-12-08: qty 1

## 2022-12-08 MED ORDER — IOHEXOL 350 MG/ML SOLN
100.0000 mL | Freq: Once | INTRAVENOUS | Status: AC | PRN
  Administered 2022-12-08: 100 mL via INTRAVENOUS

## 2022-12-08 MED ORDER — INSULIN ASPART 100 UNIT/ML IJ SOLN
0.0000 [IU] | Freq: Three times a day (TID) | INTRAMUSCULAR | Status: DC
  Administered 2022-12-08: 1 [IU] via SUBCUTANEOUS
  Administered 2022-12-09 (×2): 2 [IU] via SUBCUTANEOUS
  Administered 2022-12-10: 3 [IU] via SUBCUTANEOUS
  Administered 2022-12-10 – 2022-12-11 (×3): 2 [IU] via SUBCUTANEOUS
  Filled 2022-12-08 (×7): qty 1

## 2022-12-08 MED ORDER — ONDANSETRON HCL 4 MG/2ML IJ SOLN
4.0000 mg | Freq: Three times a day (TID) | INTRAMUSCULAR | Status: DC | PRN
  Administered 2022-12-09: 4 mg via INTRAVENOUS
  Filled 2022-12-08: qty 2

## 2022-12-08 MED ORDER — ATORVASTATIN CALCIUM 20 MG PO TABS: 40.0000 mg | ORAL_TABLET | Freq: Every day | ORAL | Status: DC

## 2022-12-08 MED ORDER — ONDANSETRON 4 MG PO TBDP
4.0000 mg | ORAL_TABLET | Freq: Once | ORAL | Status: AC
  Administered 2022-12-08: 4 mg via ORAL
  Filled 2022-12-08: qty 1

## 2022-12-08 MED ORDER — ATORVASTATIN CALCIUM 20 MG PO TABS
40.0000 mg | ORAL_TABLET | Freq: Every day | ORAL | Status: DC
  Administered 2022-12-08 – 2022-12-11 (×4): 40 mg via ORAL
  Filled 2022-12-08 (×4): qty 2

## 2022-12-08 MED ORDER — NITROGLYCERIN 0.4 MG SL SUBL: 0.4000 mg | SUBLINGUAL_TABLET | SUBLINGUAL | Status: DC | PRN

## 2022-12-08 MED ORDER — MAGNESIUM SULFATE 2 GM/50ML IV SOLN
2.0000 g | Freq: Once | INTRAVENOUS | Status: AC
  Administered 2022-12-08: 2 g via INTRAVENOUS
  Filled 2022-12-08: qty 50

## 2022-12-08 MED ORDER — INSULIN GLARGINE-YFGN 100 UNIT/ML ~~LOC~~ SOLN
30.0000 [IU] | Freq: Every day | SUBCUTANEOUS | Status: DC
  Administered 2022-12-09 – 2022-12-10 (×3): 30 [IU] via SUBCUTANEOUS
  Filled 2022-12-08 (×4): qty 0.3

## 2022-12-08 MED ORDER — ALBUTEROL SULFATE HFA 108 (90 BASE) MCG/ACT IN AERS: 1.0000 | INHALATION_SPRAY | Freq: Four times a day (QID) | RESPIRATORY_TRACT | Status: DC | PRN

## 2022-12-08 MED ORDER — ENOXAPARIN SODIUM 60 MG/0.6ML IJ SOSY
0.5000 mg/kg | PREFILLED_SYRINGE | INTRAMUSCULAR | Status: DC
  Administered 2022-12-08 – 2022-12-10 (×3): 50 mg via SUBCUTANEOUS
  Filled 2022-12-08 (×3): qty 0.6

## 2022-12-08 MED ORDER — GUAIFENESIN-CODEINE 100-10 MG/5ML PO SOLN
5.0000 mL | Freq: Four times a day (QID) | ORAL | Status: DC | PRN
  Administered 2022-12-10 (×2): 5 mL via ORAL
  Filled 2022-12-08 (×2): qty 5

## 2022-12-08 NOTE — ED Notes (Signed)
Patient able to tolerate PO. MD notified

## 2022-12-08 NOTE — H&P (Signed)
History and Physical    Sharon Marquez ZOX:[id number] DOB: April 02, 1971 DOA: 12/08/2022  Referring MD/NP/PA:   PCP: [facility]   Patient coming from:  The patient is coming from home.   Chief Complaint: chest pain and nausea, vomiting, diarrhea  HPI: Sharon Marquez is a 52 y.o. female with medical history significant of DM, anemia, obesity obesity BMI 38.62, vitamin B12 deficiency, fibroids uterus (s/p of hysterectomy), who presents with chest pain, nausea, vomiting and diarrhea.  Patient states that she has nausea, vomiting, diarrhea for 5 days.  She also has subjective fever and chills.  Her body temperature is 98.6 in ED today.  She states that she has at least 5 times of nonbilious nonbloody vomiting each day, and 4 times of watery diarrhea each day.  She  has central abdominal cramping.  No blood in stool.  Patient states she has chest pain, which is located in the front chest, constant, pressure-like, nonradiating, 9 out of 10 in severity, not aggravated or alleviated by any known factors.  It is associated with shortness breath.  She has dry cough and runny nose.  She denies dysuria or burning or urination, but states that her blood sugar is elevated, causing increased urinary frequency.  No hematuria.  Patient states that she stopped taking phentermine 5 days ago because she thought that this was contributing to her chest pain.  Data reviewed independently and ED Course: pt was found to have trop 4 --> 5, negative PCR for COVID, flu and RSV, GFR> 60, potassium 3.0, urinalysis with squamous cell contamination (cloudy appearance, small amount leukocyte, rare bacteria, WBC 6-10), temperature normal, blood pressure 131/84, heart rate 103, 83, RR 20, oxygen saturation 100% on room air.  Chest x-ray negative.  CT of abdomen/pelvis is negative for SBO, but showed mild enteritis.  CTA negative for PE, does showed possible PE hypertension.  Patient is placed on telemetry bed for  obs. Dr. Duke Salvia of cardiology is consulted.  EKG: I have personally reviewed.  Sinus rhythm, QTc 483, LAE, LAD, poor IV progression, occasional PVC   Review of Systems:   General: Has subjective fevers, chills, no body weight gain, has poor appetite, has fatigue HEENT: no blurry vision, hearing changes or sore throat Respiratory: has dyspnea, coughing, no wheezing CV: has chest pain, no palpitations GI: has nausea, vomiting, abdominal cramping pain, diarrhea GU: no dysuria, burning on urination, has increased urinary frequency, no hematuria  Ext: no leg edema Neuro: no unilateral weakness, numbness, or tingling, no vision change or hearing loss Skin: no rash, no skin tear. MSK: No muscle spasm, no deformity, no limitation of range of movement in spin Heme: No easy bruising.  Travel history: No recent long distant travel.   Allergy:  Allergies  Allergen Reactions   Meloxicam Itching   Tramadol Itching and Swelling    Past Medical History:  Diagnosis Date   Anemia    Diabetes mellitus without complication    Family history of adverse reaction to anesthesia    "mother has difficulty waking up after surgery ."   Fibroid uterus 2014   History of blood transfusion 2014, 2015   for symptomatic anemia   Synovitis of wrist    left   Vitamin B deficiency     Past Surgical History:  Procedure Laterality Date   ABDOMINAL HYSTERECTOMY     CHOLECYSTECTOMY     LAPAROSCOPIC VAGINAL HYSTERECTOMY WITH SALPINGECTOMY Bilateral 07/15/2017   Procedure: LAPAROSCOPIC ASSISTED VAGINAL HYSTERECTOMY WITH BILATERAL  SALPINGECTOMY;  Surgeon: Hildred Laser, MD;  Location: [facility];  Service: Gynecology;  Laterality: Bilateral;   TENDON TRANSFER Left 06/06/2018   Procedure: Repair / Reconstruction, Open Extensor Carpi Ulnaris Debridement Repair and Stabilization;  Surgeon: Dominica Severin, MD;  Location: [facility];  Service: Orthopedics;  Laterality: Left;   WRIST ARTHROSCOPY Left  06/06/2018   Procedure: Left wrist arthroscopy with synovectomy;  Surgeon: Dominica Severin, MD;  Location: [facility];  Service: Orthopedics;  Laterality: Left;  90 mins    Social History:  reports that she has never smoked. She has never used smokeless tobacco. She reports that she does not drink alcohol and does not use drugs.  Family History:  Family History  Problem Relation Age of Onset   Hyperthyroidism Mother    Diabetes Mother    Hypertension Mother    Hyperthyroidism Maternal Aunt    Diabetes Maternal Grandmother    Diabetes Maternal Uncle    Diabetes Maternal Grandfather      Prior to Admission medications   Medication Sig Start Date End Date Taking? Authorizing Provider  albuterol (VENTOLIN HFA) 108 (90 Base) MCG/ACT inhaler Inhale 1-2 puffs into the lungs every 6 (six) hours as needed for wheezing or shortness of breath. 02/24/22   Lorre Munroe, NP  cetirizine (ZYRTEC) 10 MG tablet TAKE 1 TABLET BY MOUTH EVERY DAY 03/10/19   Hildred Laser, MD  guaiFENesin-codeine 100-10 MG/5ML syrup Take 5 mLs by mouth every 6 (six) hours as needed for cough. 05/05/19   Minna Antis, MD  metFORMIN (GLUCOPHAGE) 1000 MG tablet Take 1 tablet (1,000 mg total) by mouth 2 (two) times daily with a meal. 05/05/19 08/03/19  Minna Antis, MD  predniSONE (DELTASONE) 10 MG tablet Take 1 tablet (10 mg total) by mouth daily. 02/24/22   Lorre Munroe, NP    Physical Exam: Vitals:   12/08/22 0600 12/08/22 0615 12/08/22 0700 12/08/22 0945  BP: 115/69  131/84   Pulse: 87 92 83   Resp: 18 20 18    Temp:    98.8 F (37.1 C)  TempSrc:    Oral  SpO2: 98% 98% 100%   Weight:      Height:       General: Not in acute distress.  Dry mucous membrane HEENT:       Eyes: PERRL, EOMI, no scleral icterus.       ENT: No discharge from the ears and nose, no pharynx injection, no tonsillar enlargement.        Neck: No JVD, no bruit, no mass felt. Heme: No neck lymph node  enlargement. Cardiac: S1/S2, RRR, No murmurs, No gallops or rubs. Respiratory: No rales, wheezing, rhonchi or rubs. GI: Soft, nondistended, nontender, no rebound pain, no organomegaly, BS present. GU: No hematuria Ext: No pitting leg edema bilaterally. 1+DP/PT pulse bilaterally. Musculoskeletal: No joint deformities, No joint redness or warmth, no limitation of ROM in spin. Skin: No rashes.  Neuro: Alert, oriented X3, cranial nerves II-XII grossly intact, moves all extremities normally.  Psych: Patient is not psychotic, no suicidal or hemocidal ideation.  Labs on Admission: I have personally reviewed following labs and imaging studies  CBC: Recent Labs  Lab 12/08/22 0032  WBC 4.7  HGB 12.5  HCT 39.3  MCV 89.9  PLT 304   Basic Metabolic Panel: Recent Labs  Lab 12/08/22 0032 12/08/22 0833  NA 139  --   K 3.0*  --   CL 100  --   CO2 26  --  GLUCOSE 278*  --   BUN 15  --   CREATININE 0.85  --   CALCIUM 9.2  --   MG  --  1.6*   GFR: Estimated Creatinine Clearance: 87.8 mL/min (by C-G formula based on SCr of 0.85 mg/dL). Liver Function Tests: Recent Labs  Lab 12/08/22 0307  AST 21  ALT 20  ALKPHOS 76  BILITOT 0.5  PROT 7.9  ALBUMIN 3.9   Recent Labs  Lab 12/08/22 0032  LIPASE 23   No results for input(s): "AMMONIA" in the last 168 hours. Coagulation Profile: Recent Labs  Lab 12/08/22 0833  INR 1.2   Cardiac Enzymes: No results for input(s): "CKTOTAL", "CKMB", "CKMBINDEX", "TROPONINI" in the last 168 hours. BNP (last 3 results) No results for input(s): "PROBNP" in the last 8760 hours. HbA1C: No results for input(s): "HGBA1C" in the last 72 hours. CBG: No results for input(s): "GLUCAP" in the last 168 hours. Lipid Profile: No results for input(s): "CHOL", "HDL", "LDLCALC", "TRIG", "CHOLHDL", "LDLDIRECT" in the last 72 hours. Thyroid Function Tests: No results for input(s): "TSH", "T4TOTAL", "FREET4", "T3FREE", "THYROIDAB" in the last 72  hours. Anemia Panel: No results for input(s): "VITAMINB12", "FOLATE", "FERRITIN", "TIBC", "IRON", "RETICCTPCT" in the last 72 hours. Urine analysis:    Component Value Date/Time   COLORURINE YELLOW (A) 12/08/2022 0638   APPEARANCEUR CLOUDY (A) 12/08/2022 0638   LABSPEC 1.021 12/08/2022 0638   PHURINE 5.0 12/08/2022 0638   GLUCOSEU NEGATIVE 12/08/2022 0638   HGBUR NEGATIVE 12/08/2022 0638   BILIRUBINUR NEGATIVE 12/08/2022 0638   KETONESUR NEGATIVE 12/08/2022 0638   PROTEINUR 100 (A) 12/08/2022 0638   NITRITE NEGATIVE 12/08/2022 0638   LEUKOCYTESUR SMALL (A) 12/08/2022 0638   Sepsis Labs: (procalcitonin:4,lacticidven:4) ) Recent Results (from the past 240 hour(s))  Resp panel by RT-PCR (RSV, Flu A&B, Covid) Anterior Nasal Swab     Status: None   Collection Time: 12/08/22 12:32 AM   Specimen: Anterior Nasal Swab  Result Value Ref Range Status   SARS Coronavirus 2 by RT PCR NEGATIVE NEGATIVE Final    Comment: (NOTE) SARS-CoV-2 target nucleic acids are NOT DETECTED.  The SARS-CoV-2 RNA is generally detectable in upper respiratory specimens during the acute phase of infection. The lowest concentration of SARS-CoV-2 viral copies this assay can detect is 138 copies/mL. A negative result does not preclude SARS-Cov-2 infection and should not be used as the sole basis for treatment or other patient management decisions. A negative result may occur with  improper specimen collection/handling, submission of specimen other than nasopharyngeal swab, presence of viral mutation(s) within the areas targeted by this assay, and inadequate number of viral copies(<138 copies/mL). A negative result must be combined with clinical observations, patient history, and epidemiological information. The expected result is Negative.  Fact Sheet for Patients:  BloggerCourse.com  Fact Sheet for Healthcare Providers:  SeriousBroker.it  This  test is no t yet approved or cleared by the [place] FDA and  has been authorized for detection and/or diagnosis of SARS-CoV-2 by FDA under an Emergency Use Authorization (EUA). This EUA will remain  in effect (meaning this test can be used) for the duration of the COVID-19 declaration under Section 564(b)(1) of the Act, 21 U.S.C.section 360bbb-3(b)(1), unless the authorization is terminated  or revoked sooner.       Influenza A by PCR NEGATIVE NEGATIVE Final   Influenza B by PCR NEGATIVE NEGATIVE Final    Comment: (NOTE) The Xpert Xpress SARS-CoV-2/FLU/RSV plus assay is intended as an aid in the  diagnosis of influenza from Nasopharyngeal swab specimens and should not be used as a sole basis for treatment. Nasal washings and aspirates are unacceptable for Xpert Xpress SARS-CoV-2/FLU/RSV testing.  Fact Sheet for Patients: BloggerCourse.com  Fact Sheet for Healthcare Providers: SeriousBroker.it  This test is not yet approved or cleared by the [place] FDA and has been authorized for detection and/or diagnosis of SARS-CoV-2 by FDA under an Emergency Use Authorization (EUA). This EUA will remain in effect (meaning this test can be used) for the duration of the COVID-19 declaration under Section 564(b)(1) of the Act, 21 U.S.C. section 360bbb-3(b)(1), unless the authorization is terminated or revoked.     Resp Syncytial Virus by PCR NEGATIVE NEGATIVE Final    Comment: (NOTE) Fact Sheet for Patients: BloggerCourse.com  Fact Sheet for Healthcare Providers: SeriousBroker.it  This test is not yet approved or cleared by the [place] FDA and has been authorized for detection and/or diagnosis of SARS-CoV-2 by FDA under an Emergency Use Authorization (EUA). This EUA will remain in effect (meaning this test can be used) for the duration of the COVID-19 declaration under  Section 564(b)(1) of the Act, 21 U.S.C. section 360bbb-3(b)(1), unless the authorization is terminated or revoked.  Performed at [facility], [address], [place], [place] [address]      Radiological Exams on Admission: CT Angio Chest PE W/Cm &/Or Wo Cm  Result Date: 12/08/2022 CLINICAL DATA:  Chest pain. Elevated blood glucose. Fever and chills with nausea and vomiting for 3 days. EXAM: CT ANGIOGRAPHY CHEST CT ABDOMEN AND PELVIS WITH CONTRAST TECHNIQUE: Multidetector CT imaging of the chest was performed using the standard protocol during bolus administration of intravenous contrast. Multiplanar CT image reconstructions and MIPs were obtained to evaluate the vascular anatomy. Multidetector CT imaging of the abdomen and pelvis was performed using the standard protocol during bolus administration of intravenous contrast. RADIATION DOSE REDUCTION: This exam was performed according to the departmental dose-optimization program which includes automated exposure control, adjustment of the mA and/or kV according to patient size and/or use of iterative reconstruction technique. CONTRAST:  OMNIPAQUE IOHEXOL 350 MG/ML SOLN COMPARISON:  None Available. FINDINGS: CTA CHEST FINDINGS Cardiovascular: Satisfactory opacification of the pulmonary arteries to the segmental level. No evidence of pulmonary embolism. 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 The heart size appears within normal limits. No pericardial effusion. Normal heart size. No pericardial effusion. Mediastinum/Nodes: No enlarged mediastinal, hilar, or axillary lymph nodes. Thyroid gland, trachea, and esophagus demonstrate no significant findings. Lungs/Pleura: Central airways appear patent. There is no pleural fluid or airspace disease. No signs of pneumothorax. No suspicious pulmonary nodule or mass. Musculoskeletal: No chest wall abnormality. No acute or significant osseous findings.  Degenerative changes noted within the thoracic spine. Review of the MIP images confirms the above findings. CT ABDOMEN and PELVIS FINDINGS Hepatobiliary: No suspicious liver lesion. Status post cholecystectomy. Upper limits of normal common bile duct measures 6 mm. No intrahepatic bile duct dilatation. Pancreas: Unremarkable. No pancreatic ductal dilatation or surrounding inflammatory changes. Spleen: Normal in size without focal abnormality. Adrenals/Urinary Tract: Adrenal glands are unremarkable. Kidneys are normal, without renal calculi, focal lesion, or hydronephrosis. Bladder is unremarkable. Stomach/Bowel: Small hiatal hernia. Stomach otherwise normal. The appendix is visualized and appears within normal limits. The proximal small bowel loops appear normal. There are scattered mildly dilated loops of small bowel with air-fluid levels with intervening segments of normal caliber small bowel within the abdomen and pelvis. No significant bowel wall  thickening or inflammation identified. The appendix is visualized and is within normal limits. Vascular/Lymphatic: Normal appearance of the abdominal aorta. The upper abdominal vascularity is patent. No signs of abdominopelvic adenopathy. Reproductive: Status post hysterectomy. No adnexal masses. Other: No free fluid or fluid collections. No signs of pneumoperitoneum. No abdominal wall hernia identified. Musculoskeletal: No acute or significant osseous findings. Review of the MIP images confirms the above findings. IMPRESSION: 1. No evidence for acute pulmonary embolism. 2. 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 3. Multiple mildly dilated loops of small bowel with air-fluid levels, concerning for mild enteritis. No evidence for bowel obstruction. 4. Small hiatal hernia. Electronically Signed   By: Signa Kell M.D.   On: 12/08/2022 07:27   CT ABDOMEN PELVIS W CONTRAST  Result Date: 12/08/2022 CLINICAL DATA:  Chest pain.  Elevated blood glucose. Fever and chills with nausea and vomiting for 3 days. EXAM: CT ANGIOGRAPHY CHEST CT ABDOMEN AND PELVIS WITH CONTRAST TECHNIQUE: Multidetector CT imaging of the chest was performed using the standard protocol during bolus administration of intravenous contrast. Multiplanar CT image reconstructions and MIPs were obtained to evaluate the vascular anatomy. Multidetector CT imaging of the abdomen and pelvis was performed using the standard protocol during bolus administration of intravenous contrast. RADIATION DOSE REDUCTION: This exam was performed according to the departmental dose-optimization program which includes automated exposure control, adjustment of the mA and/or kV according to patient size and/or use of iterative reconstruction technique. CONTRAST:  OMNIPAQUE IOHEXOL 350 MG/ML SOLN COMPARISON:  None Available. FINDINGS: CTA CHEST FINDINGS Cardiovascular: Satisfactory opacification of the pulmonary arteries to the segmental level. No evidence of pulmonary embolism. 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 The heart size appears within normal limits. No pericardial effusion. Normal heart size. No pericardial effusion. Mediastinum/Nodes: No enlarged mediastinal, hilar, or axillary lymph nodes. Thyroid gland, trachea, and esophagus demonstrate no significant findings. Lungs/Pleura: Central airways appear patent. There is no pleural fluid or airspace disease. No signs of pneumothorax. No suspicious pulmonary nodule or mass. Musculoskeletal: No chest wall abnormality. No acute or significant osseous findings. Degenerative changes noted within the thoracic spine. Review of the MIP images confirms the above findings. CT ABDOMEN and PELVIS FINDINGS Hepatobiliary: No suspicious liver lesion. Status post cholecystectomy. Upper limits of normal common bile duct measures 6 mm. No intrahepatic bile duct dilatation. Pancreas: Unremarkable. No pancreatic  ductal dilatation or surrounding inflammatory changes. Spleen: Normal in size without focal abnormality. Adrenals/Urinary Tract: Adrenal glands are unremarkable. Kidneys are normal, without renal calculi, focal lesion, or hydronephrosis. Bladder is unremarkable. Stomach/Bowel: Small hiatal hernia. Stomach otherwise normal. The appendix is visualized and appears within normal limits. The proximal small bowel loops appear normal. There are scattered mildly dilated loops of small bowel with air-fluid levels with intervening segments of normal caliber small bowel within the abdomen and pelvis. No significant bowel wall thickening or inflammation identified. The appendix is visualized and is within normal limits. Vascular/Lymphatic: Normal appearance of the abdominal aorta. The upper abdominal vascularity is patent. No signs of abdominopelvic adenopathy. Reproductive: Status post hysterectomy. No adnexal masses. Other: No free fluid or fluid collections. No signs of pneumoperitoneum. No abdominal wall hernia identified. Musculoskeletal: No acute or significant osseous findings. Review of the MIP images confirms the above findings. IMPRESSION: 1. No evidence for acute pulmonary embolism. 2. 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 3. Multiple mildly dilated loops of small bowel with air-fluid levels, concerning  for mild enteritis. No evidence for bowel obstruction. 4. Small hiatal hernia. Electronically Signed   By: Signa Kell M.D.   On: 12/08/2022 07:27   DG Chest 2 View  Result Date: 12/08/2022 CLINICAL DATA:  Fever and chills with nausea and vomiting for 3 days. Chest pain as well. EXAM: CHEST - 2 VIEW COMPARISON:  Radiographs 04/20/2022 FINDINGS: The heart size and mediastinal contours are within normal limits. Both lungs are clear. The visualized skeletal structures are unremarkable. IMPRESSION: No active cardiopulmonary disease. Electronically Signed   By: Minerva Fester M.D.   On: 12/08/2022 01:05      Assessment/Plan Principal Problem:   Chest pain Active Problems:   Nausea vomiting and diarrhea   Diabetes mellitus without complication   Hypokalemia   Hypomagnesemia   HLD (hyperlipidemia)   Obesity (BMI 30-39.9)   Assessment and Plan:  Chest pain: Etiology is not clear.  CTA negative for PE.  Patient reports persistent pressure-like chest pain.  Initial troponin negative x 2.  Consulted cardiology, Dr. Duke Salvia.  -Place in tele bed for obs - Trend Trop - prn Nitroglycerin, Fentanyl - start aspirin, - continue home lipitor - Risk factor stratification: will check FLP and A1C  - check UDS  HLD: -Lipitor  Nausea vomiting and diarrhea: CT of abdomen/pelvis is negative for obstruction.  Possibly due to viral gastroenteritis. -IV fluid: 75 cc/h of normal saline -Check C. difficile and GI pathogen panel -As needed Zofran  Diabetes mellitus without complication: Recent A1c 6.6, well-controlled.  Patient is taking metformin, Ozempic and Tresiba 40 units daily at home -Sliding scale insulin -Glargine insulin 30 units daily  Hypokalemia and Hypomagnesemia: Potassium 3.0, Mg 1.6 -Repleted potassium and  -Check phosphorus level  Obesity (BMI 30-39.9): pt is on Ozempic. She was on Phetermine, but stopped taking it 5 days ago due to chest pain.  Body weight 98.9 kg, BMI 38.62 -Encourage losing weight -Exercise and healthy diet     DVT ppx: SQ Lovenox  Code Status: Full code  Family Communication:    Yes, patient's daughter   by phone  Disposition Plan:  Anticipate discharge back to previous environment  Consults called:  Dr. Duke Salvia of cardiology is consulted.  Admission status and Level of care: Telemetry Cardiac:    for obs    Dispo: The patient is from: Home              Anticipated d/c is to: Home              Anticipated d/c date is: 1 day              Patient currently is not medically stable to  d/c.    Severity of Illness:  The appropriate patient status for this patient is OBSERVATION. Observation status is judged to be reasonable and necessary in order to provide the required intensity of service to ensure the patient's safety. The patient's presenting symptoms, physical exam findings, and initial radiographic and laboratory data in the context of their medical condition is felt to place them at decreased risk for further clinical deterioration. Furthermore, it is anticipated that the patient will be medically stable for discharge from the hospital within 2 midnights of admission.        Date of Service 12/08/2022    Lorretta Harp [facility]   If 7PM-7AM, please contact night-coverage [email or web address] 12/08/2022, 10:19 AM

## 2022-12-08 NOTE — Consult Note (Signed)
Cardiology Consult    Patient ID: Sharon Marquez MRN: [id number], DOB/AGE: Jul 23, 1971   Admit date: 12/08/2022 Date of Consult: 12/08/2022  Primary Physician: [facility] Primary Cardiologist: None - new Requesting Provider: Jarvis Morgan, MD  Patient Profile    52 y/o ? w/ a h/o DM, obesity, normocytic anemia, B12 deficiency, and uterine fibroids, who is being seen today for the evaluation of pleuritic and chest wall pain at the request of Dr. Clyde Lundborg.  Past Medical History   Past Medical History:  Diagnosis Date   Diabetes mellitus without complication    Family history of adverse reaction to anesthesia    "mother has difficulty waking up after surgery ."   Fibroid uterus 2014   History of blood transfusion 2014, 2015   for symptomatic anemia   Normocytic anemia    Synovitis of wrist    left   Vitamin B deficiency     Past Surgical History:  Procedure Laterality Date   ABDOMINAL HYSTERECTOMY     CHOLECYSTECTOMY     LAPAROSCOPIC VAGINAL HYSTERECTOMY WITH SALPINGECTOMY Bilateral 07/15/2017   Procedure: LAPAROSCOPIC ASSISTED VAGINAL HYSTERECTOMY WITH BILATERAL SALPINGECTOMY;  Surgeon: Hildred Laser, MD;  Location: [facility];  Service: Gynecology;  Laterality: Bilateral;   TENDON TRANSFER Left 06/06/2018   Procedure: Repair / Reconstruction, Open Extensor Carpi Ulnaris Debridement Repair and Stabilization;  Surgeon: Dominica Severin, MD;  Location: [facility];  Service: Orthopedics;  Laterality: Left;   WRIST ARTHROSCOPY Left 06/06/2018   Procedure: Left wrist arthroscopy with synovectomy;  Surgeon: Dominica Severin, MD;  Location: [facility];  Service: Orthopedics;  Laterality: Left;  90 mins     Allergies  Allergies  Allergen Reactions   Meloxicam Itching   Tramadol Itching and Swelling    History of Present Illness    52 y/o ? w/ a h/o DM, obesity, normocytic anemia, B12 deficiency, and uterine fibroids.  She has no prior  cardiac history.  She was in her usual state of health until about 2 weeks ago, when she started experiencing a runny nose and frequent cough.  She thinks that she was probably just having seasonal allergies but she cannot be sure that she did not come down with an upper respiratory and infection.  Those symptoms seem to get better over the course of last week though she continued to cough and she started noticing on Monday, April 8, that with coughing, she was experiencing retrosternal and epigastric chest pain.  That discomfort became constant in nature and was associated with mild dyspnea.  Chest pain was worse with palpation, upper body movements, and deep breathing.  She did note that pain seem to be a little bit better sitting up and a little bit worse with lying down.  By Wednesday, April 10, she started experiencing nausea and vomiting, and noted that she was unable to keep anything down including water.  Due to progressive symptoms, she presented to the emergency department just after midnight this morning.  Here, she was hemodynamically stable.  Labs notable for K of 3.0, Mg 1.6 (both supplemented), normal troponins x 3.  ECG w/ sinus tachycardia, 105, PVC, LAD, LAFB, LVH, poor R progression - no acute changes.  CTA chest w/o PE.  Abd findings concerning for small hiatal hernia and mild enteritis.  She continues complain of constant chest pain and chest wall tenderness.  Inpatient Medications     [START ON 12/09/2022] aspirin EC  81 mg Oral Daily  atorvastatin  40 mg Oral Daily   enoxaparin (LOVENOX) injection  0.5 mg/kg Subcutaneous Q24H   insulin aspart  0-5 Units Subcutaneous QHS   insulin aspart  0-9 Units Subcutaneous TID WC   insulin glargine-yfgn  30 Units Subcutaneous QHS    Family History    Family History  Problem Relation Age of Onset   Hyperthyroidism Mother    Diabetes Mother    Hypertension Mother    Hyperthyroidism Maternal Aunt    Diabetes Maternal Grandmother     Diabetes Maternal Uncle    Diabetes Maternal Grandfather    She indicated that her mother is alive. She indicated that her father is alive. She indicated that her maternal grandmother is deceased. She indicated that her maternal grandfather is deceased. She indicated that the status of her maternal aunt is unknown. She indicated that her maternal uncle is deceased.   Social History    Social History   Socioeconomic History   Marital status: Single    Spouse name: Not on file   Number of children: Not on file   Years of education: Not on file   Highest education level: Not on file  Occupational History   Not on file  Tobacco Use   Smoking status: Never   Smokeless tobacco: Never  Vaping Use   Vaping Use: Never used  Substance and Sexual Activity   Alcohol use: No   Drug use: No   Sexual activity: Not Currently    Birth control/protection: Surgical  Other Topics Concern   Not on file  Social History Narrative   Lives locally by herself.  Does not routinely exercise.   Social Determinants of Health   Financial Resource Strain: Not on file  Food Insecurity: Not on file  Transportation Needs: Not on file  Physical Activity: Not on file  Stress: Not on file  Social Connections: Not on file  Intimate Partner Violence: Not on file     Review of Systems    General:  No chills, fever, night sweats or weight changes.  Cardiovascular: +++ chest wall pain and tenderness - worse w/ palpation, deep breathing, upper body position changes, and lying flat.  Sl improvement w/ sitting forward.  +++ dyspnea on exertion, no edema, orthopnea, palpitations, paroxysmal nocturnal dyspnea. Dermatological: No rash, lesions/masses Respiratory: +++ cough, +++ dyspnea Urologic: No hematuria, dysuria Abdominal:   +++ nausea, +++ vomiting, no diarrhea, bright red blood per rectum, melena, or hematemesis Neurologic:  No visual changes, wkns, changes in mental status. All other systems reviewed and  are otherwise negative except as noted above.  Physical Exam    Blood pressure 103/70, pulse 79, temperature 98.8 F (37.1 C), temperature source Oral, resp. rate 18, height 5\' 3"  (1.6 m), weight 98.9 kg, last menstrual period 07/07/2017, SpO2 100 %.  General: Pleasant, NAD Psych: Normal affect. Neuro: Alert and oriented X 3. Moves all extremities spontaneously. HEENT: Normal  Neck: Supple without bruits or JVD. Lungs:  Resp regular and unlabored, CTA. Heart: RRR no s3, s4, or murmurs.  Diffuse precordial chest wall tenderness. Abdomen: Epigastric tenderness to light touch.  Soft, non-distended, BS + x 4.  Extremities: No clubbing, cyanosis or edema. DP/PT2+, Radials 2+ and equal bilaterally.  Labs    Cardiac Enzymes Recent Labs  Lab 12/08/22 0032 12/08/22 0307 12/08/22 0833  TROPONINIHS 4 5 6      Lab Results  Component Value Date   WBC 4.7 12/08/2022   HGB 12.5 12/08/2022   HCT 39.3 12/08/2022  MCV 89.9 12/08/2022   PLT 304 12/08/2022    Recent Labs  Lab 12/08/22 0032 12/08/22 0307  NA 139  --   K 3.0*  --   CL 100  --   CO2 26  --   BUN 15  --   CREATININE 0.85  --   CALCIUM 9.2  --   PROT  --  7.9  BILITOT  --  0.5  ALKPHOS  --  76  ALT  --  20  AST  --  21  GLUCOSE 278*  --     Radiology Studies    CT Angio Chest PE W/Cm &/Or Wo Cm  Result Date: 12/08/2022 CLINICAL DATA:  Chest pain. Elevated blood glucose. Fever and chills with nausea and vomiting for 3 days. EXAM: CT ANGIOGRAPHY CHEST CT ABDOMEN AND PELVIS WITH CONTRAST TECHNIQUE: Multidetector CT imaging of the chest was performed using the standard protocol during bolus administration of intravenous contrast. Multiplanar CT image reconstructions and MIPs were obtained to evaluate the vascular anatomy. Multidetector CT imaging of the abdomen and pelvis was performed using the standard protocol during bolus administration of intravenous contrast. RADIATION DOSE REDUCTION: This exam was performed  according to the departmental dose-optimization program which includes automated exposure control, adjustment of the mA and/or kV according to patient size and/or use of iterative reconstruction technique. CONTRAST:  OMNIPAQUE IOHEXOL 350 MG/ML SOLN COMPARISON:  None Available. FINDINGS: CTA CHEST FINDINGS Cardiovascular: Satisfactory opacification of the pulmonary arteries to the segmental level. No evidence of pulmonary embolism. 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 The heart size appears within normal limits. No pericardial effusion. Normal heart size. No pericardial effusion. Mediastinum/Nodes: No enlarged mediastinal, hilar, or axillary lymph nodes. Thyroid gland, trachea, and esophagus demonstrate no significant findings. Lungs/Pleura: Central airways appear patent. There is no pleural fluid or airspace disease. No signs of pneumothorax. No suspicious pulmonary nodule or mass. Musculoskeletal: No chest wall abnormality. No acute or significant osseous findings. Degenerative changes noted within the thoracic spine. Review of the MIP images confirms the above findings. CT ABDOMEN and PELVIS FINDINGS Hepatobiliary: No suspicious liver lesion. Status post cholecystectomy. Upper limits of normal common bile duct measures 6 mm. No intrahepatic bile duct dilatation. Pancreas: Unremarkable. No pancreatic ductal dilatation or surrounding inflammatory changes. Spleen: Normal in size without focal abnormality. Adrenals/Urinary Tract: Adrenal glands are unremarkable. Kidneys are normal, without renal calculi, focal lesion, or hydronephrosis. Bladder is unremarkable. Stomach/Bowel: Small hiatal hernia. Stomach otherwise normal. The appendix is visualized and appears within normal limits. The proximal small bowel loops appear normal. There are scattered mildly dilated loops of small bowel with air-fluid levels with intervening segments of normal caliber small bowel within the  abdomen and pelvis. No significant bowel wall thickening or inflammation identified. The appendix is visualized and is within normal limits. Vascular/Lymphatic: Normal appearance of the abdominal aorta. The upper abdominal vascularity is patent. No signs of abdominopelvic adenopathy. Reproductive: Status post hysterectomy. No adnexal masses. Other: No free fluid or fluid collections. No signs of pneumoperitoneum. No abdominal wall hernia identified. Musculoskeletal: No acute or significant osseous findings. Review of the MIP images confirms the above findings. IMPRESSION: 1. No evidence for acute pulmonary embolism. 2. 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 3. Multiple mildly dilated loops of small bowel with air-fluid levels, concerning for mild enteritis. No evidence for bowel obstruction. 4. Small hiatal hernia. Electronically Signed   By: Veronda Prude.D.  On: 12/08/2022 07:27   CT ABDOMEN PELVIS W CONTRAST  Result Date: 12/08/2022 CLINICAL DATA:  Chest pain. Elevated blood glucose. Fever and chills with nausea and vomiting for 3 days. EXAM: CT ANGIOGRAPHY CHEST CT ABDOMEN AND PELVIS WITH CONTRAST TECHNIQUE: Multidetector CT imaging of the chest was performed using the standard protocol during bolus administration of intravenous contrast. Multiplanar CT image reconstructions and MIPs were obtained to evaluate the vascular anatomy. Multidetector CT imaging of the abdomen and pelvis was performed using the standard protocol during bolus administration of intravenous contrast. RADIATION DOSE REDUCTION: This exam was performed according to the departmental dose-optimization program which includes automated exposure control, adjustment of the mA and/or kV according to patient size and/or use of iterative reconstruction technique. CONTRAST:  OMNIPAQUE IOHEXOL 350 MG/ML SOLN COMPARISON:  None Available. FINDINGS: CTA CHEST FINDINGS Cardiovascular: Satisfactory  opacification of the pulmonary arteries to the segmental level. No evidence of pulmonary embolism. 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 The heart size appears within normal limits. No pericardial effusion. Normal heart size. No pericardial effusion. Mediastinum/Nodes: No enlarged mediastinal, hilar, or axillary lymph nodes. Thyroid gland, trachea, and esophagus demonstrate no significant findings. Lungs/Pleura: Central airways appear patent. There is no pleural fluid or airspace disease. No signs of pneumothorax. No suspicious pulmonary nodule or mass. Musculoskeletal: No chest wall abnormality. No acute or significant osseous findings. Degenerative changes noted within the thoracic spine. Review of the MIP images confirms the above findings. CT ABDOMEN and PELVIS FINDINGS Hepatobiliary: No suspicious liver lesion. Status post cholecystectomy. Upper limits of normal common bile duct measures 6 mm. No intrahepatic bile duct dilatation. Pancreas: Unremarkable. No pancreatic ductal dilatation or surrounding inflammatory changes. Spleen: Normal in size without focal abnormality. Adrenals/Urinary Tract: Adrenal glands are unremarkable. Kidneys are normal, without renal calculi, focal lesion, or hydronephrosis. Bladder is unremarkable. Stomach/Bowel: Small hiatal hernia. Stomach otherwise normal. The appendix is visualized and appears within normal limits. The proximal small bowel loops appear normal. There are scattered mildly dilated loops of small bowel with air-fluid levels with intervening segments of normal caliber small bowel within the abdomen and pelvis. No significant bowel wall thickening or inflammation identified. The appendix is visualized and is within normal limits. Vascular/Lymphatic: Normal appearance of the abdominal aorta. The upper abdominal vascularity is patent. No signs of abdominopelvic adenopathy. Reproductive: Status post hysterectomy. No adnexal masses.  Other: No free fluid or fluid collections. No signs of pneumoperitoneum. No abdominal wall hernia identified. Musculoskeletal: No acute or significant osseous findings. Review of the MIP images confirms the above findings. IMPRESSION: 1. No evidence for acute pulmonary embolism. 2. 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 3. Multiple mildly dilated loops of small bowel with air-fluid levels, concerning for mild enteritis. No evidence for bowel obstruction. 4. Small hiatal hernia. Electronically Signed   By: Signa Kell M.D.   On: 12/08/2022 07:27   DG Chest 2 View  Result Date: 12/08/2022 CLINICAL DATA:  Fever and chills with nausea and vomiting for 3 days. Chest pain as well. EXAM: CHEST - 2 VIEW COMPARISON:  Radiographs 04/20/2022 FINDINGS: The heart size and mediastinal contours are within normal limits. Both lungs are clear. The visualized skeletal structures are unremarkable. IMPRESSION: No active cardiopulmonary disease. Electronically Signed   By: Minerva Fester M.D.   On: 12/08/2022 01:05    ECG & Cardiac Imaging    Sinus tachycardia, 105, LAD, LAFB, PVC, LVH, poor R progression - no acute  changes - personally reviewed.  Assessment & Plan    1.  Chest wall tenderness/Pleuritic Chest Pain:  pt w/ runny nose and cough that started last week.  She is not sure if maybe it was just her allergies or if she had a viral upper respiratory infection.  No fevers.  By April 8, she noted constant chest wall and epigastric tenderness that was worse with deep breathing, palpation, and upper body movements.  She also noted mild persistent dyspnea.  Symptoms seem to be better once she is sitting up.  ECG w/ sinus tachy, but no acute ST/T changes.  CTA chest/abd w/o PE, though notable for suggestion of PAH, HH, and mild enteritis.  No evidence of pericardial effusion on CT.  Troponins nl.  Symptoms easily reproducible w/ light palpation over the precordium/epigastrum.  F/u  echo. Check sed rate.  Would consider adding colchicine if sed rate elev/effusion on echo.  Otw conservative mgmt.  2.  Nausea/Vomiting:  Mild enteritis and small hiatal hernia on CT.  Mgmt per IM.  3. DMII:  per IM.  4.  HL:  cont statin rx.  5.  Hypokalemia:  supplementation ordered.  Signed, Nicolasa Ducking, NP 12/08/2022, 12:27 PM  For questions or updates, please contact   Please consult [email or web address] for contact info under Cardiology/STEMI.

## 2022-12-08 NOTE — ED Provider Notes (Signed)
-----------------------------------------   6:59 AM on 12/08/2022 -----------------------------------------  Blood pressure 115/69, pulse 92, temperature 98.6 F (37 C), temperature source Oral, resp. rate 20, height 5\' 3"  (1.6 m), weight 98.9 kg, last menstrual period 07/07/2017, SpO2 98 %.  Assuming care from Dr. York Cerise.  In short, Sharon Marquez is a 52 y.o. female with a chief complaint of Chest Pain, Hyperglycemia, and flu symptoms  .  Refer to the original H&P for additional details.  The current plan of care is to follow-up CT imaging and plan for admission for high risk chest pain.  ----------------------------------------- 8:19 AM on 12/08/2022 ----------------------------------------- CT imaging remarkable only for enteritis, no evidence of PE or other emergent process in the chest, abdomen, or pelvis.  Loading dose of aspirin was given, case discussed with hospitalist for admission for further workup for high risk chest pain.    Chesley Noon, MD 12/08/22 [phone]

## 2022-12-08 NOTE — ED Notes (Signed)
Patient transported back from CT 

## 2022-12-08 NOTE — ED Notes (Signed)
ED Provider at bedside. 

## 2022-12-08 NOTE — ED Provider Notes (Signed)
[name] Provider Note    Event Date/Time   First MD Initiated Contact with Patient 12/08/22 0222     (approximate)   History   Chest Pain, Hyperglycemia, and flu symptoms    HPI Sharon Marquez is a 52 y.o. female with no prior cardiac history and who is status post cholecystectomy perhaps as many as 30 years ago.  She presents for complaints of fever and chills associated with nausea, vomiting, and diarrhea for about 3 days, along with a feeling of dull chest pressure throughout her anterior chest.  The symptoms all started at about the same time and have been persistent.  Nothing in particular seems to make it better or worse, but she said that she had a hard time keeping anything down.  Her nausea went away after getting a Zofran in the emergency department.  She has also had a headache recently.  She denies any shortness of breath and denies abdominal pain.     Physical Exam   Triage Vital Signs: ED Triage Vitals [12/08/22 0018]  Enc Vitals Group     BP (!) 131/91     Pulse Rate (!) 103     Resp 20     Temp 98.5 F (36.9 C)     Temp Source Oral     SpO2 99 %     Weight 98.9 kg (218 lb)     Height 1.6 m ( )     Head Circumference      Peak Flow      Pain Score 7     Pain Loc      Pain Edu?      Excl. in GC?     Most recent vital signs: Vitals:   12/08/22 0615 12/08/22 0700  BP:  131/84  Pulse: 92 83  Resp: 20 18  Temp:    SpO2: 98% 100%    General: Awake, generally well-appearing and in no obvious distress. CV:  Good peripheral perfusion.  Initially tachycardic at triage, now regular rate and rhythm. Resp:  Normal effort. Speaking easily and comfortably, no accessory muscle usage nor intercostal retractions.   Abd:  No distention.  Patient has some tenderness to palpation with guarding in the right upper quadrant, less so in the epigastrium but still some minimal tenderness.  No lower abdominal tenderness to palpation.   ED  Results / Procedures / Treatments   Labs (all labs ordered are listed, but only abnormal results are displayed) Labs Reviewed  BASIC METABOLIC PANEL - Abnormal; Notable for the following components:      Result Value   Potassium 3.0 (*)    Glucose, Bld 278 (*)    All other components within normal limits  URINALYSIS, ROUTINE W REFLEX MICROSCOPIC - Abnormal; Notable for the following components:   Color, Urine YELLOW (*)    APPearance CLOUDY (*)    Protein, ur 100 (*)    Leukocytes,Ua SMALL (*)    Bacteria, UA RARE (*)    All other components within normal limits  RESP PANEL BY RT-PCR (RSV, FLU A&B, COVID)  RVPGX2  CBC  LIPASE, BLOOD  HEPATIC FUNCTION PANEL  HEMOGLOBIN A1C  MAGNESIUM  HIV ANTIBODY (ROUTINE TESTING W REFLEX)  PROTIME-INR  APTT  URINE DRUG SCREEN, QUALITATIVE ([facility] ONLY)  TROPONIN I (HIGH SENSITIVITY)  TROPONIN I (HIGH SENSITIVITY)  TROPONIN I (HIGH SENSITIVITY)     EKG  ED ECG REPORT I, Loleta Rose, the attending physician, personally viewed and  interpreted this ECG.  Date: 12/08/2022 EKG Time: 00: 26 Rate: 105 Rhythm: Sinus tachycardia QRS Axis: normal Intervals: normal ST/T Wave abnormalities: Non-specific ST segment / T-wave changes, but no clear evidence of acute ischemia. Narrative Interpretation: no definitive evidence of acute ischemia; does not meet STEMI criteria.    RADIOLOGY I viewed and interpreted the patient's two-view chest x-ray.  I see no evidence of pneumonia or widened mediastinum.  I also read the radiologist's report, which confirmed no acute findings.   PROCEDURES:  Critical Care performed: No  .1-3 Lead EKG Interpretation  Performed by: Loleta Rose, MD Authorized by: Loleta Rose, MD     Interpretation: normal     ECG rate:  85   ECG rate assessment: normal     Rhythm: sinus rhythm     Ectopy: none     Conduction: normal       IMPRESSION / MDM / ASSESSMENT AND PLAN / ED COURSE  I reviewed the triage  vital signs and the nursing notes.                              Differential diagnosis includes, but is not limited to, viral gastroenteritis, nonspecific gastritis, hepatic bile stones, acid reflux, pancreatitis, atypical ACS, PE.  Patient's presentation is most consistent with acute presentation with potential threat to life or bodily function.  Labs/studies ordered: 2 view chest x-ray, EKG, CBC, BMP, lipase, respiratory viral panel, hepatic function panel, high-sensitivity troponin x 2.  Interventions/Medications given:  Medications  nitroGLYCERIN (NITROSTAT) SL tablet 0.4 mg (has no administration in time range)  fentaNYL (SUBLIMAZE) injection 25 mcg (has no administration in time range)  albuterol (PROVENTIL) (2.5 MG/3ML) 0.083% nebulizer solution 3 mL (has no administration in time range)  ondansetron (ZOFRAN) injection 4 mg (has no administration in time range)  acetaminophen (TYLENOL) tablet 650 mg (has no administration in time range)  insulin aspart (novoLOG) injection 0-9 Units (has no administration in time range)  insulin aspart (novoLOG) injection 0-5 Units (has no administration in time range)  0.9 %  sodium chloride infusion ( Intravenous New Bag/Given 12/08/22 0851)  enoxaparin (LOVENOX) injection 50 mg (has no administration in time range)  ondansetron (ZOFRAN-ODT) disintegrating tablet 4 mg (4 mg Oral Given 12/08/22 0030)  potassium chloride SA (KLOR-CON M) CR tablet 40 mEq (40 mEq Oral Given 12/08/22 0431)  iohexol (OMNIPAQUE) 350 MG/ML injection 100 mL (100 mLs Intravenous Contrast Given 12/08/22 0646)  aspirin chewable tablet 324 mg (324 mg Oral Given 12/08/22 0813)   (Note:  hospital course my include additional interventions and/or labs/studies not listed above.)   Vital signs are stable and within normal limits.  Strongly doubt ACS and PE and the patient's heart rate has resolved to normal without intervention.  She has no risk factors for PE and would be PERC  negative for age rules and 43.  Regardless her Wells score for PE is 0.  Respiratory viral panel and essentially all of her labs are normal except for hypokalemia for which I ordered 40 mill equivalents of potassium p.o. now that she no longer is nauseated, and hyperglycemia at 278 but with a normal anion gap.  I strongly suspect her symptoms are the result of a viral gastroenteritis.  She is guarding somewhat in her right upper quadrant but this may be baseline or the result of her persistent vomiting.  She feels better now and she is status postcholecystectomy, but theoretically she  could have developed hepatic biliary stones since her cholecystectomy was 30 years ago.  Pancreatitis is very unlikely given her normal lipase.  I added on a hepatic function panel and if she has LFT abnormalities I will evaluate with further imaging.  However since she is feeling better and passed a p.o. challenge, she would likely be appropriate for discharge and outpatient follow-up.  The patient is on the cardiac monitor to evaluate for evidence of arrhythmia and/or significant heart rate changes.   Clinical Course as of 12/08/22 0858  Sat Dec 08, 2022  1610 Hepatic function panel is within normal limits.  However, I reassessed the patient and anticipating that she was going to be feeling better, and states she feels worse.  She says she continues to have a bloating and dull pain in her upper abdomen, and she said that she feels more heaviness and pressure on her chest.  Again I think that ACS is unlikely, but unstable angina is possible.  Additionally, given the recent history of GI problems, she may have a partial SBO or ileus that is referring some atypical pain to her chest.  We discussed it and we will proceed with CTA chest given the low risk but still possibility of PE and follow-up with a CT abdomen/pelvis to rule out emergent causes of atypical symptoms.  The patient feels strongly that something is very  much the matter with her and would feel more comfortable for chest pain observation/admission even if the scans are negative so that will be the plan moving forward. [CF]  0710 Transferring ED care to Dr. Larinda Buttery to follow up on CT scans and admit to the appropriate service based on the results (surgery if abdominal abnormality identified vs hospitalist for chest pain evaluation). [CF]    Clinical Course User Index [CF] Loleta Rose, MD     FINAL CLINICAL IMPRESSION(S) / ED DIAGNOSES   Final diagnoses:  Chest pain, unspecified type  Enteritis     Rx / DC Orders   ED Discharge Orders     None        Note:  This document was prepared using Dragon voice recognition software and may include unintentional dictation errors.   Loleta Rose, MD 12/08/22 [phone]

## 2022-12-08 NOTE — Progress Notes (Signed)
PHARMACIST - PHYSICIAN COMMUNICATION  CONCERNING:  Enoxaparin (Lovenox) for DVT Prophylaxis    RECOMMENDATION: Patient was prescribed enoxaprin 40mg  q24 hours for VTE prophylaxis.   Filed Weights   12/08/22 0018  Weight: 98.9 kg (218 lb)    Body mass index is 38.62 kg/m.  Estimated Creatinine Clearance: 87.8 mL/min (by C-G formula based on SCr of 0.85 mg/dL).   Based on [facility] policy patient is candidate for enoxaparin 0.5mg /kg TBW SQ every 24 hours based on BMI being >30.   DESCRIPTION: Pharmacy has adjusted enoxaparin dose per [facility] policy.  Patient is now receiving enoxaparin 0.5 mg/kg every 24 hours    Orson Aloe, PharmD Clinical Pharmacist  12/08/2022 8:30 AM

## 2022-12-08 NOTE — ED Notes (Signed)
Pt has Dexcom in place. Per that monitor CBG is 312 currently. Pt requests to hold off on finger stick glucose for now.

## 2022-12-08 NOTE — ED Triage Notes (Addendum)
Pt reports fever and chills with n/v x 3 days. Reports chest pain as well x 3-5 days.  Pt also reports elevated blood glucose with value of 323. Pt ambulated into triage with independent and steady gait. Breathing unlabored with symmetric chest rise and fall. Pt reports continued n/v and fatigue. Also endorses h/a.

## 2022-12-09 ENCOUNTER — Observation Stay (HOSPITAL_BASED_OUTPATIENT_CLINIC_OR_DEPARTMENT_OTHER)
Admit: 2022-12-09 | Discharge: 2022-12-09 | Disposition: A | Discharge status: Home / Self Care | Payer: BLUE CROSS/BLUE SHIELD | Attending: Nurse Practitioner | Admitting: Nurse Practitioner

## 2022-12-09 DIAGNOSIS — R079 Chest pain, unspecified: Secondary | ICD-10-CM | POA: Diagnosis not present

## 2022-12-09 DIAGNOSIS — K529 Noninfective gastroenteritis and colitis, unspecified: Secondary | ICD-10-CM | POA: Diagnosis not present

## 2022-12-09 LAB — ECHOCARDIOGRAM COMPLETE
AR max vel: 2.04 cm2
AV Peak grad: 8 mmHg
Ao pk vel: 1.41 m/s
Area-P 1/2: 3.66 cm2
Height: 63 in
S' Lateral: 3.2 cm
Single Plane A4C EF: 56.3 %
Weight: 3488 oz

## 2022-12-09 LAB — CBC
HCT: 33.8 % — ABNORMAL LOW (ref 36.0–46.0)
Hemoglobin: 11 g/dL — ABNORMAL LOW (ref 12.0–15.0)
MCH: 29.5 pg (ref 26.0–34.0)
MCHC: 32.5 g/dL (ref 30.0–36.0)
MCV: 90.6 fL (ref 80.0–100.0)
Platelets: 255 10*3/uL (ref 150–400)
RBC: 3.73 MIL/uL — ABNORMAL LOW (ref 3.87–5.11)
RDW: 15.6 % — ABNORMAL HIGH (ref 11.5–15.5)
WBC: 3.9 10*3/uL — ABNORMAL LOW (ref 4.0–10.5)
nRBC: 0 % (ref 0.0–0.2)

## 2022-12-09 LAB — LIPID PANEL
Cholesterol: 101 mg/dL (ref 0–200)
HDL: 41 mg/dL (ref >40)
LDL Cholesterol: 44 mg/dL (ref 0–99)
Total CHOL/HDL Ratio: 2.5 RATIO
Triglycerides: 80 mg/dL (ref <150)
VLDL: 16 mg/dL (ref 0–40)

## 2022-12-09 LAB — GLUCOSE, CAPILLARY
Glucose-Capillary: 118 mg/dL — ABNORMAL HIGH (ref 70–99)
Glucose-Capillary: 168 mg/dL — ABNORMAL HIGH (ref 70–99)
Glucose-Capillary: 153 mg/dL — ABNORMAL HIGH (ref 70–99)

## 2022-12-09 LAB — BASIC METABOLIC PANEL
Anion gap: 7 (ref 5–15)
BUN: 10 mg/dL (ref 6–20)
CO2: 25 mmol/L (ref 22–32)
Calcium: 8.2 mg/dL — ABNORMAL LOW (ref 8.9–10.3)
Chloride: 105 mmol/L (ref 98–111)
Creatinine, Ser: 0.53 mg/dL (ref 0.44–1.00)
GFR, Estimated: >60 mL/min (ref >60)
Glucose, Bld: 183 mg/dL — ABNORMAL HIGH (ref 70–99)
Potassium: 3.2 mmol/L — ABNORMAL LOW (ref 3.5–5.1)
Sodium: 137 mmol/L (ref 135–145)

## 2022-12-09 LAB — BRAIN NATRIURETIC PEPTIDE: B Natriuretic Peptide: 9.9 pg/mL (ref 0.0–100.0)

## 2022-12-09 LAB — MAGNESIUM: Magnesium: 2 mg/dL (ref 1.7–2.4)

## 2022-12-09 LAB — CBG MONITORING, ED: Glucose-Capillary: 224 mg/dL — ABNORMAL HIGH (ref 70–99)

## 2022-12-09 MED ORDER — FLUTICASONE PROPIONATE 50 MCG/ACT NA SUSP
2.0000 | Freq: Every day | NASAL | Status: DC
  Administered 2022-12-09 – 2022-12-10 (×3): 2 via NASAL
  Filled 2022-12-09: qty 16

## 2022-12-09 MED ORDER — LOPERAMIDE HCL 2 MG PO CAPS
2.0000 mg | ORAL_CAPSULE | Freq: Two times a day (BID) | ORAL | Status: DC | PRN
  Administered 2022-12-09: 2 mg via ORAL
  Filled 2022-12-09: qty 1

## 2022-12-09 MED ORDER — LORATADINE 10 MG PO TABS
10.0000 mg | ORAL_TABLET | Freq: Every day | ORAL | Status: DC
  Administered 2022-12-09 – 2022-12-11 (×3): 10 mg via ORAL
  Filled 2022-12-09 (×3): qty 1

## 2022-12-09 MED ORDER — POTASSIUM CHLORIDE 20 MEQ PO PACK
40.0000 meq | PACK | Freq: Once | ORAL | Status: AC
  Administered 2022-12-09: 40 meq via ORAL
  Filled 2022-12-09: qty 2

## 2022-12-09 NOTE — Progress Notes (Signed)
  Echocardiogram 2D Echocardiogram has been performed.  Sharon Marquez 12/09/2022, 10:31 AM

## 2022-12-09 NOTE — Progress Notes (Signed)
Cardiology Progress Note   Patient Name: Sharon Marquez Date of Encounter: 12/09/2022  Primary Cardiologist: None - new - seen by Lavell Islam, MD   Subjective   Ongoing 7-8/10 precordial and epigastric pain that is worse w/ deep breathing, upper body position changes, and light palpation.  No dyspnea.  Inpatient Medications    Scheduled Meds:  aspirin EC  81 mg Oral Daily   atorvastatin  40 mg Oral Daily   enoxaparin (LOVENOX) injection  0.5 mg/kg Subcutaneous Q24H   fluticasone  2 spray Each Nare Daily   insulin aspart  0-5 Units Subcutaneous QHS   insulin aspart  0-9 Units Subcutaneous TID WC   insulin glargine-yfgn  30 Units Subcutaneous QHS   loratadine  10 mg Oral Daily   Continuous Infusions:  sodium chloride 75 mL/hr at 12/09/22 0420   PRN Meds: acetaminophen, albuterol, fentaNYL (SUBLIMAZE) injection, guaiFENesin-codeine, nitroGLYCERIN, ondansetron (ZOFRAN) IV   Vital Signs    Vitals:   12/08/22 1715 12/08/22 2136 12/09/22 0407 12/09/22 0750  BP: 112/75 122/80 130/75 125/79  Pulse: 78 81 74 86  Resp: Temp: 97.9 F (36.6 C) 98.1 F (36.7 C) 98.2 F (36.8 C)   TempSrc: Oral Oral Oral   SpO2: 100% 100% 100% 99%  Weight:      Height:        Intake/Output Summary (Last 24 hours) at 12/09/2022 0827 Last data filed at 12/08/2022 1727 Gross per 24 hour  Intake 645 ml  Output --  Net 645 ml   Filed Weights   12/08/22 0018  Weight: 98.9 kg    Physical Exam   GEN: Well nourished, well developed, in no acute distress.  HEENT: Grossly normal.  Neck: Supple, no JVD, carotid bruits, or masses. Cardiac: RRR, no murmurs, rubs, or gallops. Diffuse precordial chest wall tenderness.  No clubbing, cyanosis, edema.  Radials 2+, DP/PT 2+ and equal bilaterally.  Respiratory:  Respirations regular and unlabored, clear to auscultation bilaterally. GI: Soft, epigastric tenderness, nondistended, BS + x 4. MS: no deformity or atrophy. Skin: warm and dry, no  rash. Neuro:  Strength and sensation are intact. Psych: AAOx3.  Normal affect.  Labs    Chemistry Recent Labs  Lab 12/08/22 0032 12/08/22 0307 12/09/22 0349  NA 139  --  137  K 3.0*  --  3.2*  CL 100  --  105  CO2 26  --  25  GLUCOSE 278*  --  183*  BUN 15  --  10  CREATININE 0.85  --  0.53  CALCIUM 9.2  --  8.2*  PROT  --  7.9  --   ALBUMIN  --  3.9  --   AST  --  21  --   ALT  --  20  --   ALKPHOS  --  76  --   BILITOT  --  0.5  --   GFRNONAA >60  --  >60  ANIONGAP 13  --  7     Hematology Recent Labs  Lab 12/08/22 0032 12/09/22 0349  WBC 4.7 3.9*  RBC 4.37 3.73*  HGB 12.5 11.0*  HCT 39.3 33.8*  MCV 89.9 90.6  MCH 28.6 29.5  MCHC 31.8 32.5  RDW 15.2 15.6*  PLT 304 255    Cardiac Enzymes  Recent Labs  Lab 12/08/22 0032 12/08/22 0307 12/08/22 0833 12/08/22 1439  TROPONINIHS BNP    Component Value  Date/Time   BNP 9.9 12/09/2022 0349   Lipids  Lab Results  Component Value Date   CHOL 101 12/09/2022   HDL 41 12/09/2022   LDLCALC 44 12/09/2022   TRIG 80 12/09/2022   CHOLHDL 2.5 12/09/2022    HbA1c  Lab Results  Component Value Date   HGBA1C 8.0 (H) 12/08/2022    Radiology    CT Angio Chest PE W/Cm &/Or Wo Cm  Result Date: 12/08/2022 CLINICAL DATA:  Chest pain. Elevated blood glucose. Fever and chills with nausea and vomiting for 3 days. EXAM: CT ANGIOGRAPHY CHEST CT ABDOMEN AND PELVIS WITH CONTRAST TECHNIQUE: Multidetector CT imaging of the chest was performed using the standard protocol during bolus administration of intravenous contrast. Multiplanar CT image reconstructions and MIPs were obtained to evaluate the vascular anatomy. Multidetector CT imaging of the abdomen and pelvis was performed using the standard protocol during bolus administration of intravenous contrast. RADIATION DOSE REDUCTION: This exam was performed according to the departmental dose-optimization program which includes automated exposure control,  adjustment of the mA and/or kV according to patient size and/or use of iterative reconstruction technique. CONTRAST:  OMNIPAQUE IOHEXOL 350 MG/ML SOLN COMPARISON:  None Available. FINDINGS: CTA CHEST FINDINGS Cardiovascular: Satisfactory opacification of the pulmonary arteries to the segmental level. No evidence of pulmonary embolism. 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 The heart size appears within normal limits. No pericardial effusion. Normal heart size. No pericardial effusion. Mediastinum/Nodes: No enlarged mediastinal, hilar, or axillary lymph nodes. Thyroid gland, trachea, and esophagus demonstrate no significant findings. Lungs/Pleura: Central airways appear patent. There is no pleural fluid or airspace disease. No signs of pneumothorax. No suspicious pulmonary nodule or mass. Musculoskeletal: No chest wall abnormality. No acute or significant osseous findings. Degenerative changes noted within the thoracic spine. Review of the MIP images confirms the above findings. CT ABDOMEN and PELVIS FINDINGS Hepatobiliary: No suspicious liver lesion. Status post cholecystectomy. Upper limits of normal common bile duct measures 6 mm. No intrahepatic bile duct dilatation. Pancreas: Unremarkable. No pancreatic ductal dilatation or surrounding inflammatory changes. Spleen: Normal in size without focal abnormality. Adrenals/Urinary Tract: Adrenal glands are unremarkable. Kidneys are normal, without renal calculi, focal lesion, or hydronephrosis. Bladder is unremarkable. Stomach/Bowel: Small hiatal hernia. Stomach otherwise normal. The appendix is visualized and appears within normal limits. The proximal small bowel loops appear normal. There are scattered mildly dilated loops of small bowel with air-fluid levels with intervening segments of normal caliber small bowel within the abdomen and pelvis. No significant bowel wall thickening or inflammation identified. The appendix  is visualized and is within normal limits. Vascular/Lymphatic: Normal appearance of the abdominal aorta. The upper abdominal vascularity is patent. No signs of abdominopelvic adenopathy. Reproductive: Status post hysterectomy. No adnexal masses. Other: No free fluid or fluid collections. No signs of pneumoperitoneum. No abdominal wall hernia identified. Musculoskeletal: No acute or significant osseous findings. Review of the MIP images confirms the above findings. IMPRESSION: 1. No evidence for acute pulmonary embolism. 2. 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 3. Multiple mildly dilated loops of small bowel with air-fluid levels, concerning for mild enteritis. No evidence for bowel obstruction. 4. Small hiatal hernia. Electronically Signed   By: Signa Kell M.D.   On: 12/08/2022 07:27   CT ABDOMEN PELVIS W CONTRAST  Result Date: 12/08/2022 CLINICAL DATA:  Chest pain. Elevated blood glucose. Fever and chills with nausea and vomiting for 3 days. EXAM: CT ANGIOGRAPHY CHEST CT ABDOMEN  AND PELVIS WITH CONTRAST TECHNIQUE: Multidetector CT imaging of the chest was performed using the standard protocol during bolus administration of intravenous contrast. Multiplanar CT image reconstructions and MIPs were obtained to evaluate the vascular anatomy. Multidetector CT imaging of the abdomen and pelvis was performed using the standard protocol during bolus administration of intravenous contrast. RADIATION DOSE REDUCTION: This exam was performed according to the departmental dose-optimization program which includes automated exposure control, adjustment of the mA and/or kV according to patient size and/or use of iterative reconstruction technique. CONTRAST:  OMNIPAQUE IOHEXOL 350 MG/ML SOLN COMPARISON:  None Available. FINDINGS: CTA CHEST FINDINGS Cardiovascular: Satisfactory opacification of the pulmonary arteries to the segmental level. No evidence of pulmonary embolism.  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 The heart size appears within normal limits. No pericardial effusion. Normal heart size. No pericardial effusion. Mediastinum/Nodes: No enlarged mediastinal, hilar, or axillary lymph nodes. Thyroid gland, trachea, and esophagus demonstrate no significant findings. Lungs/Pleura: Central airways appear patent. There is no pleural fluid or airspace disease. No signs of pneumothorax. No suspicious pulmonary nodule or mass. Musculoskeletal: No chest wall abnormality. No acute or significant osseous findings. Degenerative changes noted within the thoracic spine. Review of the MIP images confirms the above findings. CT ABDOMEN and PELVIS FINDINGS Hepatobiliary: No suspicious liver lesion. Status post cholecystectomy. Upper limits of normal common bile duct measures 6 mm. No intrahepatic bile duct dilatation. Pancreas: Unremarkable. No pancreatic ductal dilatation or surrounding inflammatory changes. Spleen: Normal in size without focal abnormality. Adrenals/Urinary Tract: Adrenal glands are unremarkable. Kidneys are normal, without renal calculi, focal lesion, or hydronephrosis. Bladder is unremarkable. Stomach/Bowel: Small hiatal hernia. Stomach otherwise normal. The appendix is visualized and appears within normal limits. The proximal small bowel loops appear normal. There are scattered mildly dilated loops of small bowel with air-fluid levels with intervening segments of normal caliber small bowel within the abdomen and pelvis. No significant bowel wall thickening or inflammation identified. The appendix is visualized and is within normal limits. Vascular/Lymphatic: Normal appearance of the abdominal aorta. The upper abdominal vascularity is patent. No signs of abdominopelvic adenopathy. Reproductive: Status post hysterectomy. No adnexal masses. Other: No free fluid or fluid collections. No signs of pneumoperitoneum. No abdominal wall hernia  identified. Musculoskeletal: No acute or significant osseous findings. Review of the MIP images confirms the above findings. IMPRESSION: 1. No evidence for acute pulmonary embolism. 2. 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 3. Multiple mildly dilated loops of small bowel with air-fluid levels, concerning for mild enteritis. No evidence for bowel obstruction. 4. Small hiatal hernia. Electronically Signed   By: Signa Kell M.D.   On: 12/08/2022 07:27   DG Chest 2 View  Result Date: 12/08/2022 CLINICAL DATA:  Fever and chills with nausea and vomiting for 3 days. Chest pain as well. EXAM: CHEST - 2 VIEW COMPARISON:  Radiographs 04/20/2022 FINDINGS: The heart size and mediastinal contours are within normal limits. Both lungs are clear. The visualized skeletal structures are unremarkable. IMPRESSION: No active cardiopulmonary disease. Electronically Signed   By: Minerva Fester M.D.   On: 12/08/2022 01:05    Telemetry    Not on tele   Cardiac Studies   2D Echocardiogram 4.14.2024  pending  Patient Profile     53 y/o ? w/ a h/o DM, obesity, normocytic anemia, B12 deficiency, and uterine fibroids, who was admitted April 13 with a several day history of constant chest wall and epigastric pain, nausea,  vomiting, and normal troponins.  Assessment & Plan    1.  Chest wall tenderness/pleuritic chest pain: Admitted April 13 with several day history of constant chest wall and epigastric tenderness worse with deep breathing, palpation, and upper body movements.  ECG without acute findings.  CTA of the chest/abdomen without PE, though notable for suggestion of pulmonary hypertension, hiatal hernia, and mild enteritis.  No evidence of effusion on CT.  Troponins normal.  Symptoms easily reproducible with light palpation over the precordium/epigastrium on presentation and again this AM.  Sed rate and CRP normal.  Echo pending this morning.  Provided no evidence of  pericardial effusion to suggest pericarditis, would recommend conservative management of musculoskeletal chest pain.  2.  Nausea/vomiting: Mild enteritis and small hiatal hernia noted on CT.  Management per internal medicine.  3.  Type 2 diabetes mellitus: Per internal medicine.  4.  Hyperlipidemia: Continue statin therapy.  5.  Hypokalemia: Potassium again low this morning at 3.2.  Supplementation ordered.  Signed, Nicolasa Ducking, NP  12/09/2022, 8:27 AM    For questions or updates, please contact   Please consult [email or web address] for contact info under Cardiology/STEMI.

## 2022-12-09 NOTE — ED Notes (Signed)
Pt offered nitro for chest pain. Pt declined medication. Pt c/o nasal congestion despite taking flonase and claritin at 0350. Will send message to MD regarding persistent congestion. Pt denies feeling SOB. LS clear and equal bilateral.

## 2022-12-09 NOTE — Progress Notes (Signed)
       CROSS COVER NOTE  NAME: Sharon Marquez MRN: [id number] DOB : 04-04-1971    HPI/Events of Note   Report:night time nasal congestion  On review of chart:    Assessment and  Interventions   Assessment:  Plan: Flonase Home loratidine (claritin substitute) Bnp and mag added to am labs       Donnie Mesa NP [facility]

## 2022-12-09 NOTE — ED Notes (Signed)
Pt ambulatory to and from bathroom independently and without difficulty. Pt sitting up in recliner eating breakfast.

## 2022-12-09 NOTE — Progress Notes (Signed)
EPIC chatted provider inquiring about PRNs for diarrhea as well as if telemetry order is desired to renew

## 2022-12-09 NOTE — Progress Notes (Signed)
PROGRESS NOTE    Sharon Marquez  XBJ:[id number] DOB: Jun 04, 1971 DOA: 12/08/2022 PCP: [facility]    Brief Narrative:  This 52 years old female with PMH significant for DM, anemia, obesity, vitamin B12 deficiency, fibroid(s/p hysterectomy) presented in the ED with complaints of chest pain associated with N/V/D.  Patient reports she started with nausea vomiting and diarrhea that lasted 5 days associated with subjective fever and chills.  She also reports having central chest pain.  Patient describes chest pain as constant,  pressure-like , Nonradiating associated with shortness of breath.  Workup in the ED RSV, influenza, COVID-negative.  UA unremarkable.  Chest x-ray negative.  CT abdomen pelvis is negative for SBO but showed mild enteritis.  CTA chest was negative for PE but does show possible pulmonary hypertension.  Patient was admitted for further evaluation and cardiology was consulted.  Assessment & Plan:   Principal Problem:   Chest pain Active Problems:   Nausea vomiting and diarrhea   Diabetes mellitus without complication   Hypokalemia   Hypomagnesemia   HLD (hyperlipidemia)   Obesity (BMI 30-39.9)   Enteritis  Chest pain, atypical: Patient presented with chest pain, Likely musculoskeletal  CTA chest negative for PE.  Chest x-ray unremarkable. Patient reports persistent pressure-like chest pain,  reproducible. Troponin x 2 negative. Continue adequate pain control with fentanyl and nitroglycerin Continue aspirin and Lipitor Obtain urine drug screen Obtain 2D echocardiogram Cardiology was consulted.  EKG without ischemic changes. CRP and ESR normal.  Echo is pending. No evidence to suggest pericarditis.  Would recommend conservative management for musculoskeletal chest pain.  Hyperlipidemia Continue Lipitor 40 mg daily  Nausea vomiting and diarrhea: CT A/P negative for obstruction possible likely viral gastroenteritis. Continue IV fluid  resuscitation. Check C. difficile and GI panel. Continue as needed Zofran.  Hypokalemia / Hypomagnesemia: Replaced.  Continue to monitor  Diabetes mellitus: Last hemoglobin A1c 6.6 which is well-controlled metformin Ozempic and Tresiba at home. Hold p.o. diabetic medications Continue Semglee 30 units daily Continue regular insulin sliding scale  Obesity: Patient is taking phentermine but he stopped taking it 5 days ago due to chest pain. Diet and exercise discussed in detail.  DVT prophylaxis: Lovenox Code Status: Full code Family Communication: No family at bed side. Disposition Plan:    Status is: Observation The patient remains OBS appropriate and will d/c before 2 midnights.   Admitted for chest pain likely atypical due to musculoskeletal pain.  Echocardiogram is pending,  cardiology is on board.  Consultants:  Cardiology  Procedures:Echocardiogram  Antimicrobials:  Anti-infectives (From admission, onward)    None      Subjective: Patient was seen and examined at bedside.  Overnight events noted.   Patient continued to report chest pain,  states which is worse when she takes deep breath.   Imaging unremarkable.  Echocardiogram is pending.  Objective: Vitals:   12/09/22 0407 12/09/22 0750 12/09/22 0800 12/09/22 1114  BP: 130/75 125/79 120/75 110/72  Pulse: 74 86 84 81  Resp: 16 17  17   Temp: 98.2 F (36.8 C)     TempSrc: Oral     SpO2: 100% 99% 100% 100%  Weight:      Height:        Intake/Output Summary (Last 24 hours) at 12/09/2022 1128 Last data filed at 12/08/2022 1727 Gross per 24 hour  Intake 645 ml  Output --  Net 645 ml   Filed Weights   12/08/22 0018  Weight: 98.9 kg  Examination:  General exam: Appears calm and comfortable, not in any acute distress Respiratory system: Clear to auscultation. Respiratory effort normal.  RR 16 Chest wall: Tenderness noted,  Cardiovascular system: S1 & S2 heard, regular rate and rhythm, no  murmur. Gastrointestinal system: Abdomen is soft, non tender, non distended, BS+ Central nervous system: Alert and oriented x 3. No focal neurological deficits. Extremities: No edema, No cyanosis, No clubbing Skin: No rashes, lesions or ulcers Psychiatry: Judgement and insight appear normal. Mood & affect appropriate.     Data Reviewed: I have personally reviewed following labs and imaging studies  CBC: Recent Labs  Lab 12/08/22 0032 12/09/22 0349  WBC 4.7 3.9*  HGB 12.5 11.0*  HCT 39.3 33.8*  MCV 89.9 90.6  PLT 304 255   Basic Metabolic Panel: Recent Labs  Lab 12/08/22 0032 12/08/22 0833 12/08/22 1011 12/09/22 0349  NA 139  --   --  137  K 3.0*  --   --  3.2*  CL 100  --   --  105  CO2 26  --   --  25  GLUCOSE 278*  --   --  183*  BUN 15  --   --  10  CREATININE 0.85  --   --  0.53  CALCIUM 9.2  --   --  8.2*  MG  --  1.6*  --  2.0  PHOS  --   --  2.7  --    GFR: Estimated Creatinine Clearance: 93.2 mL/min (by C-G formula based on SCr of 0.53 mg/dL). Liver Function Tests: Recent Labs  Lab 12/08/22 0307  AST 21  ALT 20  ALKPHOS 76  BILITOT 0.5  PROT 7.9  ALBUMIN 3.9   Recent Labs  Lab 12/08/22 0032  LIPASE 23   No results for input(s): "AMMONIA" in the last 168 hours. Coagulation Profile: Recent Labs  Lab 12/08/22 0833  INR 1.2   Cardiac Enzymes: No results for input(s): "CKTOTAL", "CKMB", "CKMBINDEX", "TROPONINI" in the last 168 hours. BNP (last 3 results) No results for input(s): "PROBNP" in the last 8760 hours. HbA1C: Recent Labs    12/08/22 0031  HGBA1C 8.0*   CBG: Recent Labs  Lab 12/08/22 1343 12/08/22 1704 12/08/22 2306 12/09/22 0855  GLUCAP 107* 150* 192* 224*   Lipid Profile: Recent Labs    12/09/22 0349  CHOL 101  HDL 41  LDLCALC 44  TRIG 80  CHOLHDL 2.5   Thyroid Function Tests: No results for input(s): "TSH", "T4TOTAL", "FREET4", "T3FREE", "THYROIDAB" in the last 72 hours. Anemia Panel: No results for  input(s): "VITAMINB12", "FOLATE", "FERRITIN", "TIBC", "IRON", "RETICCTPCT" in the last 72 hours. Sepsis Labs: No results for input(s): "PROCALCITON", "LATICACIDVEN" in the last 168 hours.  Recent Results (from the past 240 hour(s))  Resp panel by RT-PCR (RSV, Flu A&B, Covid) Anterior Nasal Swab     Status: None   Collection Time: 12/08/22 12:32 AM   Specimen: Anterior Nasal Swab  Result Value Ref Range Status   SARS Coronavirus 2 by RT PCR NEGATIVE NEGATIVE Final    Comment: (NOTE) SARS-CoV-2 target nucleic acids are NOT DETECTED.  The SARS-CoV-2 RNA is generally detectable in upper respiratory specimens during the acute phase of infection. The lowest concentration of SARS-CoV-2 viral copies this assay can detect is 138 copies/mL. A negative result does not preclude SARS-Cov-2 infection and should not be used as the sole basis for treatment or other patient management decisions. A negative result may occur with  improper specimen  collection/handling, submission of specimen other than nasopharyngeal swab, presence of viral mutation(s) within the areas targeted by this assay, and inadequate number of viral copies(<138 copies/mL). A negative result must be combined with clinical observations, patient history, and epidemiological information. The expected result is Negative.  Fact Sheet for Patients:  BloggerCourse.com  Fact Sheet for Healthcare Providers:  SeriousBroker.it  This test is no t yet approved or cleared by the [place] FDA and  has been authorized for detection and/or diagnosis of SARS-CoV-2 by FDA under an Emergency Use Authorization (EUA). This EUA will remain  in effect (meaning this test can be used) for the duration of the COVID-19 declaration under Section 564(b)(1) of the Act, 21 U.S.C.section 360bbb-3(b)(1), unless the authorization is terminated  or revoked sooner.       Influenza A by PCR NEGATIVE  NEGATIVE Final   Influenza B by PCR NEGATIVE NEGATIVE Final    Comment: (NOTE) The Xpert Xpress SARS-CoV-2/FLU/RSV plus assay is intended as an aid in the diagnosis of influenza from Nasopharyngeal swab specimens and should not be used as a sole basis for treatment. Nasal washings and aspirates are unacceptable for Xpert Xpress SARS-CoV-2/FLU/RSV testing.  Fact Sheet for Patients: BloggerCourse.com  Fact Sheet for Healthcare Providers: SeriousBroker.it  This test is not yet approved or cleared by the [place] FDA and has been authorized for detection and/or diagnosis of SARS-CoV-2 by FDA under an Emergency Use Authorization (EUA). This EUA will remain in effect (meaning this test can be used) for the duration of the COVID-19 declaration under Section 564(b)(1) of the Act, 21 U.S.C. section 360bbb-3(b)(1), unless the authorization is terminated or revoked.     Resp Syncytial Virus by PCR NEGATIVE NEGATIVE Final    Comment: (NOTE) Fact Sheet for Patients: BloggerCourse.com  Fact Sheet for Healthcare Providers: SeriousBroker.it  This test is not yet approved or cleared by the [place] FDA and has been authorized for detection and/or diagnosis of SARS-CoV-2 by FDA under an Emergency Use Authorization (EUA). This EUA will remain in effect (meaning this test can be used) for the duration of the COVID-19 declaration under Section 564(b)(1) of the Act, 21 U.S.C. section 360bbb-3(b)(1), unless the authorization is terminated or revoked.  Performed at [facility], [address] [place], [place] [address]   C Difficile Quick Screen w PCR reflex     Status: None   Collection Time: 12/08/22  3:38 PM   Specimen: STOOL  Result Value Ref Range Status   C Diff antigen NEGATIVE NEGATIVE Final   C Diff toxin NEGATIVE NEGATIVE Final   C Diff interpretation No C.  difficile detected.  Final    Comment: Performed at [facility], [address] [place], [place] [address]  Gastrointestinal Panel by PCR , Stool     Status: None   Collection Time: 12/08/22  3:38 PM   Specimen: STOOL  Result Value Ref Range Status   Campylobacter species NOT DETECTED NOT DETECTED Final   Plesimonas shigelloides NOT DETECTED NOT DETECTED Final   Salmonella species NOT DETECTED NOT DETECTED Final   Yersinia enterocolitica NOT DETECTED NOT DETECTED Final   Vibrio species NOT DETECTED NOT DETECTED Final   Vibrio cholerae NOT DETECTED NOT DETECTED Final   Enteroaggregative E coli (EAEC) NOT DETECTED NOT DETECTED Final   Enteropathogenic E coli (EPEC) NOT DETECTED NOT DETECTED Final   Enterotoxigenic E coli (ETEC) NOT DETECTED NOT DETECTED Final   Shiga like toxin producing E coli (STEC) NOT DETECTED NOT  DETECTED Final   Shigella/Enteroinvasive E coli (EIEC) NOT DETECTED NOT DETECTED Final   Cryptosporidium NOT DETECTED NOT DETECTED Final   Cyclospora cayetanensis NOT DETECTED NOT DETECTED Final   Entamoeba histolytica NOT DETECTED NOT DETECTED Final   Giardia lamblia NOT DETECTED NOT DETECTED Final   Adenovirus F40/41 NOT DETECTED NOT DETECTED Final   Astrovirus NOT DETECTED NOT DETECTED Final   Norovirus GI/GII NOT DETECTED NOT DETECTED Final   Rotavirus A NOT DETECTED NOT DETECTED Final   Sapovirus (I, II, IV, and V) NOT DETECTED NOT DETECTED Final    Comment: Performed at [facility], [address], [place], [place] [address]    Radiology Studies: CT Angio Chest PE W/Cm &/Or Wo Cm  Result Date: 12/08/2022 CLINICAL DATA:  Chest pain. Elevated blood glucose. Fever and chills with nausea and vomiting for 3 days. EXAM: CT ANGIOGRAPHY CHEST CT ABDOMEN AND PELVIS WITH CONTRAST TECHNIQUE: Multidetector CT imaging of the chest was performed using the standard protocol during bolus administration of intravenous contrast. Multiplanar CT image  reconstructions and MIPs were obtained to evaluate the vascular anatomy. Multidetector CT imaging of the abdomen and pelvis was performed using the standard protocol during bolus administration of intravenous contrast. RADIATION DOSE REDUCTION: This exam was performed according to the departmental dose-optimization program which includes automated exposure control, adjustment of the mA and/or kV according to patient size and/or use of iterative reconstruction technique. CONTRAST:  OMNIPAQUE IOHEXOL 350 MG/ML SOLN COMPARISON:  None Available. FINDINGS: CTA CHEST FINDINGS Cardiovascular: Satisfactory opacification of the pulmonary arteries to the segmental level. No evidence of pulmonary embolism. 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 The heart size appears within normal limits. No pericardial effusion. Normal heart size. No pericardial effusion. Mediastinum/Nodes: No enlarged mediastinal, hilar, or axillary lymph nodes. Thyroid gland, trachea, and esophagus demonstrate no significant findings. Lungs/Pleura: Central airways appear patent. There is no pleural fluid or airspace disease. No signs of pneumothorax. No suspicious pulmonary nodule or mass. Musculoskeletal: No chest wall abnormality. No acute or significant osseous findings. Degenerative changes noted within the thoracic spine. Review of the MIP images confirms the above findings. CT ABDOMEN and PELVIS FINDINGS Hepatobiliary: No suspicious liver lesion. Status post cholecystectomy. Upper limits of normal common bile duct measures 6 mm. No intrahepatic bile duct dilatation. Pancreas: Unremarkable. No pancreatic ductal dilatation or surrounding inflammatory changes. Spleen: Normal in size without focal abnormality. Adrenals/Urinary Tract: Adrenal glands are unremarkable. Kidneys are normal, without renal calculi, focal lesion, or hydronephrosis. Bladder is unremarkable. Stomach/Bowel: Small hiatal hernia. Stomach  otherwise normal. The appendix is visualized and appears within normal limits. The proximal small bowel loops appear normal. There are scattered mildly dilated loops of small bowel with air-fluid levels with intervening segments of normal caliber small bowel within the abdomen and pelvis. No significant bowel wall thickening or inflammation identified. The appendix is visualized and is within normal limits. Vascular/Lymphatic: Normal appearance of the abdominal aorta. The upper abdominal vascularity is patent. No signs of abdominopelvic adenopathy. Reproductive: Status post hysterectomy. No adnexal masses. Other: No free fluid or fluid collections. No signs of pneumoperitoneum. No abdominal wall hernia identified. Musculoskeletal: No acute or significant osseous findings. Review of the MIP images confirms the above findings. IMPRESSION: 1. No evidence for acute pulmonary embolism. 2. 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 3. Multiple mildly dilated loops of small bowel with air-fluid levels, concerning for mild enteritis. No evidence for bowel obstruction. 4. Small  hiatal hernia. Electronically Signed   By: Signa Kell M.D.   On: 12/08/2022 07:27   CT ABDOMEN PELVIS W CONTRAST  Result Date: 12/08/2022 CLINICAL DATA:  Chest pain. Elevated blood glucose. Fever and chills with nausea and vomiting for 3 days. EXAM: CT ANGIOGRAPHY CHEST CT ABDOMEN AND PELVIS WITH CONTRAST TECHNIQUE: Multidetector CT imaging of the chest was performed using the standard protocol during bolus administration of intravenous contrast. Multiplanar CT image reconstructions and MIPs were obtained to evaluate the vascular anatomy. Multidetector CT imaging of the abdomen and pelvis was performed using the standard protocol during bolus administration of intravenous contrast. RADIATION DOSE REDUCTION: This exam was performed according to the departmental dose-optimization program which includes  automated exposure control, adjustment of the mA and/or kV according to patient size and/or use of iterative reconstruction technique. CONTRAST:  OMNIPAQUE IOHEXOL 350 MG/ML SOLN COMPARISON:  None Available. FINDINGS: CTA CHEST FINDINGS Cardiovascular: Satisfactory opacification of the pulmonary arteries to the segmental level. No evidence of pulmonary embolism. 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 The heart size appears within normal limits. No pericardial effusion. Normal heart size. No pericardial effusion. Mediastinum/Nodes: No enlarged mediastinal, hilar, or axillary lymph nodes. Thyroid gland, trachea, and esophagus demonstrate no significant findings. Lungs/Pleura: Central airways appear patent. There is no pleural fluid or airspace disease. No signs of pneumothorax. No suspicious pulmonary nodule or mass. Musculoskeletal: No chest wall abnormality. No acute or significant osseous findings. Degenerative changes noted within the thoracic spine. Review of the MIP images confirms the above findings. CT ABDOMEN and PELVIS FINDINGS Hepatobiliary: No suspicious liver lesion. Status post cholecystectomy. Upper limits of normal common bile duct measures 6 mm. No intrahepatic bile duct dilatation. Pancreas: Unremarkable. No pancreatic ductal dilatation or surrounding inflammatory changes. Spleen: Normal in size without focal abnormality. Adrenals/Urinary Tract: Adrenal glands are unremarkable. Kidneys are normal, without renal calculi, focal lesion, or hydronephrosis. Bladder is unremarkable. Stomach/Bowel: Small hiatal hernia. Stomach otherwise normal. The appendix is visualized and appears within normal limits. The proximal small bowel loops appear normal. There are scattered mildly dilated loops of small bowel with air-fluid levels with intervening segments of normal caliber small bowel within the abdomen and pelvis. No significant bowel wall thickening or  inflammation identified. The appendix is visualized and is within normal limits. Vascular/Lymphatic: Normal appearance of the abdominal aorta. The upper abdominal vascularity is patent. No signs of abdominopelvic adenopathy. Reproductive: Status post hysterectomy. No adnexal masses. Other: No free fluid or fluid collections. No signs of pneumoperitoneum. No abdominal wall hernia identified. Musculoskeletal: No acute or significant osseous findings. Review of the MIP images confirms the above findings. IMPRESSION: 1. No evidence for acute pulmonary embolism. 2. 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 3. Multiple mildly dilated loops of small bowel with air-fluid levels, concerning for mild enteritis. No evidence for bowel obstruction. 4. Small hiatal hernia. Electronically Signed   By: Signa Kell M.D.   On: 12/08/2022 07:27   DG Chest 2 View  Result Date: 12/08/2022 CLINICAL DATA:  Fever and chills with nausea and vomiting for 3 days. Chest pain as well. EXAM: CHEST - 2 VIEW COMPARISON:  Radiographs 04/20/2022 FINDINGS: The heart size and mediastinal contours are within normal limits. Both lungs are clear. The visualized skeletal structures are unremarkable. IMPRESSION: No active cardiopulmonary disease. Electronically Signed   By: Minerva Fester M.D.   On: 12/08/2022 01:05    Scheduled Meds:  aspirin EC  81  mg Oral Daily   atorvastatin  40 mg Oral Daily   enoxaparin (LOVENOX) injection  0.5 mg/kg Subcutaneous Q24H   fluticasone  2 spray Each Nare Daily   insulin aspart  0-5 Units Subcutaneous QHS   insulin aspart  0-9 Units Subcutaneous TID WC   insulin glargine-yfgn  30 Units Subcutaneous QHS   loratadine  10 mg Oral Daily   Continuous Infusions:  sodium chloride 75 mL/hr at 12/09/22 1004     LOS: 0 days    Time spent: 50 mins    Willeen Niece, MD [facility]   If 7PM-7AM, please contact night-coverage

## 2022-12-09 NOTE — ED Notes (Signed)
Advised nurse that patient has ready bed 

## 2022-12-10 DIAGNOSIS — K529 Noninfective gastroenteritis and colitis, unspecified: Secondary | ICD-10-CM

## 2022-12-10 DIAGNOSIS — R0789 Other chest pain: Secondary | ICD-10-CM | POA: Diagnosis not present

## 2022-12-10 LAB — BASIC METABOLIC PANEL
Anion gap: 9 (ref 5–15)
BUN: 8 mg/dL (ref 6–20)
CO2: 25 mmol/L (ref 22–32)
Calcium: 8.4 mg/dL — ABNORMAL LOW (ref 8.9–10.3)
Chloride: 104 mmol/L (ref 98–111)
Creatinine, Ser: 0.53 mg/dL (ref 0.44–1.00)
GFR, Estimated: >60 mL/min (ref >60)
Glucose, Bld: 237 mg/dL — ABNORMAL HIGH (ref 70–99)
Potassium: 3.6 mmol/L (ref 3.5–5.1)
Sodium: 138 mmol/L (ref 135–145)

## 2022-12-10 LAB — PHOSPHORUS: Phosphorus: 3.7 mg/dL (ref 2.5–4.6)

## 2022-12-10 LAB — CBC
HCT: 34.5 % — ABNORMAL LOW (ref 36.0–46.0)
Hemoglobin: 11.4 g/dL — ABNORMAL LOW (ref 12.0–15.0)
MCH: 29.4 pg (ref 26.0–34.0)
MCHC: 33 g/dL (ref 30.0–36.0)
MCV: 88.9 fL (ref 80.0–100.0)
Platelets: 236 10*3/uL (ref 150–400)
RBC: 3.88 MIL/uL (ref 3.87–5.11)
RDW: 15.4 % (ref 11.5–15.5)
WBC: 4.2 10*3/uL (ref 4.0–10.5)
nRBC: 0 % (ref 0.0–0.2)

## 2022-12-10 LAB — GLUCOSE, CAPILLARY
Glucose-Capillary: 176 mg/dL — ABNORMAL HIGH (ref 70–99)
Glucose-Capillary: 228 mg/dL — ABNORMAL HIGH (ref 70–99)
Glucose-Capillary: 151 mg/dL — ABNORMAL HIGH (ref 70–99)
Glucose-Capillary: 217 mg/dL — ABNORMAL HIGH (ref 70–99)

## 2022-12-10 LAB — MAGNESIUM: Magnesium: 1.9 mg/dL (ref 1.7–2.4)

## 2022-12-10 MED ORDER — PANTOPRAZOLE SODIUM 40 MG IV SOLR
40.0000 mg | Freq: Two times a day (BID) | INTRAVENOUS | Status: DC
  Administered 2022-12-10 – 2022-12-11 (×3): 40 mg via INTRAVENOUS
  Filled 2022-12-10 (×3): qty 10

## 2022-12-10 NOTE — Consult Note (Signed)
Midge Minium, MD [facility]  [address], [address] [place], [place] [address] Phone: [phone] Fax : [phone]  Consultation  Referring Provider:     Dr. Idelle Leech Primary Care Physician:  [facility] Primary Gastroenterologist: Gentry Fitz         Reason for Consultation:     Chest pain  Date of Admission:  12/08/2022 Date of Consultation:  12/10/2022         HPI:   Sharon Marquez is a 52 y.o. female who was admitted with signs and symptoms of enteritis with a CT scan showing enteritis.  The patient reports that she was having loose bowel movements that got better with Imodium and she was also having a cough with being told that her musculoskeletal chest pain was likely from her repeated coughing. In addition to the chest pain and diarrhea the patient also reported nausea and vomiting.  She reports that this has been going on for 5 days and she felt like she had a temperature although she did not check her temperature.  The patient reports that her chest pain and her shortness of breath happen when she sits up out of bed and walks across the room to go to the bathroom.  She reports that she has been feeling better since being in the hospital and is tolerating p.o.'s better today.  The CT scan showed multiple mildly dilated loops of small bowel with air-fluid levels concerning for mild enteritis without any evidence of bowel obstruction with a small hiatal hernia and increased caliber of the main pulmonary artery compatible with pulmonary artery hypertension.  Past Medical History:  Diagnosis Date   Diabetes mellitus without complication    Family history of adverse reaction to anesthesia    "mother has difficulty waking up after surgery ."   Fibroid uterus 2014   History of blood transfusion 2014, 2015   for symptomatic anemia   Normocytic anemia    Synovitis of wrist    left   Vitamin B deficiency     Past Surgical History:  Procedure Laterality Date    ABDOMINAL HYSTERECTOMY     CHOLECYSTECTOMY     LAPAROSCOPIC VAGINAL HYSTERECTOMY WITH SALPINGECTOMY Bilateral 07/15/2017   Procedure: LAPAROSCOPIC ASSISTED VAGINAL HYSTERECTOMY WITH BILATERAL SALPINGECTOMY;  Surgeon: Hildred Laser, MD;  Location: [facility];  Service: Gynecology;  Laterality: Bilateral;   TENDON TRANSFER Left 06/06/2018   Procedure: Repair / Reconstruction, Open Extensor Carpi Ulnaris Debridement Repair and Stabilization;  Surgeon: Dominica Severin, MD;  Location: [facility];  Service: Orthopedics;  Laterality: Left;   WRIST ARTHROSCOPY Left 06/06/2018   Procedure: Left wrist arthroscopy with synovectomy;  Surgeon: Dominica Severin, MD;  Location: [facility];  Service: Orthopedics;  Laterality: Left;  90 mins    Prior to Admission medications   Medication Sig Start Date End Date Taking? Authorizing Provider  albuterol (VENTOLIN HFA) 108 (90 Base) MCG/ACT inhaler Inhale 1-2 puffs into the lungs every 6 (six) hours as needed for wheezing or shortness of breath. 02/24/22  Yes Baity, Salvadore Oxford, NP  atorvastatin (LIPITOR) 40 MG tablet Take 40 mg by mouth daily.   Yes [provider]  guaiFENesin-codeine 100-10 MG/5ML syrup Take 5 mLs by mouth every 6 (six) hours as needed for cough. 05/05/19  Yes Minna Antis, MD  metFORMIN (GLUCOPHAGE) 1000 MG tablet Take 1 tablet (1,000 mg total) by mouth 2 (two) times daily with a meal. Patient taking differently: Take 500 mg  by mouth 2 (two) times daily with a meal. Taking two 500 mg tablets 2x daily, taken in the morning and evening 05/05/19 12/08/22 Yes Paduchowski, Caryn Bee, MD  OZEMPIC, 2 MG/DOSE, 8 MG/3ML SOPN Inject 2 mg as directed once a week.   Yes [provider]  phentermine (ADIPEX-P) 37.5 MG tablet Take 37.5 mg by mouth daily.   Yes [provider]  TRESIBA FLEXTOUCH 100 UNIT/ML FlexTouch Pen Inject 40 Units into the skin at bedtime. 11/21/22  Yes [provider]  cetirizine  (ZYRTEC) 10 MG tablet TAKE 1 TABLET BY MOUTH EVERY DAY Patient not taking: Reported on 12/08/2022 03/10/19   Hildred Laser, MD  predniSONE (DELTASONE) 10 MG tablet Take 1 tablet (10 mg total) by mouth daily. Patient not taking: Reported on 12/08/2022 02/24/22   Lorre Munroe, NP    Family History  Problem Relation Age of Onset   Hyperthyroidism Mother    Diabetes Mother    Hypertension Mother    Hyperthyroidism Maternal Aunt    Diabetes Maternal Grandmother    Diabetes Maternal Uncle    Diabetes Maternal Grandfather      Social History   Tobacco Use   Smoking status: Never   Smokeless tobacco: Never  Vaping Use   Vaping Use: Never used  Substance Use Topics   Alcohol use: No   Drug use: No    Allergies as of 12/08/2022 - Review Complete 12/08/2022  Allergen Reaction Noted   Meloxicam Itching 08/02/2016   Tramadol Itching and Swelling 08/22/2016    Review of Systems:    All systems reviewed and negative except where noted in HPI.   Physical Exam:  Vital signs in last 24 hours: Temp:  [97.9 F (36.6 C)-99.3 F (37.4 C)] 98.1 F (36.7 C) (04/15 1552) Pulse Rate:  [78-86] 78 (04/15 1552) Resp:  [14-22] 16 (04/15 1552) BP: (113-140)/(73-87) 113/73 (04/15 1552) SpO2:  [98 %-100 %] 100 % (04/15 1552) Weight:  [102.8 kg] 102.8 kg (04/15 0500) Last BM Date : 12/09/22 General:   Pleasant, cooperative in NAD Head:  Normocephalic and atraumatic. Eyes:   No icterus.   Conjunctiva pink. PERRLA. Ears:  Normal auditory acuity. Neck:  Supple; no masses or thyroidomegaly Lungs: Respirations even and unlabored. Lungs clear to auscultation bilaterally.   No wheezes, crackles, or rhonchi.  Heart:  Regular rate and rhythm;  Without murmur, clicks, rubs or gallops Abdomen:  Soft, nondistended, nontender. Normal bowel sounds. No appreciable masses or hepatomegaly.  No rebound or guarding.  Rectal:  Not performed. Msk:  Symmetrical without gross deformities.  Positive tenderness on the  ribs over her chest Extremities:  Without edema, cyanosis or clubbing. Neurologic:  Alert and oriented x3;  grossly normal neurologically. Skin:  Intact without significant lesions or rashes. Cervical Nodes:  No significant cervical adenopathy. Psych:  Alert and cooperative. Normal affect.  LAB RESULTS: Recent Labs    12/08/22 0032 12/09/22 0349 12/10/22 0544  WBC 4.7 3.9* 4.2  HGB 12.5 11.0* 11.4*  HCT 39.3 33.8* 34.5*  PLT 304 255 236   BMET Recent Labs    12/08/22 0032 12/09/22 0349 12/10/22 0544  NA 139 137 138  K 3.0* 3.2* 3.6  CL 100 105 104  CO2 26 25 25   GLUCOSE 278* 183* 237*  BUN 15 10 8   CREATININE 0.85 0.53 0.53  CALCIUM 9.2 8.2* 8.4*   LFT Recent Labs    12/08/22 0307  PROT 7.9  ALBUMIN 3.9  AST 21  ALT 20  ALKPHOS 76  BILITOT 0.5  BILIDIR <0.1  IBILI NOT CALCULATED   PT/INR Recent Labs    12/08/22 0833  LABPROT 14.8  INR 1.2    STUDIES: ECHOCARDIOGRAM COMPLETE  Result Date: 12/09/2022    ECHOCARDIOGRAM REPORT   Patient Name:   Sharon Marquez [facility] Date of Exam: 12/09/2022 Medical Rec #:  [id number]      Height:       63.0 in Accession #:    [id number]     Weight:       218.0 lb Date of Birth:  [birth year]      BSA:          2.006 m Patient Age:    51 years       BP:           130/75 mmHg Patient Gender: F              HR:           74 bpm. Exam Location:  [facility] Procedure: 2D Echo Indications:     Chest Pain R07.9  History:         Patient has no prior history of Echocardiogram examinations.  Sonographer:     Overton Mam RDCS Referring Phys:  [address] BERGE Diagnosing Phys: Chilton Si MD IMPRESSIONS  1. Left ventricular ejection fraction, by estimation, is 50 to 55%. The left ventricle has low normal function. The left ventricle has no regional wall motion abnormalities. There is mild left ventricular hypertrophy of the septal segment. Left ventricular diastolic parameters are consistent with Grade I diastolic dysfunction (impaired  relaxation).  2. Right ventricular systolic function is normal. The right ventricular size is normal.  3. The mitral valve is normal in structure. Trivial mitral valve regurgitation. No evidence of mitral stenosis.  4. The aortic valve is tricuspid. Aortic valve regurgitation is not visualized. No aortic stenosis is present.  5. The inferior vena cava is normal in size with <50% respiratory variability, suggesting right atrial pressure of 8 mmHg. FINDINGS  Left Ventricle: Left ventricular ejection fraction, by estimation, is 50 to 55%. The left ventricle has low normal function. The left ventricle has no regional wall motion abnormalities. The left ventricular internal cavity size was normal in size. There is mild left ventricular hypertrophy of the septal segment. Left ventricular diastolic parameters are consistent with Grade I diastolic dysfunction (impaired relaxation). Normal left ventricular filling pressure. Right Ventricle: The right ventricular size is normal. No increase in right ventricular wall thickness. Right ventricular systolic function is normal. Left Atrium: Left atrial size was normal in size. Right Atrium: Right atrial size was normal in size. Pericardium: There is no evidence of pericardial effusion. Mitral Valve: The mitral valve is normal in structure. Trivial mitral valve regurgitation. No evidence of mitral valve stenosis. Tricuspid Valve: The tricuspid valve is normal in structure. Tricuspid valve regurgitation is not demonstrated. No evidence of tricuspid stenosis. Aortic Valve: The aortic valve is tricuspid. Aortic valve regurgitation is not visualized. No aortic stenosis is present. Aortic valve peak gradient measures 8.0 mmHg. Pulmonic Valve: The pulmonic valve was normal in structure. Pulmonic valve regurgitation is not visualized. No evidence of pulmonic stenosis. Aorta: The aortic root is normal in size and structure. Venous: The inferior vena cava is normal in size with less than  50% respiratory variability, suggesting right atrial pressure of 8 mmHg. IAS/Shunts: No atrial level shunt detected by color flow Doppler.  LEFT VENTRICLE PLAX 2D LVIDd:  4.70 cm     Diastology LVIDs:         3.20 cm     LV e' medial:    6.42 cm/s LV PW:         1.00 cm     LV E/e' medial:  9.6 LV IVS:        1.10 cm     LV e' lateral:   11.20 cm/s LVOT diam:     2.10 cm     LV E/e' lateral: 5.5 LV SV:         68 LV SV Index:   34 LVOT Area:     3.46 cm  LV Volumes (MOD) LV vol d, MOD A4C: 83.3 ml LV vol s, MOD A4C: 36.4 ml LV SV MOD A4C:     83.3 ml RIGHT VENTRICLE RV Basal diam:  2.90 cm RV S prime:     14.00 cm/s LEFT ATRIUM             Index        RIGHT ATRIUM           Index LA diam:        4.40 cm 2.19 cm/m   RA Area:     13.80 cm LA Vol (A2C):   44.3 ml 22.09 ml/m  RA Volume:   30.60 ml  15.26 ml/m LA Vol (A4C):   59.1 ml 29.46 ml/m LA Biplane Vol: 53.2 ml 26.52 ml/m  AORTIC VALVE                 PULMONIC VALVE AV Area (Vmax): 2.04 cm     PV Vmax:       1.28 m/s AV Vmax:        141.00 cm/s  PV Peak grad:  6.6 mmHg AV Peak Grad:   8.0 mmHg LVOT Vmax:      83.00 cm/s LVOT Vmean:     55.900 cm/s LVOT VTI:       0.195 m  AORTA Ao Root diam: 2.80 cm Ao Asc diam:  2.90 cm MITRAL VALVE MV Area (PHT): 3.66 cm    SHUNTS MV Decel Time: 207 msec    Systemic VTI:  0.20 m MV E velocity: 61.80 cm/s  Systemic Diam: 2.10 cm MV A velocity: 81.80 cm/s MV E/A ratio:  0.76 Chilton Si MD Electronically signed by Chilton Si MD Signature Date/Time: 12/09/2022/11:51:56 AM    Final       Impression / Plan:   Assessment: Principal Problem:   Atypical chest pain Active Problems:   Nausea vomiting and diarrhea   Diabetes mellitus without complication   Hypokalemia   Obesity (BMI 30-39.9)   HLD (hyperlipidemia)   Hypomagnesemia   Enteritis   Sharon Marquez is a 52 y.o. y/o female with what sounds like an acute viral illness with nausea vomiting diarrhea.  The CT scan also showed enteritis.   The patient has been slowly improving.  There is no role for any GI intervention with a colonoscopy or EGD at this time.  Plan:  The patient should be treated symptomatically for what appears to be viral gastroenteritis.  Since her symptoms are including both upper GI symptoms and lower GI symptoms with a perceived fever and imaging that shows enteritis would be the reason I suggest conservative therapy and supportive care.  The patient has been given my card and has been told that she can follow-up after discharge if she is having any further symptoms.  I have also discussed the plan with her daughter over the phone and the family agrees with our plan.  Nothing further to do from a GI point of view.  I will sign off.  Please call if any further GI concerns or questions.  We would like to thank you for the opportunity to participate in the care of Sharon Marquez.   Thank you for involving me in the care of this patient.      LOS: 0 days   Midge Minium, MD, [facility] 12/10/2022, 6:17 PM,  Pager [phone] 7am-5pm  Check AMION for 5pm -7am coverage and on weekends   Note: This dictation was prepared with Dragon dictation along with smaller phrase technology. Any transcriptional errors that result from this process are unintentional.

## 2022-12-10 NOTE — Plan of Care (Signed)

## 2022-12-10 NOTE — Progress Notes (Signed)
PROGRESS NOTE    Sharon Marquez  AVW:[id number] DOB: 27-Jun-1971 DOA: 12/08/2022 PCP: [facility]    Brief Narrative:  This 52 years old female with PMH significant for DM, anemia, obesity, vitamin B12 deficiency, fibroid(s/p hysterectomy) presented in the ED with complaints of chest pain associated with N/V/D.  Patient reports she started with nausea vomiting and diarrhea that lasted 5 days associated with subjective fever and chills.  She also reports having central chest pain.  Patient describes chest pain as constant,  pressure-like , Nonradiating associated with shortness of breath.  Workup in the ED RSV, influenza, COVID-negative.  UA unremarkable.  Chest x-ray negative.  CT abdomen pelvis is negative for SBO but showed mild enteritis.  CTA chest was negative for PE but does show possible pulmonary hypertension.  Patient was admitted for further evaluation and cardiology was consulted.  Assessment & Plan:   Principal Problem:   Atypical chest pain Active Problems:   Nausea vomiting and diarrhea   Diabetes mellitus without complication   Hypokalemia   Hypomagnesemia   HLD (hyperlipidemia)   Obesity (BMI 30-39.9)   Enteritis  Chest pain, atypical: Patient presented with chest pain, Likely musculoskeletal  CTA chest negative for PE.  Chest x-ray unremarkable. Patient reports persistent pressure-like chest pain,  reproducible. Troponin x 2 negative. Continue adequate pain control with fentanyl and nitroglycerin Continue aspirin and Lipitor UDS > Opioids + 2D echo shows LVEF 50 to 55%, left ventricle has low normal function no RWMA.  No pericardial effusion noted. Cardiology was consulted.  EKG without ischemic changes. CRP and ESR normal.  Ech unremarkable. No evidence to suggest pericarditis.  Would recommend conservative management for musculoskeletal chest pain.  Epigastric pain: Patient describes midsternal pain did not get better with pain  medication. Continue IV Protonix 40 mg every 12 hours GI is consulted.  Hyperlipidemia Continue Lipitor 40 mg daily  Nausea/ vomiting and diarrhea: CT A/P negative for obstruction possible likely viral gastroenteritis. Continue IV fluid resuscitation. Stool for C. difficile and GI panel negative. Continue as needed Zofran.  Hypokalemia / Hypomagnesemia: Replaced.  Continue to monitor  Diabetes mellitus: Last hemoglobin A1c 6.6 which is well-controlled metformin Ozempic and Tresiba at home. Hold p.o. diabetic medications Continue Semglee 30 units daily Continue regular insulin sliding scale  Obesity: Patient is taking phentermine but she stopped taking it 5 days ago due to chest pain. Diet and exercise discussed in detail.  DVT prophylaxis: Lovenox Code Status: Full code Family Communication: No family at bed side. Disposition Plan:  Status is: Observation The patient remains OBS appropriate and will d/c before 2 midnights.    Admitted for chest pain likely atypical due to musculoskeletal pain.  Echocardiogram within normal limits, cardiology signed off GI is consulted for epigastric pain.  Consultants:  Cardiology  Procedures:Echocardiogram  Antimicrobials:  Anti-infectives (From admission, onward)    None      Subjective: Patient was seen and examined at bedside.  Overnight events noted.   Patient continues to report midsternal chest pain.   She reports chest pain and shortness of breath with exertion. She states she not even able to walk without chest pain.   Objective: Vitals:   12/10/22 0008 12/10/22 0341 12/10/22 0500 12/10/22 0800  BP: (!) 140/79 121/87  126/84  Pulse: 80 86  79  Resp: 18   18  Temp: 99.3 F (37.4 C) 99.1 F (37.3 C)  98.8 F (37.1 C)  TempSrc:  Oral  Oral  SpO2: 100%  98%  100%  Weight:   102.8 kg   Height:        Intake/Output Summary (Last 24 hours) at 12/10/2022 1119 Last data filed at 12/09/2022 1630 Gross per 24 hour   Intake 600 ml  Output 4 ml  Net 596 ml   Filed Weights   12/08/22 0018 12/09/22 1328 12/10/22 0500  Weight: 98.9 kg 99 kg 102.8 kg    Examination:  General exam: Appears calm and comfortable, not in any acute distress. Respiratory system: CTA bilaterally, respiratory effort normal, RR 13. Chest wall: Tenderness noted,  Cardiovascular system: S1-S2 heard, regular rate and rhythm, no murmur. Gastrointestinal system: Abdomen is soft, non tender, non distended, BS+ Central nervous system: Alert and oriented x 3. No focal neurological deficits. Extremities: No edema, No cyanosis, No clubbing Skin: No rashes, lesions or ulcers Psychiatry: Judgement and insight appear normal. Mood & affect appropriate.     Data Reviewed: I have personally reviewed following labs and imaging studies  CBC: Recent Labs  Lab 12/08/22 0032 12/09/22 0349 12/10/22 0544  WBC 4.7 3.9* 4.2  HGB 12.5 11.0* 11.4*  HCT 39.3 33.8* 34.5*  MCV 89.9 90.6 88.9  PLT 304 255 236   Basic Metabolic Panel: Recent Labs  Lab 12/08/22 0032 12/08/22 0833 12/08/22 1011 12/09/22 0349 12/10/22 0544  NA 139  --   --  137 138  K 3.0*  --   --  3.2* 3.6  CL 100  --   --  105 104  CO2 26  --   --  25 25  GLUCOSE 278*  --   --  183* 237*  BUN 15  --   --  10 8  CREATININE 0.85  --   --  0.53 0.53  CALCIUM 9.2  --   --  8.2* 8.4*  MG  --  1.6*  --  2.0 1.9  PHOS  --   --  2.7  --  3.7   GFR: Estimated Creatinine Clearance: 95.4 mL/min (by C-G formula based on SCr of 0.53 mg/dL). Liver Function Tests: Recent Labs  Lab 12/08/22 0307  AST 21  ALT 20  ALKPHOS 76  BILITOT 0.5  PROT 7.9  ALBUMIN 3.9   Recent Labs  Lab 12/08/22 0032  LIPASE 23   No results for input(s): "AMMONIA" in the last 168 hours. Coagulation Profile: Recent Labs  Lab 12/08/22 0833  INR 1.2   Cardiac Enzymes: No results for input(s): "CKTOTAL", "CKMB", "CKMBINDEX", "TROPONINI" in the last 168 hours. BNP (last 3 results) No  results for input(s): "PROBNP" in the last 8760 hours. HbA1C: Recent Labs    12/08/22 0031  HGBA1C 8.0*   CBG: Recent Labs  Lab 12/09/22 0855 12/09/22 1348 12/09/22 1551 12/09/22 2110 12/10/22 0810  GLUCAP 224* 118* 168* 153* 176*   Lipid Profile: Recent Labs    12/09/22 0349  CHOL 101  HDL 41  LDLCALC 44  TRIG 80  CHOLHDL 2.5   Thyroid Function Tests: No results for input(s): "TSH", "T4TOTAL", "FREET4", "T3FREE", "THYROIDAB" in the last 72 hours. Anemia Panel: No results for input(s): "VITAMINB12", "FOLATE", "FERRITIN", "TIBC", "IRON", "RETICCTPCT" in the last 72 hours. Sepsis Labs: No results for input(s): "PROCALCITON", "LATICACIDVEN" in the last 168 hours.  Recent Results (from the past 240 hour(s))  Resp panel by RT-PCR (RSV, Flu A&B, Covid) Anterior Nasal Swab     Status: None   Collection Time: 12/08/22 12:32 AM   Specimen: Anterior Nasal Swab  Result Value  Ref Range Status   SARS Coronavirus 2 by RT PCR NEGATIVE NEGATIVE Final    Comment: (NOTE) SARS-CoV-2 target nucleic acids are NOT DETECTED.  The SARS-CoV-2 RNA is generally detectable in upper respiratory specimens during the acute phase of infection. The lowest concentration of SARS-CoV-2 viral copies this assay can detect is 138 copies/mL. A negative result does not preclude SARS-Cov-2 infection and should not be used as the sole basis for treatment or other patient management decisions. A negative result may occur with  improper specimen collection/handling, submission of specimen other than nasopharyngeal swab, presence of viral mutation(s) within the areas targeted by this assay, and inadequate number of viral copies(<138 copies/mL). A negative result must be combined with clinical observations, patient history, and epidemiological information. The expected result is Negative.  Fact Sheet for Patients:  BloggerCourse.com  Fact Sheet for Healthcare Providers:   SeriousBroker.it  This test is no t yet approved or cleared by the [place] FDA and  has been authorized for detection and/or diagnosis of SARS-CoV-2 by FDA under an Emergency Use Authorization (EUA). This EUA will remain  in effect (meaning this test can be used) for the duration of the COVID-19 declaration under Section 564(b)(1) of the Act, 21 U.S.C.section 360bbb-3(b)(1), unless the authorization is terminated  or revoked sooner.       Influenza A by PCR NEGATIVE NEGATIVE Final   Influenza B by PCR NEGATIVE NEGATIVE Final    Comment: (NOTE) The Xpert Xpress SARS-CoV-2/FLU/RSV plus assay is intended as an aid in the diagnosis of influenza from Nasopharyngeal swab specimens and should not be used as a sole basis for treatment. Nasal washings and aspirates are unacceptable for Xpert Xpress SARS-CoV-2/FLU/RSV testing.  Fact Sheet for Patients: BloggerCourse.com  Fact Sheet for Healthcare Providers: SeriousBroker.it  This test is not yet approved or cleared by the [place] FDA and has been authorized for detection and/or diagnosis of SARS-CoV-2 by FDA under an Emergency Use Authorization (EUA). This EUA will remain in effect (meaning this test can be used) for the duration of the COVID-19 declaration under Section 564(b)(1) of the Act, 21 U.S.C. section 360bbb-3(b)(1), unless the authorization is terminated or revoked.     Resp Syncytial Virus by PCR NEGATIVE NEGATIVE Final    Comment: (NOTE) Fact Sheet for Patients: BloggerCourse.com  Fact Sheet for Healthcare Providers: SeriousBroker.it  This test is not yet approved or cleared by the [place] FDA and has been authorized for detection and/or diagnosis of SARS-CoV-2 by FDA under an Emergency Use Authorization (EUA). This EUA will remain in effect (meaning this test can be used) for  the duration of the COVID-19 declaration under Section 564(b)(1) of the Act, 21 U.S.C. section 360bbb-3(b)(1), unless the authorization is terminated or revoked.  Performed at [facility], [address] [place], [place] [address]   C Difficile Quick Screen w PCR reflex     Status: None   Collection Time: 12/08/22  3:38 PM   Specimen: STOOL  Result Value Ref Range Status   C Diff antigen NEGATIVE NEGATIVE Final   C Diff toxin NEGATIVE NEGATIVE Final   C Diff interpretation No C. difficile detected.  Final    Comment: Performed at [facility], [address] [place], [place] [address]  Gastrointestinal Panel by PCR , Stool     Status: None   Collection Time: 12/08/22  3:38 PM   Specimen: STOOL  Result Value Ref Range Status   Campylobacter species NOT DETECTED NOT DETECTED Final  Plesimonas shigelloides NOT DETECTED NOT DETECTED Final   Salmonella species NOT DETECTED NOT DETECTED Final   Yersinia enterocolitica NOT DETECTED NOT DETECTED Final   Vibrio species NOT DETECTED NOT DETECTED Final   Vibrio cholerae NOT DETECTED NOT DETECTED Final   Enteroaggregative E coli (EAEC) NOT DETECTED NOT DETECTED Final   Enteropathogenic E coli (EPEC) NOT DETECTED NOT DETECTED Final   Enterotoxigenic E coli (ETEC) NOT DETECTED NOT DETECTED Final   Shiga like toxin producing E coli (STEC) NOT DETECTED NOT DETECTED Final   Shigella/Enteroinvasive E coli (EIEC) NOT DETECTED NOT DETECTED Final   Cryptosporidium NOT DETECTED NOT DETECTED Final   Cyclospora cayetanensis NOT DETECTED NOT DETECTED Final   Entamoeba histolytica NOT DETECTED NOT DETECTED Final   Giardia lamblia NOT DETECTED NOT DETECTED Final   Adenovirus F40/41 NOT DETECTED NOT DETECTED Final   Astrovirus NOT DETECTED NOT DETECTED Final   Norovirus GI/GII NOT DETECTED NOT DETECTED Final   Rotavirus A NOT DETECTED NOT DETECTED Final   Sapovirus (I, II, IV, and V) NOT DETECTED NOT DETECTED Final    Comment:  Performed at [facility], [address] [place], [place] [address]    Radiology Studies: ECHOCARDIOGRAM COMPLETE  Result Date: 12/09/2022    ECHOCARDIOGRAM REPORT   Patient Name:   NAKHIA LEVITAN [facility] Date of Exam: 12/09/2022 Medical Rec #:  [id number]      Height:       63.0 in Accession #:    [id number]     Weight:       218.0 lb Date of Birth:  [birth year]      BSA:          2.006 m Patient Age:    51 years       BP:           130/75 mmHg Patient Gender: F              HR:           74 bpm. Exam Location:  [facility] Procedure: 2D Echo Indications:     Chest Pain R07.9  History:         Patient has no prior history of Echocardiogram examinations.  Sonographer:     Overton Mam RDCS Referring Phys:  [address] [place] Diagnosing Phys: Chilton Si MD IMPRESSIONS  1. Left ventricular ejection fraction, by estimation, is 50 to 55%. The left ventricle has low normal function. The left ventricle has no regional wall motion abnormalities. There is mild left ventricular hypertrophy of the septal segment. Left ventricular diastolic parameters are consistent with Grade I diastolic dysfunction (impaired relaxation).  2. Right ventricular systolic function is normal. The right ventricular size is normal.  3. The mitral valve is normal in structure. Trivial mitral valve regurgitation. No evidence of mitral stenosis.  4. The aortic valve is tricuspid. Aortic valve regurgitation is not visualized. No aortic stenosis is present.  5. The inferior vena cava is normal in size with <50% respiratory variability, suggesting right atrial pressure of 8 mmHg. FINDINGS  Left Ventricle: Left ventricular ejection fraction, by estimation, is 50 to 55%. The left ventricle has low normal function. The left ventricle has no regional wall motion abnormalities. The left ventricular internal cavity size was normal in size. There is mild left ventricular hypertrophy of the septal segment. Left ventricular diastolic  parameters are consistent with Grade I diastolic dysfunction (impaired relaxation). Normal left ventricular filling pressure. Right Ventricle: The right ventricular size is normal. No  increase in right ventricular wall thickness. Right ventricular systolic function is normal. Left Atrium: Left atrial size was normal in size. Right Atrium: Right atrial size was normal in size. Pericardium: There is no evidence of pericardial effusion. Mitral Valve: The mitral valve is normal in structure. Trivial mitral valve regurgitation. No evidence of mitral valve stenosis. Tricuspid Valve: The tricuspid valve is normal in structure. Tricuspid valve regurgitation is not demonstrated. No evidence of tricuspid stenosis. Aortic Valve: The aortic valve is tricuspid. Aortic valve regurgitation is not visualized. No aortic stenosis is present. Aortic valve peak gradient measures 8.0 mmHg. Pulmonic Valve: The pulmonic valve was normal in structure. Pulmonic valve regurgitation is not visualized. No evidence of pulmonic stenosis. Aorta: The aortic root is normal in size and structure. Venous: The inferior vena cava is normal in size with less than 50% respiratory variability, suggesting right atrial pressure of 8 mmHg. IAS/Shunts: No atrial level shunt detected by color flow Doppler.  LEFT VENTRICLE PLAX 2D LVIDd:         4.70 cm     Diastology LVIDs:         3.20 cm     LV e' medial:    6.42 cm/s LV PW:         1.00 cm     LV E/e' medial:  9.6 LV IVS:        1.10 cm     LV e' lateral:   11.20 cm/s LVOT diam:     2.10 cm     LV E/e' lateral: 5.5 LV SV:         68 LV SV Index:   34 LVOT Area:     3.46 cm  LV Volumes (MOD) LV vol d, MOD A4C: 83.3 ml LV vol s, MOD A4C: 36.4 ml LV SV MOD A4C:     83.3 ml RIGHT VENTRICLE RV Basal diam:  2.90 cm RV S prime:     14.00 cm/s LEFT ATRIUM             Index        RIGHT ATRIUM           Index LA diam:        4.40 cm 2.19 cm/m   RA Area:     13.80 cm LA Vol (A2C):   44.3 ml 22.09 ml/m  RA Volume:    30.60 ml  15.26 ml/m LA Vol (A4C):   59.1 ml 29.46 ml/m LA Biplane Vol: 53.2 ml 26.52 ml/m  AORTIC VALVE                 PULMONIC VALVE AV Area (Vmax): 2.04 cm     PV Vmax:       1.28 m/s AV Vmax:        141.00 cm/s  PV Peak grad:  6.6 mmHg AV Peak Grad:   8.0 mmHg LVOT Vmax:      83.00 cm/s LVOT Vmean:     55.900 cm/s LVOT VTI:       0.195 m  AORTA Ao Root diam: 2.80 cm Ao Asc diam:  2.90 cm MITRAL VALVE MV Area (PHT): 3.66 cm    SHUNTS MV Decel Time: 207 msec    Systemic VTI:  0.20 m MV E velocity: 61.80 cm/s  Systemic Diam: 2.10 cm MV A velocity: 81.80 cm/s MV E/A ratio:  0.76 Chilton Si MD Electronically signed by Chilton Si MD Signature Date/Time: 12/09/2022/11:51:56 AM    Final  Scheduled Meds:  aspirin EC  81 mg Oral Daily   atorvastatin  40 mg Oral Daily   enoxaparin (LOVENOX) injection  0.5 mg/kg Subcutaneous Q24H   fluticasone  2 spray Each Nare Daily   insulin aspart  0-5 Units Subcutaneous QHS   insulin aspart  0-9 Units Subcutaneous TID WC   insulin glargine-yfgn  30 Units Subcutaneous QHS   loratadine  10 mg Oral Daily   pantoprazole (PROTONIX) IV  40 mg Intravenous Q12H   Continuous Infusions:     LOS: 0 days    Time spent: 35 mins    Willeen Niece, MD [facility]   If 7PM-7AM, please contact night-coverage

## 2022-12-11 DIAGNOSIS — R0789 Other chest pain: Secondary | ICD-10-CM | POA: Diagnosis not present

## 2022-12-11 LAB — GLUCOSE, CAPILLARY: Glucose-Capillary: 189 mg/dL — ABNORMAL HIGH (ref 70–99)

## 2022-12-11 MED ORDER — PANTOPRAZOLE SODIUM 40 MG PO TBEC: 40.0000 mg | DELAYED_RELEASE_TABLET | Freq: Every day | ORAL | 0 refills | Status: DC

## 2022-12-11 NOTE — Discharge Summary (Signed)
Physician Discharge Summary  Sharon Marquez ZOX:[id number] DOB: 06-23-1971 DOA: 12/08/2022  PCP: [facility]  Admit date: 12/08/2022  Discharge date: 12/11/2022  Admitted From: Home  Disposition:  Home  Recommendations for Outpatient Follow-up:  Follow up with PCP in 1-2 weeks Please obtain BMP/CBC in one week. Advised to take pantoprazole 40 mg daily for GERD.  Home Health:None Equipment/Devices:None  Discharge Condition: Stable CODE STATUS:Full code Diet recommendation: Heart Healthy   Brief Summary / Hospital Course: This 52 years old female with PMH significant for DM, anemia, obesity, vitamin B12 deficiency, fibroids(s/p hysterectomy) presented in the ED with complaints of chest pain associated with N/V/D. Patient reports she started with nausea,  vomiting and diarrhea that lasted 5 days associated with subjective fever and chills. She also reports having central chest pain. Patient describes chest pain as constant, pressure-like , Nonradiating associated with shortness of breath. Workup in the ED RSV, influenza, COVID-negative. UA unremarkable. Chest x-ray negative. CT abdomen pelvis is negative for SBO but showed mild enteritis. CTA chest was negative for PE but does show possible pulmonary hypertension. Patient was admitted for further evaluation and Cardiology was consulted. Cardiac workup was so far unremarkable.  Patient continued to have substernal chest pain.  Started on Protonix 40 mg IV every 12 hours.  Gastroenterology consulted,  Patient does not require any GI intervention at this time.  GI states patient can be discharged.  Patient feels much improved after Protonix was given.  Chest pain has improved and she wants to be discharged.  Discharge Diagnoses:  Principal Problem:   Atypical chest pain Active Problems:   Nausea vomiting and diarrhea   Diabetes mellitus without complication   Hypokalemia   Hypomagnesemia   HLD (hyperlipidemia)   Obesity  (BMI 30-39.9)   Enteritis  Chest pain, atypical: Patient presented with chest pain, Likely musculoskeletal  CTA chest negative for PE.  Chest x-ray unremarkable. Patient reports persistent pressure-like chest pain,  reproducible. Troponin x 2 negative. Continue adequate pain control with fentanyl and nitroglycerin Continue aspirin and Lipitor UDS > Opioids + 2D echo shows LVEF 50 to 55%, left ventricle has low normal function no RWMA.  No pericardial effusion noted. Cardiology was consulted.  EKG without ischemic changes. CRP and ESR normal.  Ech unremarkable. No evidence to suggest pericarditis.  Would recommend conservative management for musculoskeletal chest pain. Chest pain improved.   Epigastric pain: Patient describes midsternal pain did not get better with pain medication. Continue IV Protonix 40 mg every 12 hours GI is consulted.  No intervention recommended.  Patient feels better. Patient being discharged on Protonix 40 mg daily   Hyperlipidemia Continue Lipitor 40 mg daily   Nausea/ vomiting and diarrhea: CT A/P negative for obstruction possible likely viral gastroenteritis. Continue IV fluid resuscitation. Stool for C. difficile and GI panel negative. Continue as needed Zofran. N/V / Diarrhea improved.   Hypokalemia / Hypomagnesemia: Replaced.  Resolved.   Diabetes mellitus: Last hemoglobin A1c 6.6 which is well-controlled metformin Ozempic and Tresiba at home. Hold p.o. diabetic medications Continue Semglee 30 units daily Continue regular insulin sliding scale Resume home medications.   Obesity: Patient is taking phentermine but she stopped taking it 5 days ago due to chest pain. Diet and exercise discussed in detail.  Discharge Instructions  Discharge Instructions     Call MD for:  difficulty breathing, headache or visual disturbances   Complete by: As directed    Call MD for:  persistant dizziness or light-headedness  Complete by: As directed     Call MD for:  persistant nausea and vomiting   Complete by: As directed    Diet - low sodium heart healthy   Complete by: As directed    Diet Carb Modified   Complete by: As directed    Discharge instructions   Complete by: As directed    Advised to follow-up with primary care physician in 1 week. Advised to take pantoprazole 40 mg daily for GERD.   Increase activity slowly   Complete by: As directed       Allergies as of 12/11/2022       Reactions   Meloxicam Itching   Tramadol Itching, Swelling        Medication List     STOP taking these medications    phentermine 37.5 MG tablet Commonly known as: ADIPEX-P   predniSONE 10 MG tablet Commonly known as: DELTASONE       TAKE these medications    albuterol 108 (90 Base) MCG/ACT inhaler Commonly known as: VENTOLIN HFA Inhale 1-2 puffs into the lungs every 6 (six) hours as needed for wheezing or shortness of breath.   atorvastatin 40 MG tablet Commonly known as: LIPITOR Take 40 mg by mouth daily.   cetirizine 10 MG tablet Commonly known as: ZYRTEC TAKE 1 TABLET BY MOUTH EVERY DAY   guaiFENesin-codeine 100-10 MG/5ML syrup Take 5 mLs by mouth every 6 (six) hours as needed for cough.   metFORMIN 1000 MG tablet Commonly known as: Glucophage Take 1 tablet (1,000 mg total) by mouth 2 (two) times daily with a meal. What changed:  how much to take additional instructions   Ozempic (2 MG/DOSE) 8 MG/3ML Sopn Generic drug: Semaglutide (2 MG/DOSE) Inject 2 mg as directed once a week.   pantoprazole 40 MG tablet Commonly known as: Protonix Take 1 tablet (40 mg total) by mouth daily.   Evaristo Bury FlexTouch 100 UNIT/ML FlexTouch Pen Generic drug: insulin degludec Inject 40 Units into the skin at bedtime.        Follow-up Information     [facility]. Go in 1 week(s).   Specialty: General Practice Why: Appointment: April 24th at 8:20am. Thanks! Contact information: [address] [place] [address] [phone]                Allergies  Allergen Reactions   Meloxicam Itching   Tramadol Itching and Swelling    Consultations: Cardiology Gastroenterology   Procedures/Studies: ECHOCARDIOGRAM COMPLETE  Result Date: 12/09/2022    ECHOCARDIOGRAM REPORT   Patient Name:   JELISSA ESPIRITU [facility] Date of Exam: 12/09/2022 Medical Rec #:  [id number]      Height:       63.0 in Accession #:    [id number]     Weight:       218.0 lb Date of Birth:  04-Jul-1971      BSA:          2.006 m Patient Age:    51 years       BP:           130/75 mmHg Patient Gender: F              HR:           74 bpm. Exam Location:  [facility] Procedure: 2D Echo Indications:     Chest Pain R07.9  History:         Patient has no prior history of Echocardiogram examinations.  Sonographer:  Overton Mam RDCS Referring Phys:  [id number] RONALD BERGE Diagnosing Phys: Chilton Si MD IMPRESSIONS  1. Left ventricular ejection fraction, by estimation, is 50 to 55%. The left ventricle has low normal function. The left ventricle has no regional wall motion abnormalities. There is mild left ventricular hypertrophy of the septal segment. Left ventricular diastolic parameters are consistent with Grade I diastolic dysfunction (impaired relaxation).  2. Right ventricular systolic function is normal. The right ventricular size is normal.  3. The mitral valve is normal in structure. Trivial mitral valve regurgitation. No evidence of mitral stenosis.  4. The aortic valve is tricuspid. Aortic valve regurgitation is not visualized. No aortic stenosis is present.  5. The inferior vena cava is normal in size with <50% respiratory variability, suggesting right atrial pressure of 8 mmHg. FINDINGS  Left Ventricle: Left ventricular ejection fraction, by estimation, is 50 to 55%. The left ventricle has low normal function. The left ventricle has no regional wall motion abnormalities. The left ventricular internal cavity size  was normal in size. There is mild left ventricular hypertrophy of the septal segment. Left ventricular diastolic parameters are consistent with Grade I diastolic dysfunction (impaired relaxation). Normal left ventricular filling pressure. Right Ventricle: The right ventricular size is normal. No increase in right ventricular wall thickness. Right ventricular systolic function is normal. Left Atrium: Left atrial size was normal in size. Right Atrium: Right atrial size was normal in size. Pericardium: There is no evidence of pericardial effusion. Mitral Valve: The mitral valve is normal in structure. Trivial mitral valve regurgitation. No evidence of mitral valve stenosis. Tricuspid Valve: The tricuspid valve is normal in structure. Tricuspid valve regurgitation is not demonstrated. No evidence of tricuspid stenosis. Aortic Valve: The aortic valve is tricuspid. Aortic valve regurgitation is not visualized. No aortic stenosis is present. Aortic valve peak gradient measures 8.0 mmHg. Pulmonic Valve: The pulmonic valve was normal in structure. Pulmonic valve regurgitation is not visualized. No evidence of pulmonic stenosis. Aorta: The aortic root is normal in size and structure. Venous: The inferior vena cava is normal in size with less than 50% respiratory variability, suggesting right atrial pressure of 8 mmHg. IAS/Shunts: No atrial level shunt detected by color flow Doppler.  LEFT VENTRICLE PLAX 2D LVIDd:         4.70 cm     Diastology LVIDs:         3.20 cm     LV e' medial:    6.42 cm/s LV PW:         1.00 cm     LV E/e' medial:  9.6 LV IVS:        1.10 cm     LV e' lateral:   11.20 cm/s LVOT diam:     2.10 cm     LV E/e' lateral: 5.5 LV SV:         68 LV SV Index:   34 LVOT Area:     3.46 cm  LV Volumes (MOD) LV vol d, MOD A4C: 83.3 ml LV vol s, MOD A4C: 36.4 ml LV SV MOD A4C:     83.3 ml RIGHT VENTRICLE RV Basal diam:  2.90 cm RV S prime:     14.00 cm/s LEFT ATRIUM             Index        RIGHT ATRIUM            Index LA diam:        4.40 cm  2.19 cm/m   RA Area:     13.80 cm LA Vol (A2C):   44.3 ml 22.09 ml/m  RA Volume:   30.60 ml  15.26 ml/m LA Vol (A4C):   59.1 ml 29.46 ml/m LA Biplane Vol: 53.2 ml 26.52 ml/m  AORTIC VALVE                 PULMONIC VALVE AV Area (Vmax): 2.04 cm     PV Vmax:       1.28 m/s AV Vmax:        141.00 cm/s  PV Peak grad:  6.6 mmHg AV Peak Grad:   8.0 mmHg LVOT Vmax:      83.00 cm/s LVOT Vmean:     55.900 cm/s LVOT VTI:       0.195 m  AORTA Ao Root diam: 2.80 cm Ao Asc diam:  2.90 cm MITRAL VALVE MV Area (PHT): 3.66 cm    SHUNTS MV Decel Time: 207 msec    Systemic VTI:  0.20 m MV E velocity: 61.80 cm/s  Systemic Diam: 2.10 cm MV A velocity: 81.80 cm/s MV E/A ratio:  0.76 Chilton Si MD Electronically signed by Chilton Si MD Signature Date/Time: 12/09/2022/11:51:56 AM    Final    CT Angio Chest PE W/Cm &/Or Wo Cm  Result Date: 12/08/2022 CLINICAL DATA:  Chest pain. Elevated blood glucose. Fever and chills with nausea and vomiting for 3 days. EXAM: CT ANGIOGRAPHY CHEST CT ABDOMEN AND PELVIS WITH CONTRAST TECHNIQUE: Multidetector CT imaging of the chest was performed using the standard protocol during bolus administration of intravenous contrast. Multiplanar CT image reconstructions and MIPs were obtained to evaluate the vascular anatomy. Multidetector CT imaging of the abdomen and pelvis was performed using the standard protocol during bolus administration of intravenous contrast. RADIATION DOSE REDUCTION: This exam was performed according to the departmental dose-optimization program which includes automated exposure control, adjustment of the mA and/or kV according to patient size and/or use of iterative reconstruction technique. CONTRAST:  OMNIPAQUE IOHEXOL 350 MG/ML SOLN COMPARISON:  None Available. FINDINGS: CTA CHEST FINDINGS Cardiovascular: Satisfactory opacification of the pulmonary arteries to the segmental level. No evidence of pulmonary embolism. Increase  caliber of the main pulmonary artery compatible with PA hypertension. The heart size appears within normal limits. No pericardial effusion. Normal heart size. No pericardial effusion. Mediastinum/Nodes: No enlarged mediastinal, hilar, or axillary lymph nodes. Thyroid gland, trachea, and esophagus demonstrate no significant findings. Lungs/Pleura: Central airways appear patent. There is no pleural fluid or airspace disease. No signs of pneumothorax. No suspicious pulmonary nodule or mass. Musculoskeletal: No chest wall abnormality. No acute or significant osseous findings. Degenerative changes noted within the thoracic spine. Review of the MIP images confirms the above findings. CT ABDOMEN and PELVIS FINDINGS Hepatobiliary: No suspicious liver lesion. Status post cholecystectomy. Upper limits of normal common bile duct measures 6 mm. No intrahepatic bile duct dilatation. Pancreas: Unremarkable. No pancreatic ductal dilatation or surrounding inflammatory changes. Spleen: Normal in size without focal abnormality. Adrenals/Urinary Tract: Adrenal glands are unremarkable. Kidneys are normal, without renal calculi, focal lesion, or hydronephrosis. Bladder is unremarkable. Stomach/Bowel: Small hiatal hernia. Stomach otherwise normal. The appendix is visualized and appears within normal limits. The proximal small bowel loops appear normal. There are scattered mildly dilated loops of small bowel with air-fluid levels with intervening segments of normal caliber small bowel within the abdomen and pelvis. No significant bowel wall thickening or inflammation identified. The appendix is visualized and is within normal limits. Vascular/Lymphatic:  Normal appearance of the abdominal aorta. The upper abdominal vascularity is patent. No signs of abdominopelvic adenopathy. Reproductive: Status post hysterectomy. No adnexal masses. Other: No free fluid or fluid collections. No signs of pneumoperitoneum. No abdominal wall hernia  identified. Musculoskeletal: No acute or significant osseous findings. Review of the MIP images confirms the above findings. IMPRESSION: 1. No evidence for acute pulmonary embolism. 2. 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 3. Multiple mildly dilated loops of small bowel with air-fluid levels, concerning for mild enteritis. No evidence for bowel obstruction. 4. Small hiatal hernia. Electronically Signed   By: Signa Kell M.D.   On: 12/08/2022 07:27   CT ABDOMEN PELVIS W CONTRAST  Result Date: 12/08/2022 CLINICAL DATA:  Chest pain. Elevated blood glucose. Fever and chills with nausea and vomiting for 3 days. EXAM: CT ANGIOGRAPHY CHEST CT ABDOMEN AND PELVIS WITH CONTRAST TECHNIQUE: Multidetector CT imaging of the chest was performed using the standard protocol during bolus administration of intravenous contrast. Multiplanar CT image reconstructions and MIPs were obtained to evaluate the vascular anatomy. Multidetector CT imaging of the abdomen and pelvis was performed using the standard protocol during bolus administration of intravenous contrast. RADIATION DOSE REDUCTION: This exam was performed according to the departmental dose-optimization program which includes automated exposure control, adjustment of the mA and/or kV according to patient size and/or use of iterative reconstruction technique. CONTRAST:  OMNIPAQUE IOHEXOL 350 MG/ML SOLN COMPARISON:  None Available. FINDINGS: CTA CHEST FINDINGS Cardiovascular: Satisfactory opacification of the pulmonary arteries to the segmental level. No evidence of pulmonary embolism. 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 The heart size appears within normal limits. No pericardial effusion. Normal heart size. No pericardial effusion. Mediastinum/Nodes: No enlarged mediastinal, hilar, or axillary lymph nodes. Thyroid gland, trachea, and esophagus demonstrate no significant findings.  Lungs/Pleura: Central airways appear patent. There is no pleural fluid or airspace disease. No signs of pneumothorax. No suspicious pulmonary nodule or mass. Musculoskeletal: No chest wall abnormality. No acute or significant osseous findings. Degenerative changes noted within the thoracic spine. Review of the MIP images confirms the above findings. CT ABDOMEN and PELVIS FINDINGS Hepatobiliary: No suspicious liver lesion. Status post cholecystectomy. Upper limits of normal common bile duct measures 6 mm. No intrahepatic bile duct dilatation. Pancreas: Unremarkable. No pancreatic ductal dilatation or surrounding inflammatory changes. Spleen: Normal in size without focal abnormality. Adrenals/Urinary Tract: Adrenal glands are unremarkable. Kidneys are normal, without renal calculi, focal lesion, or hydronephrosis. Bladder is unremarkable. Stomach/Bowel: Small hiatal hernia. Stomach otherwise normal. The appendix is visualized and appears within normal limits. The proximal small bowel loops appear normal. There are scattered mildly dilated loops of small bowel with air-fluid levels with intervening segments of normal caliber small bowel within the abdomen and pelvis. No significant bowel wall thickening or inflammation identified. The appendix is visualized and is within normal limits. Vascular/Lymphatic: Normal appearance of the abdominal aorta. The upper abdominal vascularity is patent. No signs of abdominopelvic adenopathy. Reproductive: Status post hysterectomy. No adnexal masses. Other: No free fluid or fluid collections. No signs of pneumoperitoneum. No abdominal wall hernia identified. Musculoskeletal: No acute or significant osseous findings. Review of the MIP images confirms the above findings. IMPRESSION: 1. No evidence for acute pulmonary embolism. 2. 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 3. Multiple mildly dilated loops of small bowel with air-fluid levels,  concerning for mild enteritis. No evidence for bowel obstruction. 4. Small hiatal hernia. Electronically  Signed   By: Signa Kell M.D.   On: 12/08/2022 07:27   DG Chest 2 View  Result Date: 12/08/2022 CLINICAL DATA:  Fever and chills with nausea and vomiting for 3 days. Chest pain as well. EXAM: CHEST - 2 VIEW COMPARISON:  Radiographs 04/20/2022 FINDINGS: The heart size and mediastinal contours are within normal limits. Both lungs are clear. The visualized skeletal structures are unremarkable. IMPRESSION: No active cardiopulmonary disease. Electronically Signed   By: Minerva Fester M.D.   On: 12/08/2022 01:05     Subjective: Patient was seen and examined at bedside.  Overnight events noted.   Patient reports doing much better,  chest pain has improved.  She wants to be discharged home.  Discharge Exam: Vitals:   12/11/22 0408 12/11/22 0753  BP: 104/70 104/79  Pulse: 80 83  Resp: 19 14  Temp: 98 F (36.7 C) (!) 97.3 F (36.3 C)  SpO2: 100% 100%   Vitals:   12/10/22 2002 12/10/22 2300 12/11/22 0408 12/11/22 0753  BP: 130/88 127/73 104/70 104/79  Pulse: 81 76 80 83  Resp: Temp: 97.6 F (36.4 C) 98.5 F (36.9 C) 98 F (36.7 C) (!) 97.3 F (36.3 C)  TempSrc: Oral Oral Oral Oral  SpO2: 100% 100% 100% 100%  Weight:      Height:        General: Pt is alert, awake, not in acute distress Cardiovascular: RRR, S1/S2 +, no rubs, no gallops Respiratory: CTA bilaterally, no wheezing, no rhonchi Abdominal: Soft, NT, ND, bowel sounds + Extremities: no edema, no cyanosis    The results of significant diagnostics from this hospitalization (including imaging, microbiology, ancillary and laboratory) are listed below for reference.     Microbiology: Recent Results (from the past 240 hour(s))  Resp panel by RT-PCR (RSV, Flu A&B, Covid) Anterior Nasal Swab     Status: None   Collection Time: 12/08/22 12:32 AM   Specimen: Anterior Nasal Swab  Result Value Ref Range  Status   SARS Coronavirus 2 by RT PCR NEGATIVE NEGATIVE Final    Comment: (NOTE) SARS-CoV-2 target nucleic acids are NOT DETECTED.  The SARS-CoV-2 RNA is generally detectable in upper respiratory specimens during the acute phase of infection. The lowest concentration of SARS-CoV-2 viral copies this assay can detect is 138 copies/mL. A negative result does not preclude SARS-Cov-2 infection and should not be used as the sole basis for treatment or other patient management decisions. A negative result may occur with  improper specimen collection/handling, submission of specimen other than nasopharyngeal swab, presence of viral mutation(s) within the areas targeted by this assay, and inadequate number of viral copies(<138 copies/mL). A negative result must be combined with clinical observations, patient history, and epidemiological information. The expected result is Negative.  Fact Sheet for Patients:  BloggerCourse.com  Fact Sheet for Healthcare Providers:  SeriousBroker.it  This test is no t yet approved or cleared by the [place] FDA and  has been authorized for detection and/or diagnosis of SARS-CoV-2 by FDA under an Emergency Use Authorization (EUA). This EUA will remain  in effect (meaning this test can be used) for the duration of the COVID-19 declaration under Section 564(b)(1) of the Act, 21 U.S.C.section 360bbb-3(b)(1), unless the authorization is terminated  or revoked sooner.       Influenza A by PCR NEGATIVE NEGATIVE Final   Influenza B by PCR NEGATIVE NEGATIVE Final    Comment: (NOTE) The Xpert Xpress SARS-CoV-2/FLU/RSV plus assay is intended  as an aid in the diagnosis of influenza from Nasopharyngeal swab specimens and should not be used as a sole basis for treatment. Nasal washings and aspirates are unacceptable for Xpert Xpress SARS-CoV-2/FLU/RSV testing.  Fact Sheet for  Patients: BloggerCourse.com  Fact Sheet for Healthcare Providers: SeriousBroker.it  This test is not yet approved or cleared by the [place] FDA and has been authorized for detection and/or diagnosis of SARS-CoV-2 by FDA under an Emergency Use Authorization (EUA). This EUA will remain in effect (meaning this test can be used) for the duration of the COVID-19 declaration under Section 564(b)(1) of the Act, 21 U.S.C. section 360bbb-3(b)(1), unless the authorization is terminated or revoked.     Resp Syncytial Virus by PCR NEGATIVE NEGATIVE Final    Comment: (NOTE) Fact Sheet for Patients: BloggerCourse.com  Fact Sheet for Healthcare Providers: SeriousBroker.it  This test is not yet approved or cleared by the [place] FDA and has been authorized for detection and/or diagnosis of SARS-CoV-2 by FDA under an Emergency Use Authorization (EUA). This EUA will remain in effect (meaning this test can be used) for the duration of the COVID-19 declaration under Section 564(b)(1) of the Act, 21 U.S.C. section 360bbb-3(b)(1), unless the authorization is terminated or revoked.  Performed at [facility], [address] [place], [place] [address]   C Difficile Quick Screen w PCR reflex     Status: None   Collection Time: 12/08/22  3:38 PM   Specimen: STOOL  Result Value Ref Range Status   C Diff antigen NEGATIVE NEGATIVE Final   C Diff toxin NEGATIVE NEGATIVE Final   C Diff interpretation No C. difficile detected.  Final    Comment: Performed at [facility], [address] [place], [place] [address]  Gastrointestinal Panel by PCR , Stool     Status: None   Collection Time: 12/08/22  3:38 PM   Specimen: STOOL  Result Value Ref Range Status   Campylobacter species NOT DETECTED NOT DETECTED Final   Plesimonas shigelloides NOT DETECTED NOT DETECTED Final    Salmonella species NOT DETECTED NOT DETECTED Final   Yersinia enterocolitica NOT DETECTED NOT DETECTED Final   Vibrio species NOT DETECTED NOT DETECTED Final   Vibrio cholerae NOT DETECTED NOT DETECTED Final   Enteroaggregative E coli (EAEC) NOT DETECTED NOT DETECTED Final   Enteropathogenic E coli (EPEC) NOT DETECTED NOT DETECTED Final   Enterotoxigenic E coli (ETEC) NOT DETECTED NOT DETECTED Final   Shiga like toxin producing E coli (STEC) NOT DETECTED NOT DETECTED Final   Shigella/Enteroinvasive E coli (EIEC) NOT DETECTED NOT DETECTED Final   Cryptosporidium NOT DETECTED NOT DETECTED Final   Cyclospora cayetanensis NOT DETECTED NOT DETECTED Final   Entamoeba histolytica NOT DETECTED NOT DETECTED Final   Giardia lamblia NOT DETECTED NOT DETECTED Final   Adenovirus F40/41 NOT DETECTED NOT DETECTED Final   Astrovirus NOT DETECTED NOT DETECTED Final   Norovirus GI/GII NOT DETECTED NOT DETECTED Final   Rotavirus A NOT DETECTED NOT DETECTED Final   Sapovirus (I, II, IV, and V) NOT DETECTED NOT DETECTED Final    Comment: Performed at [facility], [address] [place], [place] [id number]     Labs: BNP (last 3 results) Recent Labs    12/09/22 0349  BNP 9.9   Basic Metabolic Panel: Recent Labs  Lab 12/08/22 0032 12/08/22 0833 12/08/22 1011 12/09/22 0349 12/10/22 0544  NA 139  --   --  137 138  K 3.0*  --   --  3.2* 3.6  CL 100  --   --  105 104  CO2 26  --   --  25 25  GLUCOSE 278*  --   --  183* 237*  BUN 15  --   --  10 8  CREATININE 0.85  --   --  0.53 0.53  CALCIUM 9.2  --   --  8.2* 8.4*  MG  --  1.6*  --  2.0 1.9  PHOS  --   --  2.7  --  3.7   Liver Function Tests: Recent Labs  Lab 12/08/22 0307  AST 21  ALT 20  ALKPHOS 76  BILITOT 0.5  PROT 7.9  ALBUMIN 3.9   Recent Labs  Lab 12/08/22 0032  LIPASE 23   No results for input(s): "AMMONIA" in the last 168 hours. CBC: Recent Labs  Lab 12/08/22 0032 12/09/22 0349 12/10/22 0544  WBC 4.7  3.9* 4.2  HGB 12.5 11.0* 11.4*  HCT 39.3 33.8* 34.5*  MCV 89.9 90.6 88.9  PLT 304 255 236   Cardiac Enzymes: No results for input(s): "CKTOTAL", "CKMB", "CKMBINDEX", "TROPONINI" in the last 168 hours. BNP: Invalid input(s): "POCBNP" CBG: Recent Labs  Lab 12/10/22 0810 12/10/22 1157 12/10/22 1552 12/10/22 2000 12/11/22 0754  GLUCAP 176* 228* 151* 217* 189*   D-Dimer No results for input(s): "DDIMER" in the last 72 hours. Hgb A1c No results for input(s): "HGBA1C" in the last 72 hours. Lipid Profile Recent Labs    12/09/22 0349  CHOL 101  HDL 41  LDLCALC 44  TRIG 80  CHOLHDL 2.5   Thyroid function studies No results for input(s): "TSH", "T4TOTAL", "T3FREE", "THYROIDAB" in the last 72 hours.  Invalid input(s): "FREET3" Anemia work up No results for input(s): "VITAMINB12", "FOLATE", "FERRITIN", "TIBC", "IRON", "RETICCTPCT" in the last 72 hours. Urinalysis    Component Value Date/Time   COLORURINE YELLOW (A) 12/08/2022 0638   COLORURINE YELLOW (A) 12/08/2022 0638   APPEARANCEUR CLOUDY (A) 12/08/2022 0638   APPEARANCEUR CLOUDY (A) 12/08/2022 0638   LABSPEC 1.021 12/08/2022 0638   LABSPEC 1.023 12/08/2022 0638   PHURINE 5.0 12/08/2022 0638   PHURINE 5.0 12/08/2022 0638   GLUCOSEU NEGATIVE 12/08/2022 0638   GLUCOSEU NEGATIVE 12/08/2022 0638   HGBUR NEGATIVE 12/08/2022 0638   HGBUR NEGATIVE 12/08/2022 0638   BILIRUBINUR NEGATIVE 12/08/2022 0638   BILIRUBINUR NEGATIVE 12/08/2022 0638   KETONESUR NEGATIVE 12/08/2022 0638   KETONESUR NEGATIVE 12/08/2022 0638   PROTEINUR 100 (A) 12/08/2022 0638   PROTEINUR 100 (A) 12/08/2022 0638   NITRITE NEGATIVE 12/08/2022 0638   NITRITE NEGATIVE 12/08/2022 0638   LEUKOCYTESUR SMALL (A) 12/08/2022 0638   LEUKOCYTESUR SMALL (A) 12/08/2022 0638   Sepsis Labs Recent Labs  Lab 12/08/22 0032 12/09/22 0349 12/10/22 0544  WBC 4.7 3.9* 4.2   Microbiology Recent Results (from the past 240 hour(s))  Resp panel by RT-PCR (RSV,  Flu A&B, Covid) Anterior Nasal Swab     Status: None   Collection Time: 12/08/22 12:32 AM   Specimen: Anterior Nasal Swab  Result Value Ref Range Status   SARS Coronavirus 2 by RT PCR NEGATIVE NEGATIVE Final    Comment: (NOTE) SARS-CoV-2 target nucleic acids are NOT DETECTED.  The SARS-CoV-2 RNA is generally detectable in upper respiratory specimens during the acute phase of infection. The lowest concentration of SARS-CoV-2 viral copies this assay can detect is 138 copies/mL. A negative result does not preclude SARS-Cov-2 infection and should not be used as the sole basis for treatment  or other patient management decisions. A negative result may occur with  improper specimen collection/handling, submission of specimen other than nasopharyngeal swab, presence of viral mutation(s) within the areas targeted by this assay, and inadequate number of viral copies(<138 copies/mL). A negative result must be combined with clinical observations, patient history, and epidemiological information. The expected result is Negative.  Fact Sheet for Patients:  BloggerCourse.com  Fact Sheet for Healthcare Providers:  SeriousBroker.it  This test is no t yet approved or cleared by the [place] FDA and  has been authorized for detection and/or diagnosis of SARS-CoV-2 by FDA under an Emergency Use Authorization (EUA). This EUA will remain  in effect (meaning this test can be used) for the duration of the COVID-19 declaration under Section 564(b)(1) of the Act, 21 U.S.C.section 360bbb-3(b)(1), unless the authorization is terminated  or revoked sooner.       Influenza A by PCR NEGATIVE NEGATIVE Final   Influenza B by PCR NEGATIVE NEGATIVE Final    Comment: (NOTE) The Xpert Xpress SARS-CoV-2/FLU/RSV plus assay is intended as an aid in the diagnosis of influenza from Nasopharyngeal swab specimens and should not be used as a sole basis for  treatment. Nasal washings and aspirates are unacceptable for Xpert Xpress SARS-CoV-2/FLU/RSV testing.  Fact Sheet for Patients: BloggerCourse.com  Fact Sheet for Healthcare Providers: SeriousBroker.it  This test is not yet approved or cleared by the [place] FDA and has been authorized for detection and/or diagnosis of SARS-CoV-2 by FDA under an Emergency Use Authorization (EUA). This EUA will remain in effect (meaning this test can be used) for the duration of the COVID-19 declaration under Section 564(b)(1) of the Act, 21 U.S.C. section 360bbb-3(b)(1), unless the authorization is terminated or revoked.     Resp Syncytial Virus by PCR NEGATIVE NEGATIVE Final    Comment: (NOTE) Fact Sheet for Patients: BloggerCourse.com  Fact Sheet for Healthcare Providers: SeriousBroker.it  This test is not yet approved or cleared by the [place] FDA and has been authorized for detection and/or diagnosis of SARS-CoV-2 by FDA under an Emergency Use Authorization (EUA). This EUA will remain in effect (meaning this test can be used) for the duration of the COVID-19 declaration under Section 564(b)(1) of the Act, 21 U.S.C. section 360bbb-3(b)(1), unless the authorization is terminated or revoked.  Performed at [facility], [address] [place], [place] [address]   C Difficile Quick Screen w PCR reflex     Status: None   Collection Time: 12/08/22  3:38 PM   Specimen: STOOL  Result Value Ref Range Status   C Diff antigen NEGATIVE NEGATIVE Final   C Diff toxin NEGATIVE NEGATIVE Final   C Diff interpretation No C. difficile detected.  Final    Comment: Performed at [facility], [address] [place], [place] [address]  Gastrointestinal Panel by PCR , Stool     Status: None   Collection Time: 12/08/22  3:38 PM   Specimen: STOOL  Result Value Ref Range Status    Campylobacter species NOT DETECTED NOT DETECTED Final   Plesimonas shigelloides NOT DETECTED NOT DETECTED Final   Salmonella species NOT DETECTED NOT DETECTED Final   Yersinia enterocolitica NOT DETECTED NOT DETECTED Final   Vibrio species NOT DETECTED NOT DETECTED Final   Vibrio cholerae NOT DETECTED NOT DETECTED Final   Enteroaggregative E coli (EAEC) NOT DETECTED NOT DETECTED Final   Enteropathogenic E coli (EPEC) NOT DETECTED NOT DETECTED Final   Enterotoxigenic E coli (ETEC) NOT DETECTED NOT  DETECTED Final   Shiga like toxin producing E coli (STEC) NOT DETECTED NOT DETECTED Final   Shigella/Enteroinvasive E coli (EIEC) NOT DETECTED NOT DETECTED Final   Cryptosporidium NOT DETECTED NOT DETECTED Final   Cyclospora cayetanensis NOT DETECTED NOT DETECTED Final   Entamoeba histolytica NOT DETECTED NOT DETECTED Final   Giardia lamblia NOT DETECTED NOT DETECTED Final   Adenovirus F40/41 NOT DETECTED NOT DETECTED Final   Astrovirus NOT DETECTED NOT DETECTED Final   Norovirus GI/GII NOT DETECTED NOT DETECTED Final   Rotavirus A NOT DETECTED NOT DETECTED Final   Sapovirus (I, II, IV, and V) NOT DETECTED NOT DETECTED Final    Comment: Performed at [facility], [address], [place], [place] [address]     Time coordinating discharge: Over 30 minutes  SIGNED:   Willeen Niece, MD  [facility] 12/11/2022, 11:11 AM Pager   If 7PM-7AM, please contact night-coverage

## 2022-12-11 NOTE — Discharge Instructions (Signed)
Advised to follow-up with primary care physician in 1 week. Advised to take pantoprazole 40 mg daily for GERD.

## 2022-12-11 NOTE — TOC Progression Note (Signed)
Transition of Care [facility]) - Progression Note    Patient Details  Name: SAMARRA RIDGELY MRN: [id number] Date of Birth: 1971/05/17  Transition of Care [facility]) CM/SW Contact  Truddie Hidden, RN Phone Number: 12/11/2022, 10:14 AM  Clinical Narrative:     Transition of Care [facility]) Screening Note   Patient Details  Name: KATIELYNN HORAN Date of Birth: 1971/08/25   Transition of Care [facility]) CM/SW Contact:    Truddie Hidden, RN Phone Number: 12/11/2022, 10:14 AM    Transition of Care Department [facility]) has reviewed patient and no TOC needs have been identified at this time. We will continue to monitor patient advancement through interdisciplinary progression rounds. If new patient transition needs arise, please place a TOC consult.           Expected Discharge Plan and Services         Expected Discharge Date: 12/11/22                                     Social Determinants of Health (SDOH) Interventions SDOH Screenings   Food Insecurity: No Food Insecurity (12/09/2022)  Housing: Low Risk  (12/09/2022)  Transportation Needs: No Transportation Needs (12/09/2022)  Utilities: Not At Risk (12/09/2022)  Tobacco Use: Low Risk  (12/08/2022)    Readmission Risk Interventions     No data to display

## 2022-12-11 NOTE — Plan of Care (Signed)

## 2022-12-11 NOTE — Plan of Care (Signed)
Problem: Education: Goal: Ability to describe self-care measures that may prevent or decrease complications (Diabetes Survival Skills Education) will improve 12/11/2022 1038 by Genia Hotter, RN Outcome: Adequate for Discharge 12/11/2022 0734 by Genia Hotter, RN Outcome: Progressing Goal: Individualized Educational Video(s) 12/11/2022 1038 by Genia Hotter, RN Outcome: Adequate for Discharge 12/11/2022 0734 by Genia Hotter, RN Outcome: Progressing   Problem: Coping: Goal: Ability to adjust to condition or change in health will improve 12/11/2022 1038 by Genia Hotter, RN Outcome: Adequate for Discharge 12/11/2022 0734 by Genia Hotter, RN Outcome: Progressing   Problem: Fluid Volume: Goal: Ability to maintain a balanced intake and output will improve 12/11/2022 1038 by Genia Hotter, RN Outcome: Adequate for Discharge 12/11/2022 0734 by Genia Hotter, RN Outcome: Progressing   Problem: Health Behavior/Discharge Planning: Goal: Ability to identify and utilize available resources and services will improve 12/11/2022 1038 by Genia Hotter, RN Outcome: Adequate for Discharge 12/11/2022 0734 by Genia Hotter, RN Outcome: Progressing Goal: Ability to manage health-related needs will improve 12/11/2022 1038 by Genia Hotter, RN Outcome: Adequate for Discharge 12/11/2022 0734 by Genia Hotter, RN Outcome: Progressing   Problem: Metabolic: Goal: Ability to maintain appropriate glucose levels will improve 12/11/2022 1038 by Genia Hotter, RN Outcome: Adequate for Discharge 12/11/2022 0734 by Genia Hotter, RN Outcome: Progressing   Problem: Nutritional: Goal: Maintenance of adequate nutrition will improve 12/11/2022 1038 by Genia Hotter, RN Outcome: Adequate for Discharge 12/11/2022 0734 by Genia Hotter, RN Outcome: Progressing Goal: Progress toward achieving an optimal weight will improve 12/11/2022 1038 by Genia Hotter, RN Outcome: Adequate for Discharge 12/11/2022 0734 by Genia Hotter, RN Outcome: Progressing   Problem: Skin Integrity: Goal: Risk for impaired skin integrity will decrease 12/11/2022 1038 by Genia Hotter, RN Outcome: Adequate for Discharge 12/11/2022 0734 by Genia Hotter, RN Outcome: Progressing   Problem: Tissue Perfusion: Goal: Adequacy of tissue perfusion will improve 12/11/2022 1038 by Genia Hotter, RN Outcome: Adequate for Discharge 12/11/2022 0734 by Genia Hotter, RN Outcome: Progressing   Problem: Education: Goal: Knowledge of General Education information will improve Description: Including pain rating scale, medication(s)/side effects and non-pharmacologic comfort measures 12/11/2022 1038 by Genia Hotter, RN Outcome: Adequate for Discharge 12/11/2022 0734 by Genia Hotter, RN Outcome: Progressing   Problem: Health Behavior/Discharge Planning: Goal: Ability to manage health-related needs will improve 12/11/2022 1038 by Genia Hotter, RN Outcome: Adequate for Discharge 12/11/2022 0734 by Genia Hotter, RN Outcome: Progressing   Problem: Clinical Measurements: Goal: Ability to maintain clinical measurements within normal limits will improve 12/11/2022 1038 by Genia Hotter, RN Outcome: Adequate for Discharge 12/11/2022 0734 by Genia Hotter, RN Outcome: Progressing Goal: Will remain free from infection 12/11/2022 1038 by Genia Hotter, RN Outcome: Adequate for Discharge 12/11/2022 0734 by Genia Hotter, RN Outcome: Progressing Goal: Diagnostic test results will improve 12/11/2022 1038 by Genia Hotter, RN Outcome: Adequate for Discharge 12/11/2022 0734 by Genia Hotter, RN Outcome: Progressing Goal: Respiratory complications will improve 12/11/2022 1038 by Genia Hotter, RN Outcome: Adequate for Discharge 12/11/2022 0734 by Genia Hotter, RN Outcome: Progressing Goal: Cardiovascular complication will be avoided 12/11/2022 1038 by Genia Hotter, RN Outcome: Adequate for Discharge 12/11/2022 0734 by Genia Hotter, RN Outcome:  Progressing   Problem: Activity: Goal: Risk for activity intolerance will decrease 12/11/2022 1038 by Genia Hotter, RN Outcome: Adequate for Discharge 12/11/2022 0734 by Genia Hotter, RN Outcome: Progressing   Problem: Nutrition: Goal: Adequate nutrition will be maintained 12/11/2022 1038 by Genia Hotter, RN Outcome: Adequate for Discharge 12/11/2022 0734 by Genia Hotter, RN Outcome: Progressing  Problem: Coping: Goal: Level of anxiety will decrease 12/11/2022 1038 by Genia Hotter, RN Outcome: Adequate for Discharge 12/11/2022 0734 by Genia Hotter, RN Outcome: Progressing   Problem: Elimination: Goal: Will not experience complications related to bowel motility 12/11/2022 1038 by Genia Hotter, RN Outcome: Adequate for Discharge 12/11/2022 0734 by Genia Hotter, RN Outcome: Progressing Goal: Will not experience complications related to urinary retention 12/11/2022 1038 by Genia Hotter, RN Outcome: Adequate for Discharge 12/11/2022 0734 by Genia Hotter, RN Outcome: Progressing   Problem: Pain Managment: Goal: General experience of comfort will improve 12/11/2022 1038 by Genia Hotter, RN Outcome: Adequate for Discharge 12/11/2022 0734 by Genia Hotter, RN Outcome: Progressing   Problem: Safety: Goal: Ability to remain free from injury will improve 12/11/2022 1038 by Genia Hotter, RN Outcome: Adequate for Discharge 12/11/2022 0734 by Genia Hotter, RN Outcome: Progressing   Problem: Skin Integrity: Goal: Risk for impaired skin integrity will decrease 12/11/2022 1038 by Genia Hotter, RN Outcome: Adequate for Discharge 12/11/2022 0734 by Genia Hotter, RN Outcome: Progressing

## 2022-12-26 NOTE — Progress Notes (Signed)
Cardiology Office Note:    Date:  12/27/2022   ID:  KHRIS REIL, DOB 05/12/1971, MRN [id number]  PCP:  Center, [facility] Providers Cardiologist:  Seen by Dr. Duke Salvia during recent admission, follows in [facility]   Referring MD: Center, [facility]*   Chief Complaint  Patient presents with   Chest Pain   Shortness of Breath    History of Present Illness:    Sharon Marquez is a 52 y.o. female with a hx of DM, obesity, normocytic anemia, B12 deficiency, uterine fibroids.   Patient was admitted from 4/13-4/16/23 for treatment of atypical chest pain. She presented to the ED on 4/13 complaining of chest pain associated with nausea, vomiting, diarrhea, fever, chills. Cardiology was consulted for evaluation of chest pain. Patient reported that her chest pain was worse with palpation, upper body movements, and deep breathing. Pain was better when sitting up and worse when lying down. CTA chest was w/o PE. Troponins negative x3. There was some concern for pericarditis, but ESR/CRP was within normal limits. Echocardiogram 4/14 showed EF 50-55%, no regional wall motion abnormalities, mild LVH, grade 1 DD, normal RV systolic function. Patient was instructed to follow up with cardiology as an outpatient, and if she continued to have exertional dyspnea, we could consider nuclear stress test vs coronary CTA.   Patient reports that since getting discharged from the hospital, she has continued to have occasional episodes of chest pain. Chest pain is not associated with exertion, and seems to occur randomly. Located in the middle of her chest, feels sharp. She cannot identify any triggers. Patient also complains of ongoing shortness of breath on exertion. She works at a [employer], and has to be on her feet a lot during the day. She has had to take more breaks due to shortness of breath in the past month. Denies orthopnea, ankle edema, syncope. Has a fluttering  feeling in her chest about once per week. Fluttering feeling is brief and does not bother her very much. Since leaving the hospital, she has had two episodes where it "Feels like her mind goes blank" but denies dizziness, syncope. Her grandmother and mother both had coronary artery disease.     Past Medical History:  Diagnosis Date   Diabetes mellitus without complication (HCC)    Family history of adverse reaction to anesthesia    "mother has difficulty waking up after surgery ."   Fibroid uterus 2014   History of blood transfusion 2014, 2015   for symptomatic anemia   Normocytic anemia    Synovitis of wrist    left   Vitamin B deficiency     Past Surgical History:  Procedure Laterality Date   ABDOMINAL HYSTERECTOMY     CHOLECYSTECTOMY     LAPAROSCOPIC VAGINAL HYSTERECTOMY WITH SALPINGECTOMY Bilateral 07/15/2017   Procedure: LAPAROSCOPIC ASSISTED VAGINAL HYSTERECTOMY WITH BILATERAL SALPINGECTOMY;  Surgeon: Hildred Laser, MD;  Location: [facility];  Service: Gynecology;  Laterality: Bilateral;   TENDON TRANSFER Left 06/06/2018   Procedure: Repair / Reconstruction, Open Extensor Carpi Ulnaris Debridement Repair and Stabilization;  Surgeon: Dominica Severin, MD;  Location: [facility];  Service: Orthopedics;  Laterality: Left;   WRIST ARTHROSCOPY Left 06/06/2018   Procedure: Left wrist arthroscopy with synovectomy;  Surgeon: Dominica Severin, MD;  Location: [facility];  Service: Orthopedics;  Laterality: Left;  90 mins    Current Medications: Current Meds  Medication Sig   albuterol (VENTOLIN HFA) 108 (90  Base) MCG/ACT inhaler Inhale 1-2 puffs into the lungs every 6 (six) hours as needed for wheezing or shortness of breath.   atorvastatin (LIPITOR) 40 MG tablet Take 40 mg by mouth daily.   cetirizine (ZYRTEC) 10 MG tablet TAKE 1 TABLET BY MOUTH EVERY DAY   gabapentin (NEURONTIN) 100 MG capsule Take 100 mg by mouth daily.   guaiFENesin-codeine 100-10 MG/5ML  syrup Take 5 mLs by mouth every 6 (six) hours as needed for cough.   metFORMIN (GLUCOPHAGE) 1000 MG tablet Take 1 tablet (1,000 mg total) by mouth 2 (two) times daily with a meal. (Patient taking differently: Take 500 mg by mouth 2 (two) times daily with a meal. Taking two 500 mg tablets 2x daily, taken in the morning and evening)   metoprolol tartrate (LOPRESSOR) 100 MG tablet Take 1 tablet (100 mg) by mouth 2 hours prior to your Cardia CT   OZEMPIC, 2 MG/DOSE, 8 MG/3ML SOPN Inject 2 mg as directed once a week.   pantoprazole (PROTONIX) 40 MG tablet Take 1 tablet (40 mg total) by mouth daily.   TRESIBA FLEXTOUCH 100 UNIT/ML FlexTouch Pen Inject 40 Units into the skin at bedtime.     Allergies:   Meloxicam and Tramadol   Social History   Socioeconomic History   Marital status: Single    Spouse name: Not on file   Number of children: Not on file   Years of education: Not on file   Highest education level: Not on file  Occupational History   Not on file  Tobacco Use   Smoking status: Never   Smokeless tobacco: Never  Vaping Use   Vaping Use: Never used  Substance and Sexual Activity   Alcohol use: No   Drug use: No   Sexual activity: Not Currently    Birth control/protection: Surgical  Other Topics Concern   Not on file  Social History Narrative   Lives locally by herself.  Does not routinely exercise.   Social Determinants of Health   Financial Resource Strain: Not on file  Food Insecurity: No Food Insecurity (12/09/2022)   Hunger Vital Sign    Worried About Running Out of Food in the Last Year: Never true    Ran Out of Food in the Last Year: Never true  Transportation Needs: No Transportation Needs (12/09/2022)   PRAPARE - Administrator, Civil Service (Medical): No    Lack of Transportation (Non-Medical): No  Physical Activity: Not on file  Stress: Not on file  Social Connections: Not on file     Family History: The patient's family history includes  Diabetes in her maternal grandfather, maternal grandmother, maternal uncle, and mother; Hypertension in her mother; Hyperthyroidism in her maternal aunt and mother.  ROS:   Please see the history of present illness.     All other systems reviewed and are negative.  EKGs/Labs/Other Studies Reviewed:    The following studies were reviewed today: Cardiac Studies & Procedures       ECHOCARDIOGRAM  ECHOCARDIOGRAM COMPLETE 12/09/2022  Narrative ECHOCARDIOGRAM REPORT    Patient Name:   Sharon Marquez [facility] Date of Exam: 12/09/2022 Medical Rec #:  [id number]      Height:       63.0 in Accession #:    [id number]     Weight:       218.0 lb Date of Birth:  Jun 16, 1971      BSA:          2.006 m Patient Age:  51 years       BP:           130/75 mmHg Patient Gender: F              HR:           74 bpm. Exam Location:  [facility]  Procedure: 2D Echo  Indications:     Chest Pain R07.9  History:         Patient has no prior history of Echocardiogram examinations.  Sonographer:     Overton Mam RDCS Referring Phys:  [address] [place] Diagnosing Phys: Chilton Si MD  IMPRESSIONS   1. Left ventricular ejection fraction, by estimation, is 50 to 55%. The left ventricle has low normal function. The left ventricle has no regional wall motion abnormalities. There is mild left ventricular hypertrophy of the septal segment. Left ventricular diastolic parameters are consistent with Grade I diastolic dysfunction (impaired relaxation). 2. Right ventricular systolic function is normal. The right ventricular size is normal. 3. The mitral valve is normal in structure. Trivial mitral valve regurgitation. No evidence of mitral stenosis. 4. The aortic valve is tricuspid. Aortic valve regurgitation is not visualized. No aortic stenosis is present. 5. The inferior vena cava is normal in size with <50% respiratory variability, suggesting right atrial pressure of 8 mmHg.  FINDINGS Left Ventricle:  Left ventricular ejection fraction, by estimation, is 50 to 55%. The left ventricle has low normal function. The left ventricle has no regional wall motion abnormalities. The left ventricular internal cavity size was normal in size. There is mild left ventricular hypertrophy of the septal segment. Left ventricular diastolic parameters are consistent with Grade I diastolic dysfunction (impaired relaxation). Normal left ventricular filling pressure.  Right Ventricle: The right ventricular size is normal. No increase in right ventricular wall thickness. Right ventricular systolic function is normal.  Left Atrium: Left atrial size was normal in size.  Right Atrium: Right atrial size was normal in size.  Pericardium: There is no evidence of pericardial effusion.  Mitral Valve: The mitral valve is normal in structure. Trivial mitral valve regurgitation. No evidence of mitral valve stenosis.  Tricuspid Valve: The tricuspid valve is normal in structure. Tricuspid valve regurgitation is not demonstrated. No evidence of tricuspid stenosis.  Aortic Valve: The aortic valve is tricuspid. Aortic valve regurgitation is not visualized. No aortic stenosis is present. Aortic valve peak gradient measures 8.0 mmHg.  Pulmonic Valve: The pulmonic valve was normal in structure. Pulmonic valve regurgitation is not visualized. No evidence of pulmonic stenosis.  Aorta: The aortic root is normal in size and structure.  Venous: The inferior vena cava is normal in size with less than 50% respiratory variability, suggesting right atrial pressure of 8 mmHg.  IAS/Shunts: No atrial level shunt detected by color flow Doppler.   LEFT VENTRICLE PLAX 2D LVIDd:         4.70 cm     Diastology LVIDs:         3.20 cm     LV e' medial:    6.42 cm/s LV PW:         1.00 cm     LV E/e' medial:  9.6 LV IVS:        1.10 cm     LV e' lateral:   11.20 cm/s LVOT diam:     2.10 cm     LV E/e' lateral: 5.5 LV SV:         68 LV SV  Index:  34 LVOT Area:     3.46 cm  LV Volumes (MOD) LV vol d, MOD A4C: 83.3 ml LV vol s, MOD A4C: 36.4 ml LV SV MOD A4C:     83.3 ml  RIGHT VENTRICLE RV Basal diam:  2.90 cm RV S prime:     14.00 cm/s  LEFT ATRIUM             Index        RIGHT ATRIUM           Index LA diam:        4.40 cm 2.19 cm/m   RA Area:     13.80 cm LA Vol (A2C):   44.3 ml 22.09 ml/m  RA Volume:   30.60 ml  15.26 ml/m LA Vol (A4C):   59.1 ml 29.46 ml/m LA Biplane Vol: 53.2 ml 26.52 ml/m AORTIC VALVE                 PULMONIC VALVE AV Area (Vmax): 2.04 cm     PV Vmax:       1.28 m/s AV Vmax:        141.00 cm/s  PV Peak grad:  6.6 mmHg AV Peak Grad:   8.0 mmHg LVOT Vmax:      83.00 cm/s LVOT Vmean:     55.900 cm/s LVOT VTI:       0.195 m  AORTA Ao Root diam: 2.80 cm Ao Asc diam:  2.90 cm  MITRAL VALVE MV Area (PHT): 3.66 cm    SHUNTS MV Decel Time: 207 msec    Systemic VTI:  0.20 m MV E velocity: 61.80 cm/s  Systemic Diam: 2.10 cm MV A velocity: 81.80 cm/s MV E/A ratio:  0.76  Chilton Si MD Electronically signed by Chilton Si MD Signature Date/Time: 12/09/2022/11:51:56 AM    Final              EKG:  EKG is ordered today.  The ekg ordered today demonstrates normal sinus rhythm, HR 70 BPM. Minimal voltage criteria for LVH. No changes compared to EKG from 4/15   Recent Labs: 12/08/2022: ALT 20 12/09/2022: B Natriuretic Peptide 9.9 12/10/2022: BUN 8; Creatinine, Ser 0.53; Hemoglobin 11.4; Magnesium 1.9; Platelets 236; Potassium 3.6; Sodium 138  Recent Lipid Panel    Component Value Date/Time   CHOL 101 12/09/2022 0349   TRIG 80 12/09/2022 0349   HDL 41 12/09/2022 0349   CHOLHDL 2.5 12/09/2022 0349   VLDL 16 12/09/2022 0349   LDLCALC 44 12/09/2022 0349     Risk Assessment/Calculations:                Physical Exam:    VS:  BP 136/88   Pulse 70   Ht 5\' 3"  (1.6 m)   Wt 220 lb 6.4 oz (100 kg)   LMP 07/07/2017   SpO2 100%   BMI 39.04 kg/m     Wt Readings  from Last 3 Encounters:  12/27/22 220 lb 6.4 oz (100 kg)  12/10/22 226 lb 9.6 oz (102.8 kg)  04/20/22 233 lb (105.7 kg)     GEN: Well nourished, well developed in no acute distress. Sitting comfortably on the exam table  HEENT: Normal NECK: No JVD; No carotid bruits CARDIAC: RRR, no murmurs, rubs, gallops. Radial pulses 2+ bilaterally  RESPIRATORY:  Clear to auscultation without rales, wheezing or rhonchi. Normal WOB on room air   ABDOMEN: Soft, non-tender, non-distended MUSCULOSKELETAL:  No edema in BLE; No deformity  SKIN: Warm  and dry NEUROLOGIC:  Alert and oriented x 3 PSYCHIATRIC:  Normal affect   ASSESSMENT:    1. Precordial pain   2. DOE (dyspnea on exertion)    PLAN:    In order of problems listed above:  Atypical chest pain  DOE  - Patient was recently admitted from 4/13-4/16 with cough, vomiting, diarrhea, fever, chills, nausea. Developed chest pain that was worse with palpation, upper body movements, and deep breathing. hsTn negative x3. ESR and CRP within normal limits  - Echocardiogram 4/14 showed EF 50-55%, no regional wall motion abnormalities, mild LVH, grade 1 DD, normal RV systolic function. No pericardial effusion  - Patient has recovered from her acute illness. Continues to have occasional chest pain that is reproducible on palpation.  - She also complains of ongoing shortness of breath for the past month. Notices that she has to take more breaks than usual when walking  - She does have risk factors for CAD including DM, obesity, family history - Ordered coronary CT to rule out CAD as a cause of DOE and chest pain. Ordered BMP to assess renal function prior to receiving contrast dye and metoprolol to slow HR for scan   Type 2 DM  - Managed by PCP, on insulin, metformin, and ozempic   Medication Adjustments/Labs and Tests Ordered: Current medicines are reviewed at length with the patient today.  Concerns regarding medicines are outlined above.  Orders Placed  This Encounter  Procedures   CT CORONARY MORPH W/CTA COR W/SCORE W/CA W/CM &/OR WO/CM   Basic metabolic panel   Meds ordered this encounter  Medications   metoprolol tartrate (LOPRESSOR) 100 MG tablet    Sig: Take 1 tablet (100 mg) by mouth 2 hours prior to your Cardia CT    Dispense:  1 tablet    Refill:  0    Patient Instructions  Medication Instructions:  - Your physician recommends that you continue on your current medications as directed. Please refer to the Current Medication list given to you today.  *If you need a refill on your cardiac medications before your next appointment, please call your pharmacy*   Lab Work: - Your physician recommends that you have lab work today:  [employer]  Medical Mall Entrance at [facility] 1st desk on the right to check in (REGISTRATION)  Lab hours: Monday- Friday (7:30 am- 5:30 pm)   If you have labs (blood work) drawn today and your tests are completely normal, you will receive your results only by: MyChart Message (if you have MyChart) OR A paper copy in the mail If you have any lab test that is abnormal or we need to change your treatment, we will call you to review the results.   Testing/Procedures:  1) Cardiac CT: - Your physician has requested that you have cardiac CT. Cardiac computed tomography (CT) is a painless test that uses an x-ray machine to take clear, detailed pictures of your heart.     Your cardiac CT will be scheduled at one of the below locations:    [facility] [address] [place], [place] [address] [phone]  OR   [facility] [address] [place], [place] [address] [phone]   If scheduled at [facility] or [facility], please arrive 15 mins early for check-in and test prep.   Please follow these instructions carefully (unless otherwise directed):   On the Night Before the  Test: Be sure  to Drink plenty of water. Do not consume any caffeinated/decaffeinated beverages or chocolate 12 hours prior to your test. Do not take any antihistamines 12 hours prior to your test.   On the Day of the Test: Drink plenty of water until 1 hour prior to the test. Do not eat any food 1 hour prior to test. You may take your regular medications prior to the test.  Take metoprolol (Lopressor) 100 mg two hours prior to test. FEMALES- please wear underwire-free bra if available, avoid dresses & tight clothing.        After the Test: Drink plenty of water. After receiving IV contrast, you may experience a mild flushed feeling. This is normal. On occasion, you may experience a mild rash up to 24 hours after the test. This is not dangerous. If this occurs, you can take Benadryl 25 mg and increase your fluid intake. If you experience trouble breathing, this can be serious. If it is severe call 911 IMMEDIATELY. If it is mild, please call our office. If you take any of these medications: Glipizide/Metformin, Avandament, Glucavance, please do not take 48 hours after completing test unless otherwise instructed.  We will call to schedule your test 2-4 weeks out understanding that some insurance companies will need an authorization prior to the service being performed.   For non-scheduling related questions, please contact the cardiac imaging nurse navigator should you have any questions/concerns: Rockwell [place], Cardiac Imaging Nurse Navigator Larey Brick, Cardiac Imaging Nurse Navigator [facility] Direct Office Dial: [phone]   For scheduling needs, including cancellations and rescheduling, please call [place], [phone].     Follow-Up: At [facility], you and your health needs are our priority.  As part of our continuing mission to provide you with exceptional heart care, we have created designated Provider Care Teams.  These Care  Teams include your primary Cardiologist (physician) and Advanced Practice Providers (APPs -  Physician Assistants and Nurse Practitioners) who all work together to provide you with the care you need, when you need it.  We recommend signing up for the patient portal called "MyChart".  Sign up information is provided on this After Visit Summary.  MyChart is used to connect with patients for Virtual Visits (Telemedicine).  Patients are able to view lab/test results, encounter notes, upcoming appointments, etc.  Non-urgent messages can be sent to your provider as well.   To learn more about what you can do with MyChart, go to [email or web address].    Your next appointment:   3 month(s)  Provider:    Nicolasa Ducking, NP     Other Instructions  Cardiac CT Angiogram A cardiac CT angiogram is a procedure to look at the heart and the area around the heart. It may be done to help find the cause of chest pains or other symptoms of heart disease. During this procedure, a substance called contrast dye is injected into a vein in the arm. The contrast highlights the blood vessels in the area to be checked. A large X-ray machine (CT scanner), then takes detailed pictures of the heart and the surrounding area. The procedure is also sometimes called a coronary CT angiogram, coronary artery scanning, or CTA. A cardiac CT angiogram allows the health care provider to see how well blood is flowing to and from the heart. The provider will be able to see if there are any problems, such as: Blockage or narrowing of the arteries in the heart. Fluid around the heart. Signs  of weakness or disease in the muscles, valves, and tissues of the heart. Tell a health care provider about: Any allergies you have. This is especially important if you have had a previous allergic reaction to medicines, contrast dye, or iodine. All medicines you are taking, including vitamins, herbs, eye drops, creams, and over-the-counter  medicines. Any bleeding problems you have. Any surgeries you have had. Any medical conditions you have, including kidney problems or kidney failure. Whether you are pregnant or may be pregnant. Any anxiety disorders, chronic pain, or other conditions you have. These may increase your stress or prevent you from lying still. Any history of abnormal heart rhythms or heart procedures. What are the risks? Your provider will talk with you about risks. These may include: Bleeding. Infection. Allergic reactions to medicines or dyes. Damage to other structures or organs. Kidney damage from the contrast dye. Increased risk of cancer from radiation exposure. This risk is low. Talk with your provider about: The risks and benefits of testing. How you can receive the lowest dose of radiation. What happens before the procedure? Wear comfortable clothing and remove any jewelry, glasses, dentures, and hearing aids. Follow instructions from your provider about eating and drinking. These may include: 12 hours before the procedure Avoid caffeine. This includes tea, coffee, soda, energy drinks, and diet pills. Drink plenty of water or other fluids that do not have caffeine in them. Being well hydrated can prevent complications. 4-6 hours before the procedure Stop eating and drinking. This will reduce the risk of nausea from the contrast dye. Ask your provider about changing or stopping your regular medicines. These include: Diabetes medicines. Medicines to treat problems with erections (erectile dysfunction). If you have kidney problems, you may need to receive IV hydration before and after the test. What happens during the procedure?  Hair on your chest may need to be removed so that small sticky patches called electrodes can be placed on your chest. These will transmit information that helps to monitor your heart during the procedure. An IV will be inserted into one of your veins. You might be given a  medicine to control your heart rate during the procedure. This will help to ensure that good images are obtained. You will be asked to lie on an exam table. This table will slide in and out of the CT machine during the procedure. Contrast dye will be injected into the IV. You might feel warm, or you may get a metallic taste in your mouth. You may be given medicines to relax or dilate the arteries in your heart. If you are allergic to contrast dyes or iodine you may be given medicine before the test to reduce the risk of an allergic reaction. The table that you are lying on will move into the CT machine tunnel for the scan. The person running the machine will give you instructions while the scans are being done. You may be asked to: Keep your arms above your head. Hold your breath for short periods. Stay very still, even if the table is moving. The procedure may vary among providers and hospitals. What can I expect after the procedure? After your procedure, it is common to have: A metallic taste in your mouth from the contrast dye. A feeling of warmth. A headache from the heart medicine. Follow these instructions at home: Take over-the-counter and prescription medicines only as told by your provider. If you are told, drink enough fluid to keep your pee pale yellow. This will help  to flush the contrast dye out of your body. Most people can return to their normal activities right after the procedure. Ask your provider what activities are safe for you. It is up to you to get the results of your procedure. Ask your provider, or the department that is doing the procedure, when your results will be ready. Contact a health care provider if: You have any symptoms of allergy to the contrast dye. These include: Shortness of breath. Rash or hives. A racing heartbeat. You notice a change in your peeing (urination). This information is not intended to replace advice given to you by your health care  provider. Make sure you discuss any questions you have with your health care provider. Document Revised: 03/16/2022 Document Reviewed: 03/16/2022 Elsevier Patient Education  [address]     Signed, Jonita Albee, [place]  12/27/2022 11:33 AM    [facility]

## 2022-12-27 ENCOUNTER — Ambulatory Visit: Payer: BLUE CROSS/BLUE SHIELD | Attending: Nurse Practitioner | Admitting: Cardiology

## 2022-12-27 ENCOUNTER — Encounter: Payer: Self-pay | Admitting: Nurse Practitioner

## 2022-12-27 ENCOUNTER — Other Ambulatory Visit
Admission: RE | Admit: 2022-12-27 | Discharge: 2022-12-27 | Disposition: A | Discharge status: Home / Self Care | Payer: BLUE CROSS/BLUE SHIELD | Source: Ambulatory Visit | Attending: Nurse Practitioner | Admitting: Nurse Practitioner

## 2022-12-27 VITALS — BP 136/88 | HR 70 | Ht 63.0 in | Wt 220.4 lb

## 2022-12-27 DIAGNOSIS — R072 Precordial pain: Secondary | ICD-10-CM | POA: Diagnosis present

## 2022-12-27 DIAGNOSIS — R0609 Other forms of dyspnea: Secondary | ICD-10-CM

## 2022-12-27 LAB — BASIC METABOLIC PANEL
Anion gap: 8 (ref 5–15)
BUN: 10 mg/dL (ref 6–20)
CO2: 26 mmol/L (ref 22–32)
Calcium: 8.9 mg/dL (ref 8.9–10.3)
Chloride: 105 mmol/L (ref 98–111)
Creatinine, Ser: 0.52 mg/dL (ref 0.44–1.00)
GFR, Estimated: >60 mL/min (ref >60)
Glucose, Bld: 141 mg/dL — ABNORMAL HIGH (ref 70–99)
Potassium: 3.5 mmol/L (ref 3.5–5.1)
Sodium: 139 mmol/L (ref 135–145)

## 2022-12-27 MED ORDER — PANTOPRAZOLE SODIUM 40 MG PO TBEC: 40.0000 mg | DELAYED_RELEASE_TABLET | Freq: Every day | ORAL | 2 refills | Status: DC

## 2022-12-27 MED ORDER — METOPROLOL TARTRATE 100 MG PO TABS: ORAL_TABLET | ORAL | 0 refills | Status: DC

## 2022-12-27 NOTE — Patient Instructions (Addendum)
Medication Instructions:  - Your physician recommends that you continue on your current medications as directed. Please refer to the Current Medication list given to you today.  *If you need a refill on your cardiac medications before your next appointment, please call your pharmacy*   Lab Work: - Your physician recommends that you have lab work today:  [employer]  Medical Mall Entrance at [facility] 1st desk on the right to check in (REGISTRATION)  Lab hours: Monday- Friday (7:30 am- 5:30 pm)   If you have labs (blood work) drawn today and your tests are completely normal, you will receive your results only by: MyChart Message (if you have MyChart) OR A paper copy in the mail If you have any lab test that is abnormal or we need to change your treatment, we will call you to review the results.   Testing/Procedures:  1) Cardiac CT: - Your physician has requested that you have cardiac CT. Cardiac computed tomography (CT) is a painless test that uses an x-ray machine to take clear, detailed pictures of your heart.     Your cardiac CT will be scheduled at one of the below locations:    [facility] [address] [place], [place] [address] [phone]  OR   [facility] [address] [place], [place] [address] [phone]   If scheduled at [facility] or [facility], please arrive 15 mins early for check-in and test prep.   Please follow these instructions carefully (unless otherwise directed):   On the Night Before the Test: Be sure to Drink plenty of water. Do not consume any caffeinated/decaffeinated beverages or chocolate 12 hours prior to your test. Do not take any antihistamines 12 hours prior to your test.   On the Day of the Test: Drink plenty of water until 1 hour prior to the test. Do not eat any food 1 hour prior to test. You may take your regular  medications prior to the test.  Take metoprolol (Lopressor) 100 mg two hours prior to test. FEMALES- please wear underwire-free bra if available, avoid dresses & tight clothing.        After the Test: Drink plenty of water. After receiving IV contrast, you may experience a mild flushed feeling. This is normal. On occasion, you may experience a mild rash up to 24 hours after the test. This is not dangerous. If this occurs, you can take Benadryl 25 mg and increase your fluid intake. If you experience trouble breathing, this can be serious. If it is severe call 911 IMMEDIATELY. If it is mild, please call our office. If you take any of these medications: Glipizide/Metformin, Avandament, Glucavance, please do not take 48 hours after completing test unless otherwise instructed.  We will call to schedule your test 2-4 weeks out understanding that some insurance companies will need an authorization prior to the service being performed.   For non-scheduling related questions, please contact the cardiac imaging nurse navigator should you have any questions/concerns: Rockwell Alexandria, Cardiac Imaging Nurse Navigator Larey Brick, Cardiac Imaging Nurse Navigator [facility] Direct Office Dial: [phone]   For scheduling needs, including cancellations and rescheduling, please call [place], [phone].     Follow-Up: At [facility], you and your health needs are our priority.  As part of our continuing mission to provide you with exceptional heart care, we have created designated Provider Care Teams.  These Care Teams include  your primary Cardiologist (physician) and Advanced Practice Providers (APPs -  Physician Assistants and Nurse Practitioners) who all work together to provide you with the care you need, when you need it.  We recommend signing up for the patient portal called "MyChart".  Sign up information is provided on this After Visit Summary.  MyChart  is used to connect with patients for Virtual Visits (Telemedicine).  Patients are able to view lab/test results, encounter notes, upcoming appointments, etc.  Non-urgent messages can be sent to your provider as well.   To learn more about what you can do with MyChart, go to [email or web address].    Your next appointment:   3 month(s)  Provider:    Nicolasa Ducking, NP     Other Instructions  Cardiac CT Angiogram A cardiac CT angiogram is a procedure to look at the heart and the area around the heart. It may be done to help find the cause of chest pains or other symptoms of heart disease. During this procedure, a substance called contrast dye is injected into a vein in the arm. The contrast highlights the blood vessels in the area to be checked. A large X-ray machine (CT scanner), then takes detailed pictures of the heart and the surrounding area. The procedure is also sometimes called a coronary CT angiogram, coronary artery scanning, or CTA. A cardiac CT angiogram allows the health care provider to see how well blood is flowing to and from the heart. The provider will be able to see if there are any problems, such as: Blockage or narrowing of the arteries in the heart. Fluid around the heart. Signs of weakness or disease in the muscles, valves, and tissues of the heart. Tell a health care provider about: Any allergies you have. This is especially important if you have had a previous allergic reaction to medicines, contrast dye, or iodine. All medicines you are taking, including vitamins, herbs, eye drops, creams, and over-the-counter medicines. Any bleeding problems you have. Any surgeries you have had. Any medical conditions you have, including kidney problems or kidney failure. Whether you are pregnant or may be pregnant. Any anxiety disorders, chronic pain, or other conditions you have. These may increase your stress or prevent you from lying still. Any history of abnormal heart  rhythms or heart procedures. What are the risks? Your provider will talk with you about risks. These may include: Bleeding. Infection. Allergic reactions to medicines or dyes. Damage to other structures or organs. Kidney damage from the contrast dye. Increased risk of cancer from radiation exposure. This risk is low. Talk with your provider about: The risks and benefits of testing. How you can receive the lowest dose of radiation. What happens before the procedure? Wear comfortable clothing and remove any jewelry, glasses, dentures, and hearing aids. Follow instructions from your provider about eating and drinking. These may include: 12 hours before the procedure Avoid caffeine. This includes tea, coffee, soda, energy drinks, and diet pills. Drink plenty of water or other fluids that do not have caffeine in them. Being well hydrated can prevent complications. 4-6 hours before the procedure Stop eating and drinking. This will reduce the risk of nausea from the contrast dye. Ask your provider about changing or stopping your regular medicines. These include: Diabetes medicines. Medicines to treat problems with erections (erectile dysfunction). If you have kidney problems, you may need to receive IV hydration before and after the test. What happens during the procedure?  Hair on your chest may need to be removed  so that small sticky patches called electrodes can be placed on your chest. These will transmit information that helps to monitor your heart during the procedure. An IV will be inserted into one of your veins. You might be given a medicine to control your heart rate during the procedure. This will help to ensure that good images are obtained. You will be asked to lie on an exam table. This table will slide in and out of the CT machine during the procedure. Contrast dye will be injected into the IV. You might feel warm, or you may get a metallic taste in your mouth. You may be given  medicines to relax or dilate the arteries in your heart. If you are allergic to contrast dyes or iodine you may be given medicine before the test to reduce the risk of an allergic reaction. The table that you are lying on will move into the CT machine tunnel for the scan. The person running the machine will give you instructions while the scans are being done. You may be asked to: Keep your arms above your head. Hold your breath for short periods. Stay very still, even if the table is moving. The procedure may vary among providers and hospitals. What can I expect after the procedure? After your procedure, it is common to have: A metallic taste in your mouth from the contrast dye. A feeling of warmth. A headache from the heart medicine. Follow these instructions at home: Take over-the-counter and prescription medicines only as told by your provider. If you are told, drink enough fluid to keep your pee pale yellow. This will help to flush the contrast dye out of your body. Most people can return to their normal activities right after the procedure. Ask your provider what activities are safe for you. It is up to you to get the results of your procedure. Ask your provider, or the department that is doing the procedure, when your results will be ready. Contact a health care provider if: You have any symptoms of allergy to the contrast dye. These include: Shortness of breath. Rash or hives. A racing heartbeat. You notice a change in your peeing (urination). This information is not intended to replace advice given to you by your health care provider. Make sure you discuss any questions you have with your health care provider. Document Revised: 03/16/2022 Document Reviewed: 03/16/2022 Elsevier Patient Education  2023 [employer].

## 2023-01-01 NOTE — Addendum Note (Signed)
Addended by: Auburn Bilberry D on: 01/01/2023 09:07 AM   Modules accepted: Orders

## 2023-01-08 ENCOUNTER — Other Ambulatory Visit (HOSPITAL_COMMUNITY): Payer: Self-pay

## 2023-01-08 ENCOUNTER — Encounter: Payer: Self-pay | Admitting: Oncology

## 2023-01-21 ENCOUNTER — Other Ambulatory Visit (HOSPITAL_COMMUNITY): Payer: Self-pay

## 2023-01-22 ENCOUNTER — Other Ambulatory Visit (HOSPITAL_COMMUNITY): Payer: Self-pay

## 2023-01-23 ENCOUNTER — Telehealth: Payer: Self-pay | Admitting: *Deleted

## 2023-01-23 NOTE — Telephone Encounter (Signed)
Patient has been made aware that her insurance will not cover the cardiac ct. This has been canceled. The patient stated that she has been feeling great. She will keep her follow up in August for now.

## 2023-01-31 ENCOUNTER — Ambulatory Visit: Admission: RE | Admit: 2023-01-31 | Payer: BLUE CROSS/BLUE SHIELD | Source: Ambulatory Visit

## 2023-02-06 ENCOUNTER — Other Ambulatory Visit: Payer: Self-pay | Admitting: Family Medicine

## 2023-02-06 DIAGNOSIS — Z1231 Encounter for screening mammogram for malignant neoplasm of breast: Secondary | ICD-10-CM

## 2023-02-08 ENCOUNTER — Encounter: Payer: Self-pay | Admitting: Oncology

## 2023-04-03 ENCOUNTER — Encounter: Payer: Self-pay | Admitting: Nurse Practitioner

## 2023-04-03 ENCOUNTER — Ambulatory Visit: Payer: BLUE CROSS/BLUE SHIELD | Attending: Nurse Practitioner | Admitting: Nurse Practitioner

## 2023-04-03 VITALS — BP 118/72 | HR 79 | Ht 64.0 in | Wt 212.5 lb

## 2023-04-03 DIAGNOSIS — E119 Type 2 diabetes mellitus without complications: Secondary | ICD-10-CM | POA: Diagnosis not present

## 2023-04-03 DIAGNOSIS — R072 Precordial pain: Secondary | ICD-10-CM | POA: Diagnosis not present

## 2023-04-03 DIAGNOSIS — R0609 Other forms of dyspnea: Secondary | ICD-10-CM

## 2023-04-03 MED ORDER — PANTOPRAZOLE SODIUM 40 MG PO TBEC: 40.0000 mg | DELAYED_RELEASE_TABLET | Freq: Every day | ORAL | 1 refills | Status: DC

## 2023-04-03 NOTE — Progress Notes (Signed)
Office Visit    Patient Name: DEOVION TROSS Date of Encounter: 04/03/2023  Primary Care Provider:  Shane Crutch, PA Primary Cardiologist:  None - seen by Lavell Islam, MD during hospitalization  Chief Complaint    52 y.o. female  w/ a h/o DM, obesity, normocytic anemia, B12 deficiency, and uterine fibroids, who presents for f/u after recent hospitalization for non-cardiac chest pain.  Past Medical History    Past Medical History:  Diagnosis Date   Diabetes mellitus without complication (HCC)    Diastolic dysfunction    a. 11/2022 Echo:  EF of 50-55% w/o rwma, mild LVH, GrI DD, nl RV size/fxn, and trivial MR.   Family history of adverse reaction to anesthesia    "mother has difficulty waking up after surgery ."   Fibroid uterus 2014   History of blood transfusion 2014, 2015   for symptomatic anemia   Non-cardiac chest pain 11/2022   Normocytic anemia    Synovitis of wrist    left   Vitamin B deficiency    Past Surgical History:  Procedure Laterality Date   ABDOMINAL HYSTERECTOMY     CHOLECYSTECTOMY     LAPAROSCOPIC VAGINAL HYSTERECTOMY WITH SALPINGECTOMY Bilateral 07/15/2017   Procedure: LAPAROSCOPIC ASSISTED VAGINAL HYSTERECTOMY WITH BILATERAL SALPINGECTOMY;  Surgeon: Hildred Laser, MD;  Location: [facility];  Service: Gynecology;  Laterality: Bilateral;   TENDON TRANSFER Left 06/06/2018   Procedure: Repair / Reconstruction, Open Extensor Carpi Ulnaris Debridement Repair and Stabilization;  Surgeon: Dominica Severin, MD;  Location: [facility];  Service: Orthopedics;  Laterality: Left;   WRIST ARTHROSCOPY Left 06/06/2018   Procedure: Left wrist arthroscopy with synovectomy;  Surgeon: Dominica Severin, MD;  Location: [facility];  Service: Orthopedics;  Laterality: Left;  90 mins    Allergies  Allergies  Allergen Reactions   Meloxicam Itching   Tramadol Itching and Swelling    History of Present Illness      52 y.o. y/o female w/ a h/o  DM, obesity, normocytic anemia, B12 deficiency, and uterine fibroids.  She was admitted to [facility] in 11/2022 due to retrosternal chest and epigastric pain, which was worse w/ palpation, upper body movements, and deep breathing.  This became associated w/ nausea and vomiting, prompting presentation.  Though electrolyte abnormailites were noted (K 3.0, Mg 1.6), despite prolonged symptoms, troponins were normal.  CTA chest neg for PE.  ESR/CRP were normal.  Echo showed an EF of 50-55% w/o rwma, mild LVH, GrI DD, nl RV size/fxn, and trivial MR.   Ms. Brando was last seen in cardiology clinic in 12/2022, at which time she reported ongoing, occasional chest pain.  Coronary CTA was ordered, however her insurance would not cover, and she deferred any further w/u.  Since then, she has continued to have intermittent "indigestion," described as a burning discomfort in her retrosternal area, sometimes worse w/ deep breathing, occurring several times/day, usually lasting 10-20 mins @ a time.  She has at times, awakened from sleep with the symptoms, and notes that pain will worsen to the point that she develops nausea and then vomits, which resolved symptoms.  All symptoms are similar to what she experienced prior to her hospitalization and April.  She also notes ongoing dyspnea on exertion.  She works at a [employer] and notes that she gets in about 17,000 steps daily but often has to stop due to dyspnea.  This has been this way since her hospitalization in April.  She is interested  in pursuing stress testing.  She does not experience palpitations, PND, orthopnea, dizziness, syncope, edema, or early satiety.  Home Medications    Current Outpatient Medications  Medication Sig Dispense Refill   albuterol (VENTOLIN HFA) 108 (90 Base) MCG/ACT inhaler Inhale 1-2 puffs into the lungs every 6 (six) hours as needed for wheezing or shortness of breath. 8 g 0   atorvastatin (LIPITOR) 40 MG tablet Take 40 mg by mouth daily.      budesonide-formoterol (SYMBICORT) 160-4.5 MCG/ACT inhaler Inhale 2 puffs into the lungs 2 (two) times daily.     calcium carbonate (TUMS) 500 MG chewable tablet Chew 1 tablet by mouth daily as needed for indigestion or heartburn.     cetirizine (ZYRTEC) 10 MG tablet TAKE 1 TABLET BY MOUTH EVERY DAY 90 tablet 3   Continuous Glucose Sensor (DEXCOM G7 SENSOR) MISC      guaiFENesin-codeine 100-10 MG/5ML syrup Take 5 mLs by mouth every 6 (six) hours as needed for cough. 120 mL 0   metFORMIN (GLUCOPHAGE-XR) 500 MG 24 hr tablet Take 500 mg by mouth 2 (two) times daily.     nystatin cream (MYCOSTATIN) Apply 1 Application topically 2 (two) times daily.     OZEMPIC, 2 MG/DOSE, 8 MG/3ML SOPN Inject 2 mg as directed once a week.     pantoprazole (PROTONIX) 40 MG tablet Take 1 tablet (40 mg total) by mouth daily. 30 tablet 1   TRESIBA FLEXTOUCH 100 UNIT/ML FlexTouch Pen Inject 40 Units into the skin at bedtime.     gabapentin (NEURONTIN) 100 MG capsule Take 100 mg by mouth daily. (Patient not taking: Reported on 04/03/2023)     No current facility-administered medications for this visit.     Review of Systems    Chest and epigastric pain as outlined above.  She has ongoing dyspnea on exertion.  She denies palpitations, PND, orthopnea, dizziness, syncope, edema, or early satiety.  All other systems reviewed and are otherwise negative except as noted above.    Physical Exam    VS:  BP 118/72 (BP Location: Left Arm, Patient Position: Sitting, Cuff Size: Normal)   Pulse 79   Ht 5\' 4"  (1.626 m)   Wt 212 lb 8 oz (96.4 kg)   LMP 07/07/2017   SpO2 99%   BMI 36.48 kg/m  , BMI Body mass index is 36.48 kg/m.     GEN: Well nourished, well developed, in no acute distress. HEENT: normal. Neck: Supple, no JVD, carotid bruits, or masses. Cardiac: RRR, no murmurs, rubs, or gallops. No clubbing, cyanosis, edema.  Radials 2+/PT 2+ and equal bilaterally.  Respiratory:  Respirations regular and unlabored, clear  to auscultation bilaterally. GI: Soft, nontender, nondistended, BS + x 4. MS: no deformity or atrophy. Skin: warm and dry, no rash. Neuro:  Strength and sensation are intact. Psych: Normal affect.  Accessory Clinical Findings    ECG personally reviewed by me today - EKG Interpretation Date/Time:  Wednesday April 03 2023 10:26:30 EDT Ventricular Rate:  79 PR Interval:  168 QRS Duration:  96 QT Interval:  406 QTC Calculation: 465 R Axis:   -22  Text Interpretation: Normal sinus rhythm Minimal voltage criteria for LVH, may be normal variant ( Cornell product ) Confirmed by Nicolasa Ducking [phone]) on 04/03/2023 11:01:11 AM  - no acute changes.  Lab Results  Component Value Date   WBC 4.2 12/10/2022   HGB 11.4 (L) 12/10/2022   HCT 34.5 (L) 12/10/2022   MCV 88.9 12/10/2022   PLT  236 12/10/2022   Lab Results  Component Value Date   CREATININE 0.52 12/27/2022   BUN 10 12/27/2022   NA 139 12/27/2022   K 3.5 12/27/2022   CL 105 12/27/2022   CO2 26 12/27/2022   Lab Results  Component Value Date   ALT 20 12/08/2022   AST 21 12/08/2022   ALKPHOS 76 12/08/2022   BILITOT 0.5 12/08/2022   Lab Results  Component Value Date   CHOL 101 12/09/2022   HDL 41 12/09/2022   LDLCALC 44 12/09/2022   TRIG 80 12/09/2022   CHOLHDL 2.5 12/09/2022    Lab Results  Component Value Date   HGBA1C 8.0 (H) 12/08/2022    Assessment & Plan    1.  Precordial chest pain/dyspnea on exertion: Patient hospitalized in April with persistent chest pain, normal troponins, ESR, and CRP.  Echo showed EF of 50 to 55% without regional wall motion abnormalities.  At office follow-up in May, she reported ongoing symptoms and also dyspnea on exertion.  Coronary CT was ordered however, insurance would not pay for this.  She continues to have somewhat atypical chest discomfort and potentially more concerning, dyspnea on exertion.  She was agreeable to proceed with a [employer] (she does not think she can  walk on the treadmill for a sufficient amount of time due to dyspnea).  I also note that she is no longer taking Protonix and I have represcribed this.  2.  Type 2 diabetes mellitus: A1c was 8.0 in April.  Followed closely by primary care and she is currently receiving metformin, Tresiba, and Ozempic.  3.  Morbid obesity: Losing weight on Ozempic.  4.  Disposition: Follow-up Lexiscan Myoview.  Follow-up in clinic in 1 month or sooner if necessary.  Nicolasa Ducking, NP 04/03/2023, 11:07 AM

## 2023-04-03 NOTE — Patient Instructions (Signed)
Medication Instructions:   START Protonix 40 mg tablet by mouth daily.  *If you need a refill on your cardiac medications before your next appointment, please call your pharmacy*   Lab Work:  No lab work ordered today.  If you have labs (blood work) drawn today and your tests are completely normal, you will receive your results only by: MyChart Message (if you have MyChart) OR A paper copy in the mail If you have any lab test that is abnormal or we need to change your treatment, we will call you to review the results.   Testing/Procedures:  [facility] MYOVIEW  Your Provider has ordered a Stress Test with nuclear imaging. The purpose of this test is to evaluate the blood supply to your heart muscle. This procedure is referred to as a "Non-Invasive Stress Test." This is because other than having an IV started in your vein, nothing is inserted or "invades" your body. Cardiac stress tests are done to find areas of poor blood flow to the heart by determining the extent of coronary artery disease (CAD). Some patients exercise on a treadmill, which naturally increases the blood flow to your heart, while others who are unable to walk on a treadmill due to physical limitations have a pharmacologic/chemical stress agent called Lexiscan. This medicine will mimic walking on a treadmill by temporarily increasing your coronary blood flow.     REPORT TO [facility] MEDICAL MALL ENTRANCE  **Proceed to the 1st desk on the right, REGISTRATION, to check in**  Please note: this test may take anywhere between 2-4 hours to complete    Instructions regarding medication:   _ X__:   You may take all of your regular morning medications the day of your test unless listed below.   __X__:  Hold other medications as follows:   Please hold your Metformin and Tresiba the morning of procedure.    How to prepare for your Myoview test:  Do not eat or drink for 6 hours prior to the test No caffeine for 24 hours prior to  the test No smoking 24 hours prior to the test. Ladies, please do not wear dresses.  Skirts or pants are appropriate. Please wear a short sleeve shirt. No perfume, cologne or lotion. Wear comfortable walking shoes. No heels!   PLEASE NOTIFY THE OFFICE AT LEAST 24 HOURS IN ADVANCE IF YOU ARE UNABLE TO KEEP YOUR APPOINTMENT.  [phone] AND  PLEASE NOTIFY NUCLEAR MEDICINE AT [facility] AT LEAST 24 HOURS IN ADVANCE IF YOU ARE UNABLE TO KEEP YOUR APPOINTMENT. [phone]       Follow-Up: At [facility], you and your health needs are our priority.  As part of our continuing mission to provide you with exceptional heart care, we have created designated Provider Care Teams.  These Care Teams include your primary Cardiologist (physician) and Advanced Practice Providers (APPs -  Physician Assistants and Nurse Practitioners) who all work together to provide you with the care you need, when you need it.  We recommend signing up for the patient portal called "MyChart".  Sign up information is provided on this After Visit Summary.  MyChart is used to connect with patients for Virtual Visits (Telemedicine).  Patients are able to view lab/test results, encounter notes, upcoming appointments, etc.  Non-urgent messages can be sent to your provider as well.   To learn more about what you can do with MyChart, go to [email or web address].    Your next appointment:   1 month(s)  Provider:  Nicolasa Ducking, NP

## 2023-04-05 ENCOUNTER — Telehealth: Payer: Self-pay | Admitting: Pharmacy Technician

## 2023-04-05 ENCOUNTER — Other Ambulatory Visit (HOSPITAL_COMMUNITY): Payer: Self-pay

## 2023-04-05 NOTE — Telephone Encounter (Signed)
Pharmacy Patient Advocate Encounter   Received notification from CoverMyMeds that prior authorization for pantoprazole sodium 40 MG tab is required/requested.   Insurance verification completed.   The patient is insured through [facility] .   Per test claim: PA required; PA started via CoverMyMeds. KEY [id number] . Waiting for clinical questions to populate.

## 2023-04-09 ENCOUNTER — Other Ambulatory Visit: Payer: Self-pay

## 2023-04-09 ENCOUNTER — Emergency Department
Admission: EM | Admit: 2023-04-09 | Discharge: 2023-04-09 | Disposition: A | Discharge status: Home / Self Care | Payer: BLUE CROSS/BLUE SHIELD | Attending: Emergency Medicine | Admitting: Emergency Medicine

## 2023-04-09 DIAGNOSIS — R519 Headache, unspecified: Secondary | ICD-10-CM | POA: Insufficient documentation

## 2023-04-09 NOTE — ED Triage Notes (Signed)
Pt sts that her BP is elevated and she started to get a headache and than the left side of her neck started to hurt. Pt sts that she called her PCP and they advised that she come to the ED.

## 2023-04-09 NOTE — ED Provider Notes (Signed)
   [facility] Provider Note    Event Date/Time   First MD Initiated Contact with Patient 04/09/23 1248     (approximate)   History   Hypertension   HPI  Sharon Marquez is a 52 y.o. female who presents with complaints of high blood pressure.  Patient reports she had a headache this morning, checked her blood pressure and found to be elevated.  She went to work and felt slightly worse, blood pressure was checked there and was found to be elevated so she was referred to the emergency department.  She denies neurodeficits.  He is feeling much better now, blood pressure has normalized without antihypertensives.     Physical Exam   Triage Vital Signs: ED Triage Vitals [04/09/23 1156]  Encounter Vitals Group     BP 122/79     Systolic BP Percentile      Diastolic BP Percentile      Pulse Rate 81     Resp 16     Temp 98.1 F (36.7 C)     Temp Source Oral     SpO2 100 %     Weight 96.2 kg (212 lb)     Height 1.626 m (5\' 4" )     Head Circumference      Peak Flow      Pain Score 0     Pain Loc      Pain Education      Exclude from Growth Chart     Most recent vital signs: Vitals:   04/09/23 1156  BP: 122/79  Pulse: 81  Resp: 16  Temp: 98.1 F (36.7 C)  SpO2: 100%     General: Awake, no distress.  CV:  Good peripheral perfusion.  Resp:  Normal effort.  Abd:  No distention.  Other:  PERRLA, EOMI, normal neuroexam, cranial nerves II through XII are normal.   ED Results / Procedures / Treatments   Labs (all labs ordered are listed, but only abnormal results are displayed) Labs Reviewed - No data to display   EKG     RADIOLOGY     PROCEDURES:  Critical Care performed:   Procedures   MEDICATIONS ORDERED IN ED: Medications - No data to display   IMPRESSION / MDM / ASSESSMENT AND PLAN / ED COURSE  I reviewed the triage vital signs and the nursing notes. Patient's presentation is most consistent with acute illness /  injury with system symptoms.  Patient presents with headache, elevated blood pressure, now improved.  Blood pressure is normal, she is well-appearing with normal neuroexam.  No indication for further workup at this time, recommend supportive care, outpatient follow-up with PCP.        FINAL CLINICAL IMPRESSION(S) / ED DIAGNOSES   Final diagnoses:  Acute nonintractable headache, unspecified headache type     Rx / DC Orders   ED Discharge Orders     None        Note:  This document was prepared using Dragon voice recognition software and may include unintentional dictation errors.   Jene Every, MD 04/09/23 1500

## 2023-04-11 NOTE — Telephone Encounter (Signed)
Insurance wants patient to try OTC proton pump inhibitor: Omeprazole capsule (Prilosec OTC), Esomeprazole capsule (Nexium OTC), Lansoprazole capsule (Prevacid OTC)

## 2023-04-11 NOTE — Telephone Encounter (Signed)
Okay for patient to try OTC PPI of her choice.

## 2023-04-11 NOTE — Telephone Encounter (Signed)
Pharmacy Patient Advocate Encounter  Received notification from [facility] that Prior Authorization for Pantoprazole Sodium 40MG  dr tablets has been DENIED. Please advise how you'd like to proceed. Full denial letter will be uploaded to the media tab. See denial reason below.  Denied- no denial reasoning  PA #/Case ID/Reference #: [id number]

## 2023-04-11 NOTE — Telephone Encounter (Signed)
Called patient, advised of note below.  Patient will try OTC medications. Patient verbalized understanding.

## 2023-04-11 NOTE — Telephone Encounter (Signed)
Patient was returning call. Please advise ?

## 2023-04-11 NOTE — Telephone Encounter (Signed)
Left voicemail message to call back for review of information.  

## 2023-04-15 ENCOUNTER — Encounter
Admission: RE | Admit: 2023-04-15 | Discharge: 2023-04-15 | Disposition: A | Discharge status: Home / Self Care | Payer: BLUE CROSS/BLUE SHIELD | Source: Ambulatory Visit | Attending: Nurse Practitioner | Admitting: Nurse Practitioner

## 2023-04-15 DIAGNOSIS — R072 Precordial pain: Secondary | ICD-10-CM | POA: Insufficient documentation

## 2023-04-15 LAB — NM MYOCAR MULTI W/SPECT W/WALL MOTION / EF
LV dias vol: 125 mL (ref 46–106)
LV sys vol: 50 mL
Nuc Stress EF: 60 %
Peak HR: 100 {beats}/min
Rest HR: 66 {beats}/min
Rest Nuclear Isotope Dose: 10 mCi
SDS: 2
SRS: 22
SSS: 17
ST Depression (mm): 0 mm
Stress Nuclear Isotope Dose: 31.4 mCi
TID: 1.05

## 2023-04-15 MED ORDER — REGADENOSON 0.4 MG/5ML IV SOLN
0.4000 mg | Freq: Once | INTRAVENOUS | Status: AC
  Administered 2023-04-15: 0.4 mg via INTRAVENOUS

## 2023-04-15 MED ORDER — TECHNETIUM TC 99M TETROFOSMIN IV KIT
10.0100 | PACK | Freq: Once | INTRAVENOUS | Status: AC | PRN
  Administered 2023-04-15: 10.01 via INTRAVENOUS

## 2023-04-15 MED ORDER — TECHNETIUM TC 99M TETROFOSMIN IV KIT
31.3800 | PACK | Freq: Once | INTRAVENOUS | Status: AC | PRN
  Administered 2023-04-15: 31.38 via INTRAVENOUS

## 2023-04-23 ENCOUNTER — Emergency Department: Payer: BLUE CROSS/BLUE SHIELD

## 2023-04-23 ENCOUNTER — Emergency Department
Admission: EM | Admit: 2023-04-23 | Discharge: 2023-04-23 | Disposition: A | Discharge status: Home / Self Care | Payer: BLUE CROSS/BLUE SHIELD | Attending: Emergency Medicine | Admitting: Emergency Medicine

## 2023-04-23 ENCOUNTER — Other Ambulatory Visit: Payer: Self-pay

## 2023-04-23 DIAGNOSIS — R0602 Shortness of breath: Secondary | ICD-10-CM | POA: Insufficient documentation

## 2023-04-23 DIAGNOSIS — R0789 Other chest pain: Secondary | ICD-10-CM | POA: Insufficient documentation

## 2023-04-23 LAB — CBC
HCT: 28.8 % — ABNORMAL LOW (ref 36.0–46.0)
Hemoglobin: 9.7 g/dL — ABNORMAL LOW (ref 12.0–15.0)
MCH: 33.2 pg (ref 26.0–34.0)
MCHC: 33.7 g/dL (ref 30.0–36.0)
MCV: 98.6 fL (ref 80.0–100.0)
Platelets: 221 10*3/uL (ref 150–400)
RBC: 2.92 MIL/uL — ABNORMAL LOW (ref 3.87–5.11)
RDW: 18.6 % — ABNORMAL HIGH (ref 11.5–15.5)
WBC: 4.1 10*3/uL (ref 4.0–10.5)
nRBC: 0 % (ref 0.0–0.2)

## 2023-04-23 LAB — BASIC METABOLIC PANEL
Anion gap: 5 (ref 5–15)
BUN: 13 mg/dL (ref 6–20)
CO2: 25 mmol/L (ref 22–32)
Calcium: 8.4 mg/dL — ABNORMAL LOW (ref 8.9–10.3)
Chloride: 105 mmol/L (ref 98–111)
Creatinine, Ser: 0.61 mg/dL (ref 0.44–1.00)
GFR, Estimated: >60 mL/min (ref >60)
Glucose, Bld: 118 mg/dL — ABNORMAL HIGH (ref 70–99)
Potassium: 3.2 mmol/L — ABNORMAL LOW (ref 3.5–5.1)
Sodium: 135 mmol/L (ref 135–145)

## 2023-04-23 LAB — TROPONIN I (HIGH SENSITIVITY): Troponin I (High Sensitivity): <2 ng/L (ref <18)

## 2023-04-23 MED ORDER — IOHEXOL 350 MG/ML SOLN
75.0000 mL | Freq: Once | INTRAVENOUS | Status: AC | PRN
  Administered 2023-04-23: 75 mL via INTRAVENOUS

## 2023-04-23 NOTE — ED Provider Notes (Signed)
----------------------------------------- 7:37 PM on 04/23/2023 -----------------------------------------  Blood pressure 110/80, pulse 88, temperature 98 F (36.7 C), resp. rate 18, height 5\' 4"  (1.626 m), weight 94.3 kg, last menstrual period 07/07/2017, SpO2 100%.  Assuming care from [facility], PA-C.  In short, Sharon Marquez is a 52 y.o. female with a chief complaint of Chest Pain and Shortness of Breath .  Refer to the original H&P for additional details.  The current plan of care is to await CT angio chest results, if negative for PE patient can be discharged and follow-up with her cardiologist..  ____________________________________________    ED Results / Procedures / Treatments   Labs (all labs ordered are listed, but only abnormal results are displayed) Labs Reviewed  BASIC METABOLIC PANEL - Abnormal; Notable for the following components:      Result Value   Potassium 3.2 (*)    Glucose, Bld 118 (*)    Calcium 8.4 (*)    All other components within normal limits  CBC - Abnormal; Notable for the following components:   RBC 2.92 (*)    Hemoglobin 9.7 (*)    HCT 28.8 (*)    RDW 18.6 (*)    All other components within normal limits  TROPONIN I (HIGH SENSITIVITY)  TROPONIN I (HIGH SENSITIVITY)      RADIOLOGY  I personally viewed and evaluated these images as part of my medical decision making, as well as reviewing the written report by the radiologist.  ED Provider Interpretation: No evidence of pulmonary embolism, lungs are clear.  CT Angio Chest PE W/Cm &/Or Wo Cm  Result Date: 04/23/2023 CLINICAL DATA:  Pulmonary embolus suspected with low to intermediate probability. Negative D-dimer. Shortness of breath for 1 week. Chest pain starting today. EXAM: CT ANGIOGRAPHY CHEST WITH CONTRAST TECHNIQUE: Multidetector CT imaging of the chest was performed using the standard protocol during bolus administration of intravenous contrast. Multiplanar CT image  reconstructions and MIPs were obtained to evaluate the vascular anatomy. RADIATION DOSE REDUCTION: This exam was performed according to the departmental dose-optimization program which includes automated exposure control, adjustment of the mA and/or kV according to patient size and/or use of iterative reconstruction technique. CONTRAST:  75mL OMNIPAQUE IOHEXOL 350 MG/ML SOLN COMPARISON:  Chest radiograph 04/23/2023. FINDINGS: Cardiovascular: Satisfactory opacification of the pulmonary arteries to the segmental level. No evidence of pulmonary embolism. Normal heart size. No pericardial effusion. Normal caliber thoracic aorta. No dissection. Mediastinum/Nodes: Esophagus is decompressed. No significant lymphadenopathy. Thyroid gland is unremarkable. Lungs/Pleura: Lungs are clear. No pleural effusions. No pneumothorax. Upper Abdomen: Surgical absence of the gallbladder. No acute abnormalities. Musculoskeletal: Degenerative changes in the spine. Review of the MIP images confirms the above findings. IMPRESSION: 1. No evidence of significant pulmonary embolus. 2. Lungs are clear. Electronically Signed   By: Burman Nieves M.D.   On: 04/23/2023 19:15   DG Chest 2 View  Result Date: 04/23/2023 CLINICAL DATA:  Chest pain and shortness of breath EXAM: CHEST - 2 VIEW COMPARISON:  12/08/2022 FINDINGS: The heart size and mediastinal contours are within normal limits. Both lungs are clear. The visualized skeletal structures are unremarkable. IMPRESSION: No active cardiopulmonary disease. Electronically Signed   By: Judie Petit.  Shick M.D.   On: 04/23/2023 15:52     PROCEDURES:  Critical Care performed: No  Procedures   MEDICATIONS ORDERED IN ED: Medications  iohexol (OMNIPAQUE) 350 MG/ML injection 75 mL (75 mLs Intravenous Contrast Given 04/23/23 1828)     IMPRESSION / MDM / ASSESSMENT AND PLAN /  ED COURSE  I reviewed the triage vital signs and the nursing notes.                              Differential diagnosis  includes, but is not limited to, PE, SOB, atypical chest pain.  Patient's presentation is most consistent with acute complicated illness / injury requiring diagnostic workup.  Patient's diagnosis is consistent with shortness of breath and atypical chest pain. Patient is to follow up with cardiology as needed or otherwise directed. Patient is given ED precautions to return to the ED for any worsening or new symptoms.     FINAL CLINICAL IMPRESSION(S) / ED DIAGNOSES   Final diagnoses:  Shortness of breath  Atypical chest pain     Rx / DC Orders   ED Discharge Orders     None        Note:  This document was prepared using Dragon voice recognition software and may include unintentional dictation errors.    Cameron Ali, PA-C 04/23/23 1939    Corena Herter, MD 04/24/23 [phone]

## 2023-04-23 NOTE — ED Notes (Signed)
See triage note  Presents with a 1 week hx of SOB  but started having some chest pain today    Afebrile on arrival

## 2023-04-23 NOTE — ED Provider Notes (Signed)
[facility] Emergency Department Provider Note     Event Date/Time   First MD Initiated Contact with Patient 04/23/23 1724     (approximate)   History   Chest Pain and Shortness of Breath   HPI  Sharon Marquez is a 52 y.o. female presents to the ED for complaint of chest pain with onset of today with shortness of breath x 1 week.  Progressively worsening.  Patient reports while at work she felt a sharp pain to the left side of her chest. She began to feel dizzy with increased difficulty breathing and states seeing "black spots".  Patient reports she has never experienced pain like this before.  Patient reports the chest pain has subsided since the initial event but now the chest pain feels "tender"  and she continues to feel short of breath.  denies recent surgeries, recent travel, and vomiting.    Physical Exam   Triage Vital Signs: ED Triage Vitals  Encounter Vitals Group     BP 04/23/23 1431 111/77     Systolic BP Percentile --      Diastolic BP Percentile --      Pulse Rate 04/23/23 1431 91     Resp 04/23/23 1431 18     Temp 04/23/23 1431 98.1 F (36.7 C)     Temp src --      SpO2 04/23/23 1431 100 %     Weight 04/23/23 1432 208 lb (94.3 kg)     Height 04/23/23 1432 5\' 4"  (1.626 m)     Head Circumference --      Peak Flow --      Pain Score 04/23/23 1432 0     Pain Loc --      Pain Education --      Exclude from Growth Chart --     Most recent vital signs: Vitals:   04/23/23 1818 04/23/23 2018  BP: 110/80   Pulse: 88 79  Resp: 18 17  Temp: 98 F (36.7 C) 98.2 F (36.8 C)  SpO2: 100% 98%    General: Alert and oriented. INAD.  Skin:  Warm, dry and intact. No rashes or lesions noted.     Head:  NCAT.  Eyes:  PERRLA. EOMI.  CV:  Good peripheral perfusion. RRR. No peripheral edema.  RESP:  Normal effort. LCTAB. No retractions. no wheezing or rhonchi.  ABD:  No distention.  BACK:  Spinous process is midline without deformity or  tenderness.  MSK:   Full ROM in all joints. No swelling, deformity or tenderness.  NEURO: Cranial nerves intact. No focal deficits. Sensation and motor function intact.   ED Results / Procedures / Treatments   Labs (all labs ordered are listed, but only abnormal results are displayed) Labs Reviewed  BASIC METABOLIC PANEL - Abnormal; Notable for the following components:      Result Value   Potassium 3.2 (*)    Glucose, Bld 118 (*)    Calcium 8.4 (*)    All other components within normal limits  CBC - Abnormal; Notable for the following components:   RBC 2.92 (*)    Hemoglobin 9.7 (*)    HCT 28.8 (*)    RDW 18.6 (*)    All other components within normal limits  TROPONIN I (HIGH SENSITIVITY)  TROPONIN I (HIGH SENSITIVITY)   RADIOLOGY  I personally viewed and evaluated these images as part of my medical decision making, as well as reviewing the written report  by the radiologist.  ED Provider Interpretation: Chest x-ray and CT angio of chest is unremarkable.  CT Angio Chest PE W/Cm &/Or Wo Cm  Result Date: 04/23/2023 CLINICAL DATA:  Pulmonary embolus suspected with low to intermediate probability. Negative D-dimer. Shortness of breath for 1 week. Chest pain starting today. EXAM: CT ANGIOGRAPHY CHEST WITH CONTRAST TECHNIQUE: Multidetector CT imaging of the chest was performed using the standard protocol during bolus administration of intravenous contrast. Multiplanar CT image reconstructions and MIPs were obtained to evaluate the vascular anatomy. RADIATION DOSE REDUCTION: This exam was performed according to the departmental dose-optimization program which includes automated exposure control, adjustment of the mA and/or kV according to patient size and/or use of iterative reconstruction technique. CONTRAST:  75mL OMNIPAQUE IOHEXOL 350 MG/ML SOLN COMPARISON:  Chest radiograph 04/23/2023. FINDINGS: Cardiovascular: Satisfactory opacification of the pulmonary arteries to the segmental level.  No evidence of pulmonary embolism. Normal heart size. No pericardial effusion. Normal caliber thoracic aorta. No dissection. Mediastinum/Nodes: Esophagus is decompressed. No significant lymphadenopathy. Thyroid gland is unremarkable. Lungs/Pleura: Lungs are clear. No pleural effusions. No pneumothorax. Upper Abdomen: Surgical absence of the gallbladder. No acute abnormalities. Musculoskeletal: Degenerative changes in the spine. Review of the MIP images confirms the above findings. IMPRESSION: 1. No evidence of significant pulmonary embolus. 2. Lungs are clear. Electronically Signed   By: Burman Nieves M.D.   On: 04/23/2023 19:15   DG Chest 2 View  Result Date: 04/23/2023 CLINICAL DATA:  Chest pain and shortness of breath EXAM: CHEST - 2 VIEW COMPARISON:  12/08/2022 FINDINGS: The heart size and mediastinal contours are within normal limits. Both lungs are clear. The visualized skeletal structures are unremarkable. IMPRESSION: No active cardiopulmonary disease. Electronically Signed   By: Judie Petit.  Shick M.D.   On: 04/23/2023 15:52    PROCEDURES:  Critical Care performed: No  Procedures   MEDICATIONS ORDERED IN ED: Medications  iohexol (OMNIPAQUE) 350 MG/ML injection 75 mL (75 mLs Intravenous Contrast Given 04/23/23 1828)    IMPRESSION / MDM / ASSESSMENT AND PLAN / ED COURSE  I reviewed the triage vital signs and the nursing notes.                               52 y.o. female presents to the emergency department for evaluation and treatment of progressively worsening shortness of breath with acute onset of chest pain. See HPI for further details.   Differential diagnosis includes, but is not limited to AMI, PE, pericarditis, pneumonia, costochondritis.  Patient's presentation is most consistent with acute complicated illness / injury requiring diagnostic workup.  Patient is alert and oriented.  She is well-appearing and nontoxic at this time.  She is hemodynamically stable complaining of  shortness of breath worsened with exacerbation.  Lab work is reassuring.  Potassium is mildly low at 3.2.  Not low enough for potassium in the ED. Encouraged increasing potassium in diet. Calcium 8.4.  And anemia which appears to be her baseline.    EKG reveals normal sinus rhythm.  Chest x-ray is reassuring, however given patient's history CT angio ordered to rule out PE.  Results are reassuring.  Patient is stable for discharge home.  Close follow-up with her cardiologist is recommended.  ED precautions were provided to patient.  Patient verbalized understanding.  All questions were answered during this ED visit.   FINAL CLINICAL IMPRESSION(S) / ED DIAGNOSES   Final diagnoses:  Shortness of breath  Atypical chest pain  Rx / DC Orders   ED Discharge Orders     None      Note:  This document was prepared using Dragon voice recognition software and may include unintentional dictation errors.    Romeo Apple, Shevelle Smither A, PA-C 04/23/23 2054    Corena Herter, MD 04/24/23 [phone]

## 2023-04-23 NOTE — ED Triage Notes (Signed)
Pt to ED for shob x1 week, chest pain started this am. Denies pain at this time. Ambulatory to triage, RR even and unlabored, speaking in complete sentences.

## 2023-04-23 NOTE — Discharge Instructions (Addendum)
You were evaluated for chest pain and shortness of breath in the ED today.  Your chest x-ray is normal.  There is no detection of pneumonia.  Your EKG is reassuring.  Your CT angio chest is normal.  There is no evidence of a pulmonary embolism that would be causing your shortness of breath. Your labwork today is reassuring. Please schedule an appointment with your cardiologist for further evaluation and management.

## 2023-05-23 ENCOUNTER — Ambulatory Visit: Payer: BLUE CROSS/BLUE SHIELD | Attending: Nurse Practitioner | Admitting: Nurse Practitioner

## 2023-05-23 ENCOUNTER — Encounter: Payer: Self-pay | Admitting: Nurse Practitioner

## 2023-05-23 VITALS — BP 138/82 | HR 88 | Ht 63.5 in | Wt 212.6 lb

## 2023-05-23 DIAGNOSIS — R0609 Other forms of dyspnea: Secondary | ICD-10-CM

## 2023-05-23 DIAGNOSIS — E119 Type 2 diabetes mellitus without complications: Secondary | ICD-10-CM | POA: Diagnosis not present

## 2023-05-23 DIAGNOSIS — R072 Precordial pain: Secondary | ICD-10-CM

## 2023-05-23 NOTE — Progress Notes (Signed)
Office Visit    Patient Name: Sharon Marquez Date of Encounter: 05/23/2023  Primary Care Provider:  Shane Crutch, PA Primary Cardiologist:  None - seen by Lavell Islam, MD during hospitalization  Chief Complaint    52 y.o. female w/ a h/o DM, obesity, normocytic anemia, B12 deficiency, and uterine fibroids, who presents for f/u r/t noncardiac c/p and dyspnea.  Past Medical History    Past Medical History:  Diagnosis Date   Diabetes mellitus without complication (HCC)    Diastolic dysfunction    a. 11/2022 Echo:  EF of 50-55% w/o rwma, mild LVH, GrI DD, nl RV size/fxn, and trivial MR.   Family history of adverse reaction to anesthesia    "mother has difficulty waking up after surgery ."   Fibroid uterus 2014   History of blood transfusion 2014, 2015   for symptomatic anemia   Non-cardiac chest pain    a. 03/2023 Echo: Nl LV fxn. No ischemia/infarct. No signif cor/Ao Ca2+--> Low risk study.   Normocytic anemia    Synovitis of wrist    left   Vitamin B deficiency    Past Surgical History:  Procedure Laterality Date   ABDOMINAL HYSTERECTOMY     CHOLECYSTECTOMY     LAPAROSCOPIC VAGINAL HYSTERECTOMY WITH SALPINGECTOMY Bilateral 07/15/2017   Procedure: LAPAROSCOPIC ASSISTED VAGINAL HYSTERECTOMY WITH BILATERAL SALPINGECTOMY;  Surgeon: Hildred Laser, MD;  Location: [facility];  Service: Gynecology;  Laterality: Bilateral;   TENDON TRANSFER Left 06/06/2018   Procedure: Repair / Reconstruction, Open Extensor Carpi Ulnaris Debridement Repair and Stabilization;  Surgeon: Dominica Severin, MD;  Location: [facility];  Service: Orthopedics;  Laterality: Left;   WRIST ARTHROSCOPY Left 06/06/2018   Procedure: Left wrist arthroscopy with synovectomy;  Surgeon: Dominica Severin, MD;  Location: [facility];  Service: Orthopedics;  Laterality: Left;  90 mins    Allergies  Allergies  Allergen Reactions   Meloxicam Itching   Tramadol Itching and Swelling     History of Present Illness      52 y.o. y/o female w/ a h/o DM, obesity, normocytic anemia, B12 deficiency, and uterine fibroids.  She was admitted to [facility] in 11/2022 due to retrosternal chest and epigastric pain, which was worse w/ palpation, upper body movements, and deep breathing.  This became associated w/ nausea and vomiting, prompting presentation.  Though electrolyte abnormailites were noted (K 3.0, Mg 1.6), despite prolonged symptoms, troponins were normal.  CTA chest neg for PE.  ESR/CRP were normal.  Echo showed an EF of 50-55% w/o rwma, mild LVH, GrI DD, nl RV size/fxn, and trivial MR.  She was subsequently d/c'd and followed up in the office.  Coronary CT angiogram was ordered however, insurance would not cover and she deferred any further workup.  She continued to report intermittent chest discomfort has August 2024 follow-up, and Lexiscan Myoview was ordered and subsequently showed no evidence of ischemia or infarct with normal LV function, and no significant aortic or coronary calcifications.   Unfortunately, this [place] required repeat emergency department evaluation on April 23, 2023 in the setting of sharp left-sided chest pain and dizziness that occurred at work.  Due to residual chest wall tenderness, she presented to the emergency department where workup was notable for hypokalemia with potassium of 3.2, normocytic anemia with an H&H of 9.7/28.8, normal troponin, and normal CTA of the chest without evidence for PE or other reactive airway disease.  She was discharged home from the emergency department.  Since then,  she has done well w/o recurrent symptoms.  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  She has been losing weight and is down from 290 pounds to 212 pounds.  She occasionally notes dyspnea on exertion but otherwise has done well and denies palpitations, PND, orthopnea, dizziness, syncope, edema, or early satiety.   Home  Medications    Current Outpatient Medications  Medication Sig Dispense Refill   albuterol (VENTOLIN HFA) 108 (90 Base) MCG/ACT inhaler Inhale 1-2 puffs into the lungs every 6 (six) hours as needed for wheezing or shortness of breath. 8 g 0   atorvastatin (LIPITOR) 40 MG tablet Take 40 mg by mouth daily.     budesonide-formoterol (SYMBICORT) 160-4.5 MCG/ACT inhaler Inhale 2 puffs into the lungs 2 (two) times daily.     calcium carbonate (TUMS) 500 MG chewable tablet Chew 1 tablet by mouth daily as needed for indigestion or heartburn.     cetirizine (ZYRTEC) 10 MG tablet TAKE 1 TABLET BY MOUTH EVERY DAY 90 tablet 3   Continuous Glucose Sensor (DEXCOM G7 SENSOR) MISC      guaiFENesin-codeine 100-10 MG/5ML syrup Take 5 mLs by mouth every 6 (six) hours as needed for cough. 120 mL 0   metFORMIN (GLUCOPHAGE-XR) 500 MG 24 hr tablet Take 500 mg by mouth 2 (two) times daily.     nystatin cream (MYCOSTATIN) Apply 1 Application topically 2 (two) times daily.     OZEMPIC, 2 MG/DOSE, 8 MG/3ML SOPN Inject 2 mg as directed once a week.     pantoprazole (PROTONIX) 40 MG tablet Take 1 tablet (40 mg total) by mouth daily. 30 tablet 1   TRESIBA FLEXTOUCH 100 UNIT/ML FlexTouch Pen Inject 40 Units into the skin at bedtime.     gabapentin (NEURONTIN) 100 MG capsule Take 100 mg by mouth daily. (Patient not taking: Reported on 04/03/2023)     No current facility-administered medications for this visit.     Review of Systems    Feeling well.  She denies chest pain, palpitations, dyspnea, pnd, orthopnea, n, v, dizziness, syncope, edema, weight gain, or early satiety.  All other systems reviewed and are otherwise negative except as noted above.    Physical Exam    VS:  BP 138/82 (BP Location: Left Arm, Patient Position: Sitting, Cuff Size: Normal)   Pulse 88   Ht 5' 3.5" (1.613 m)   Wt 212 lb 9.6 oz (96.4 kg)   LMP 07/07/2017   SpO2 99%   BMI 37.07 kg/m  , BMI Body mass index is 37.07 kg/m.     GEN: Well  nourished, well developed, in no acute distress. HEENT: normal. Neck: Supple, no JVD, carotid bruits, or masses. Cardiac: RRR, no murmurs, rubs, or gallops. No clubbing, cyanosis, edema.  Radials 2+/PT 2+ and equal bilaterally.  Respiratory:  Respirations regular and unlabored, clear to auscultation bilaterally. GI: Soft, nontender, nondistended, BS + x 4. MS: no deformity or atrophy. Skin: warm and dry, no rash. Neuro:  Strength and sensation are intact. Psych: Normal affect.  Accessory Clinical Findings    Lab Results  Component Value Date   WBC 4.1 04/23/2023   HGB 9.7 (L) 04/23/2023   HCT 28.8 (L) 04/23/2023   MCV 98.6 04/23/2023   PLT 221 04/23/2023   Lab Results  Component Value Date   CREATININE 0.61 04/23/2023   BUN 13 04/23/2023   NA 135 04/23/2023   K 3.2 (L) 04/23/2023   CL 105 04/23/2023  CO2 25 04/23/2023   Lab Results  Component Value Date   ALT 20 12/08/2022   AST 21 12/08/2022   ALKPHOS 76 12/08/2022   BILITOT 0.5 12/08/2022   Lab Results  Component Value Date   CHOL 101 12/09/2022   HDL 41 12/09/2022   LDLCALC 44 12/09/2022   TRIG 80 12/09/2022   CHOLHDL 2.5 12/09/2022    Lab Results  Component Value Date   HGBA1C 8.0 (H) 12/08/2022    Assessment & Plan    1.  Precordial pain/dyspnea on exertion: Patient hospitalized in April 2024 with persistent chest pain, normal troponins, ESR, and CRP.  Echo showed EF of 55 to 55% without regional wall motion abnormalities.  She recently underwent Lexiscan Myoview in August 2024, which was low risk without ischemia, or evidence of coronary calcium.  She did have an episode of sharp chest pain and chest wall tenderness in late August with normal ED evaluation and has since been feeling well.  She is walking daily without symptoms or limitations.We discussed the importance of ongoing exercise and lifestyle modifications with focus on weight loss.  She is already lost 78 pounds.  No further ischemic evaluation is  warranted at this time.  2.  Type 2 diabetes mellitus: A1c 8.0 in April 2024.  She is followed closely by primary care and remains on metformin, Tresiba, and Ozempic.  3.  Morbid obesity: Weight stable since last month.  Encouraged ongoing exercise, dietary modifications, and weight loss.  4.  Disposition: Follow-up in 1 year or sooner as necessary.  Nicolasa Ducking, NP 05/23/2023, 8:56 AM

## 2023-05-23 NOTE — Patient Instructions (Signed)
Medication Instructions:  No changes *If you need a refill on your cardiac medications before your next appointment, please call your pharmacy*   Lab Work: None ordered If you have labs (blood work) drawn today and your tests are completely normal, you will receive your results only by: MyChart Message (if you have MyChart) OR A paper copy in the mail If you have any lab test that is abnormal or we need to change your treatment, we will call you to review the results.   Testing/Procedures: None ordered   Follow-Up: At [facility], you and your health needs are our priority.  As part of our continuing mission to provide you with exceptional heart care, we have created designated Provider Care Teams.  These Care Teams include your primary Cardiologist (physician) and Advanced Practice Providers (APPs -  Physician Assistants and Nurse Practitioners) who all work together to provide you with the care you need, when you need it.  We recommend signing up for the patient portal called "MyChart".  Sign up information is provided on this After Visit Summary.  MyChart is used to connect with patients for Virtual Visits (Telemedicine).  Patients are able to view lab/test results, encounter notes, upcoming appointments, etc.  Non-urgent messages can be sent to your provider as well.   To learn more about what you can do with MyChart, go to [email or web address].    Your next appointment:   12 month(s)  Provider:   Nicolasa Ducking, NP

## 2023-07-02 ENCOUNTER — Other Ambulatory Visit: Payer: Self-pay | Admitting: Family Medicine

## 2023-07-02 DIAGNOSIS — Z1231 Encounter for screening mammogram for malignant neoplasm of breast: Secondary | ICD-10-CM

## 2024-01-22 ENCOUNTER — Other Ambulatory Visit: Payer: Self-pay | Admitting: Family Medicine

## 2024-01-22 DIAGNOSIS — Z1231 Encounter for screening mammogram for malignant neoplasm of breast: Secondary | ICD-10-CM

## 2024-01-23 ENCOUNTER — Inpatient Hospital Stay
Admission: RE | Admit: 2024-01-23 | Discharge: 2024-01-23 | Disposition: A | Discharge status: Home / Self Care | Payer: Self-pay | Source: Ambulatory Visit | Attending: Family Medicine | Admitting: Family Medicine

## 2024-01-23 ENCOUNTER — Other Ambulatory Visit: Payer: Self-pay | Admitting: *Deleted

## 2024-01-23 DIAGNOSIS — Z1231 Encounter for screening mammogram for malignant neoplasm of breast: Secondary | ICD-10-CM

## 2024-02-04 ENCOUNTER — Ambulatory Visit
Admission: RE | Admit: 2024-02-04 | Discharge: 2024-02-04 | Disposition: A | Discharge status: Home / Self Care | Source: Ambulatory Visit | Attending: Family Medicine | Admitting: Family Medicine

## 2024-02-04 DIAGNOSIS — Z1231 Encounter for screening mammogram for malignant neoplasm of breast: Secondary | ICD-10-CM | POA: Diagnosis not present

## 2024-02-05 LAB — COLOGUARD: COLOGUARD: NEGATIVE

## 2024-05-27 ENCOUNTER — Encounter: Payer: Self-pay | Admitting: Oncology

## 2024-05-27 ENCOUNTER — Ambulatory Visit: Admitting: Nurse Practitioner

## 2024-05-27 NOTE — Progress Notes (Deleted)
 Office Visit    Patient Name: Sharon Marquez Date of Encounter: 05/27/2024  Primary Care Provider:  Donnie Handing, PA Primary Cardiologist:  None    Chief Complaint    53 y.o. female with a h/o noncardiac chest pain, HLD, DM, obesity, normocytic anemia, b12 deficiency who presents for one year f/u r/t previous precoridal chest pain/doe  Past Medical History   Subjective   Past Medical History:  Diagnosis Date   Diabetes mellitus without complication (HCC)    Diastolic dysfunction    a. 11/2022 Echo:  EF of 50-55% w/o rwma, mild LVH, GrI DD, nl RV size/fxn, and trivial MR.   Family history of adverse reaction to anesthesia    mother has difficulty waking up after surgery .   Fibroid uterus 2014   History of blood transfusion 2014, 2015   for symptomatic anemia   Non-cardiac chest pain    a. 03/2023 Echo: Nl LV fxn. No ischemia/infarct. No signif cor/Ao Ca2+--> Low risk study.   Normocytic anemia    Synovitis of wrist    left   Vitamin B deficiency    Past Surgical History:  Procedure Laterality Date   ABDOMINAL HYSTERECTOMY     CHOLECYSTECTOMY     LAPAROSCOPIC VAGINAL HYSTERECTOMY WITH SALPINGECTOMY Bilateral 07/15/2017   Procedure: LAPAROSCOPIC ASSISTED VAGINAL HYSTERECTOMY WITH BILATERAL SALPINGECTOMY;  Surgeon: Connell Davies, MD;  Location: [facility];  Service: Gynecology;  Laterality: Bilateral;   TENDON TRANSFER Left 06/06/2018   Procedure: Repair / Reconstruction, Open Extensor Carpi Ulnaris Debridement Repair and Stabilization;  Surgeon: Camella Fallow, MD;  Location: [facility];  Service: Orthopedics;  Laterality: Left;   WRIST ARTHROSCOPY Left 06/06/2018   Procedure: Left wrist arthroscopy with synovectomy;  Surgeon: Camella Fallow, MD;  Location: [facility];  Service: Orthopedics;  Laterality: Left;  90 mins    Allergies  Allergies  Allergen Reactions   Meloxicam  Itching   Tramadol  Itching and Swelling       History  of Present Illness      53 y.o. y/o female w/ a h/o DM, obesity, normocytic anemia, B12 deficiency, and uterine fibroids.  She was admitted to [facility] in 11/2022 due to retrosternal chest and epigastric pain, which was worse w/ palpation, upper body movements, and deep breathing.  This became associated w/ nausea and vomiting, prompting presentation.  Though electrolyte abnormailites were noted (K 3.0, Mg 1.6), despite prolonged symptoms, troponins were normal.  CTA chest neg for PE.  ESR/CRP were normal.  Echo showed an EF of 50-55% w/o rwma, mild LVH, GrI DD, nl RV size/fxn, and trivial MR.  Coronary CT angiogram was ordered however, insurance would not cover and she deferred any further workup.  She continued to report intermittent chest discomfort, had office f/u on 04/03/2023 with Lexiscan  Myoview  ordered and showed no evidence of ischemia or infarct with normal LV function, and no significant aortic or coronary calcifications.   She presented back to the ED on 04/23/2023 with chest pain, dypnea, and dizziness again with negative findings aside from hypokalema (3.2) and normocytic anemia (9.7/28.8). Subsequent f/u in office 05/23/2023 with overall improvement and focus on weight loss s/p losing 78 lbs already.      Objective   Home Medications    Current Outpatient Medications  Medication Sig Dispense Refill   albuterol  (VENTOLIN  HFA) 108 (90 Base) MCG/ACT inhaler Inhale 1-2 puffs into the lungs every 6 (six) hours as needed for wheezing or shortness of breath. 8 g  0   atorvastatin  (LIPITOR) 40 MG tablet Take 40 mg by mouth daily.     budesonide-formoterol (SYMBICORT) 160-4.5 MCG/ACT inhaler Inhale 2 puffs into the lungs 2 (two) times daily.     calcium  carbonate (TUMS) 500 MG chewable tablet Chew 1 tablet by mouth daily as needed for indigestion or heartburn.     cetirizine  (ZYRTEC ) 10 MG tablet TAKE 1 TABLET BY MOUTH EVERY DAY 90 tablet 3   Continuous Glucose Sensor (DEXCOM G7 SENSOR) MISC       gabapentin (NEURONTIN) 100 MG capsule Take 100 mg by mouth daily. (Patient not taking: Reported on 04/03/2023)     guaiFENesin -codeine  100-10 MG/5ML syrup Take 5 mLs by mouth every 6 (six) hours as needed for cough. 120 mL 0   metFORMIN  (GLUCOPHAGE -XR) 500 MG 24 hr tablet Take 500 mg by mouth 2 (two) times daily.     nystatin cream (MYCOSTATIN) Apply 1 Application topically 2 (two) times daily.     OZEMPIC, 2 MG/DOSE, 8 MG/3ML SOPN Inject 2 mg as directed once a week.     pantoprazole  (PROTONIX ) 40 MG tablet Take 1 tablet (40 mg total) by mouth daily. 30 tablet 1   TRESIBA FLEXTOUCH 100 UNIT/ML FlexTouch Pen Inject 40 Units into the skin at bedtime.     No current facility-administered medications for this visit.     Physical Exam    VS:  LMP 07/07/2017  , BMI There is no height or weight on file to calculate BMI.          GEN: Well nourished, well developed, in no acute distress. HEENT: normal. Neck: Supple, no JVD, carotid bruits, or masses. Cardiac: RRR, no murmurs, rubs, or gallops. No clubbing, cyanosis, edema.  Radials 2+/PT 2+ and equal bilaterally.  Respiratory:  Respirations regular and unlabored, clear to auscultation bilaterally. GI: Soft, nontender, nondistended, BS + x 4. MS: no deformity or atrophy. Skin: warm and dry, no rash. Neuro:  Strength and sensation are intact. Psych: Normal affect.  Accessory Clinical Findings    ECG personally reviewed by me today -    *** - no acute changes.  Lab Results  Component Value Date   WBC 4.1 04/23/2023   HGB 9.7 (L) 04/23/2023   HCT 28.8 (L) 04/23/2023   MCV 98.6 04/23/2023   PLT 221 04/23/2023   Lab Results  Component Value Date   CREATININE 0.61 04/23/2023   BUN 13 04/23/2023   NA 135 04/23/2023   K 3.2 (L) 04/23/2023   CL 105 04/23/2023   CO2 25 04/23/2023   Lab Results  Component Value Date   ALT 20 12/08/2022   AST 21 12/08/2022   ALKPHOS 76 12/08/2022   BILITOT 0.5 12/08/2022   Lab Results  Component  Value Date   CHOL 101 12/09/2022   HDL 41 12/09/2022   LDLCALC 44 12/09/2022   TRIG 80 12/09/2022   CHOLHDL 2.5 12/09/2022    Lab Results  Component Value Date   HGBA1C 8.0 (H) 12/08/2022   Lab Results  Component Value Date   TSH 1.820 04/16/2018       Assessment & Plan    1.  ***  Lonni Meager, NP 05/27/2024, 7:30 AM

## 2024-05-28 ENCOUNTER — Encounter: Payer: Self-pay | Admitting: *Deleted

## 2024-05-29 ENCOUNTER — Ambulatory Visit: Attending: Nurse Practitioner | Admitting: Nurse Practitioner

## 2024-05-29 ENCOUNTER — Encounter: Payer: Self-pay | Admitting: Nurse Practitioner

## 2024-05-29 VITALS — BP 100/70 | HR 89 | Ht 64.0 in | Wt 224.2 lb

## 2024-05-29 DIAGNOSIS — R072 Precordial pain: Secondary | ICD-10-CM | POA: Insufficient documentation

## 2024-05-29 DIAGNOSIS — E119 Type 2 diabetes mellitus without complications: Secondary | ICD-10-CM | POA: Diagnosis present

## 2024-05-29 DIAGNOSIS — R600 Localized edema: Secondary | ICD-10-CM | POA: Diagnosis present

## 2024-05-29 NOTE — Patient Instructions (Signed)
 Medication Instructions:  Your physician recommends that you continue on your current medications as directed. Please refer to the Current Medication list given to you today.   *If you need a refill on your cardiac medications before your next appointment, please call your pharmacy*  Lab Work: No labs ordered today  If you have labs (blood work) drawn today and your tests are completely normal, you will receive your results only by: MyChart Message (if you have MyChart) OR A paper copy in the mail If you have any lab test that is abnormal or we need to change your treatment, we will call you to review the results.  Testing/Procedures: Your physician has requested that you have an echocardiogram. Echocardiography is a painless test that uses sound waves to create images of your heart. It provides your doctor with information about the size and shape of your heart and how well your heart's chambers and valves are working.   You may receive an ultrasound enhancing agent through an IV if needed to better visualize your heart during the echo. This procedure takes approximately one hour.  There are no restrictions for this procedure.  This will take place at [address] [facility] [address], [place] [address]  Please note: We ask at that you not bring children with you during ultrasound (echo/ vascular) testing. Due to room size and safety concerns, children are not allowed in the ultrasound rooms during exams. Our front office staff cannot provide observation of children in our lobby area while testing is being conducted. An adult accompanying a patient to their appointment will only be allowed in the ultrasound room at the discretion of the ultrasound technician under special circumstances. We apologize for any inconvenience.   Follow-Up: At [facility], you and your health needs are our priority.  As part of our continuing mission to provide you with exceptional heart  care, our providers are all part of one team.  This team includes your primary Cardiologist (physician) and Advanced Practice Providers or APPs (Physician Assistants and Nurse Practitioners) who all work together to provide you with the care you need, when you need it.  Your next appointment:   1 year(s)  Provider:   You may see Lonni Meager, NP or one of the following Advanced Practice Providers on your designated Care Team:   Lesley Maffucci, PA-C Bernardino Bring, PA-C Cadence Franchester, PA-C Tylene Lunch, NP Barnie Hila, NP    We recommend signing up for the patient portal called MyChart.  Sign up information is provided on this After Visit Summary.  MyChart is used to connect with patients for Virtual Visits (Telemedicine).  Patients are able to view lab/test results, encounter notes, upcoming appointments, etc.  Non-urgent messages can be sent to your provider as well.   To learn more about what you can do with MyChart, go to [email or web address].

## 2024-05-29 NOTE — Progress Notes (Signed)
 Office Visit    Patient Name: Sharon Marquez Date of Encounter: 05/29/2024  Primary Care Provider:  Donnie Handing, PA Primary Cardiologist:  None  Cardiology APP:  Vivienne Lonni Ingle, NP   Chief Complaint    53 y.o. female w/ a h/o DM, obesity, normocytic anemia, B12 deficiency, and uterine fibroids, who presents for f/u r/t noncardiac c/p and dyspnea.   Past Medical History   Subjective   Past Medical History:  Diagnosis Date   Diabetes mellitus without complication (HCC)    Diastolic dysfunction    a. 11/2022 Echo:  EF of 50-55% w/o rwma, mild LVH, GrI DD, nl RV size/fxn, and trivial MR.   Family history of adverse reaction to anesthesia    mother has difficulty waking up after surgery .   Fibroid uterus 2014   History of blood transfusion 2014, 2015   for symptomatic anemia   Non-cardiac chest pain    a. 03/2023 Echo: Nl LV fxn. No ischemia/infarct. No signif cor/Ao Ca2+--> Low risk study.   Normocytic anemia    Synovitis of wrist    left   Vitamin B deficiency    Past Surgical History:  Procedure Laterality Date   ABDOMINAL HYSTERECTOMY     CHOLECYSTECTOMY     LAPAROSCOPIC VAGINAL HYSTERECTOMY WITH SALPINGECTOMY Bilateral 07/15/2017   Procedure: LAPAROSCOPIC ASSISTED VAGINAL HYSTERECTOMY WITH BILATERAL SALPINGECTOMY;  Surgeon: Connell Davies, MD;  Location: [facility];  Service: Gynecology;  Laterality: Bilateral;   TENDON TRANSFER Left 06/06/2018   Procedure: Repair / Reconstruction, Open Extensor Carpi Ulnaris Debridement Repair and Stabilization;  Surgeon: Camella Fallow, MD;  Location: [facility];  Service: Orthopedics;  Laterality: Left;   WRIST ARTHROSCOPY Left 06/06/2018   Procedure: Left wrist arthroscopy with synovectomy;  Surgeon: Camella Fallow, MD;  Location: [facility];  Service: Orthopedics;  Laterality: Left;  90 mins    Allergies  Allergies  Allergen Reactions   Meloxicam  Itching   Tramadol  Itching and  Swelling       History of Present Illness      53 y.o. y/o female w/ a h/o DM, obesity, normocytic anemia, B12 deficiency, and uterine fibroids.  She was admitted to [facility] in 11/2022 due to retrosternal chest and epigastric pain, which was worse w/ palpation, upper body movements, and deep breathing.  This became associated w/ nausea and vomiting, prompting presentation.  Though electrolyte abnormailites were noted (K 3.0, Mg 1.6), despite prolonged symptoms, troponins were normal.  CTA chest neg for PE.  ESR/CRP were normal.  Echo showed an EF of 50-55% w/o rwma, mild LVH, GrI DD, nl RV size/fxn, and trivial MR.  She was subsequently d/c'd and followed up in the office.  Coronary CT angiogram was ordered however, insurance would not cover and she deferred any further workup.  She continued to report intermittent chest discomfort has August 2024 follow-up, and Lexiscan  Myoview  was ordered and subsequently showed no evidence of ischemia or infarct with normal LV function, and no significant aortic or coronary calcifications.   [name] was last seen in cardiology clinic in September 2024 following ED evaluation for recurrent noncardiac chest pain, at which time she was doing well.    Over the past year, [name] has been less active.  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  With this, she has noted some weight gain, up 12  pounds since last year.  She has not been exercising outside of work.  A few weeks ago, she started noticing significant bilateral lower extremity edema.  She recently saw her primary care provider and was placed on Lasix  40 mg daily and she noted significant improvement in edema and weight loss as well.  She denies dyspnea, palpitations, PND, orthopnea, dizziness, syncope, or early satiety.  Discussing her diet, she eats 2 fast food meals per day, usually breakfast and then a late dinner  right before work (night shift).  She says she has been doing this for years and does not understand why all of a sudden she has been experiencing swelling.  She continues to have intermittent chest discomfort, typically after returning home from work early in the morning and laying down.  The discomfort has been there for several years, lasts a few minutes, and resolve spontaneously when she is able to fall off to sleep.  There were no associated symptoms.  She denies exertional chest pain or dyspnea. Objective   Home Medications    Current Outpatient Medications  Medication Sig Dispense Refill   albuterol  (VENTOLIN  HFA) 108 (90 Base) MCG/ACT inhaler Inhale 1-2 puffs into the lungs every 6 (six) hours as needed for wheezing or shortness of breath. 8 g 0   atorvastatin  (LIPITOR) 40 MG tablet Take 40 mg by mouth daily.     budesonide-formoterol (SYMBICORT) 160-4.5 MCG/ACT inhaler Inhale 2 puffs into the lungs 2 (two) times daily.     calcium  carbonate (TUMS) 500 MG chewable tablet Chew 1 tablet by mouth daily as needed for indigestion or heartburn.     cetirizine  (ZYRTEC ) 10 MG tablet TAKE 1 TABLET BY MOUTH EVERY DAY 90 tablet 3   Continuous Glucose Sensor (DEXCOM G7 SENSOR) MISC      fluconazole  (DIFLUCAN ) 100 MG tablet Take 100 mg by mouth daily.     furosemide  (LASIX ) 40 MG tablet Take 40 mg by mouth daily.     ibuprofen  (ADVIL ) 800 MG tablet Take 800 mg by mouth 3 (three) times daily.     JARDIANCE 25 MG TABS tablet Take 25 mg by mouth daily.     LANTUS  SOLOSTAR 100 UNIT/ML Solostar Pen Inject 25 Units into the skin at bedtime.     metFORMIN  (GLUCOPHAGE -XR) 500 MG 24 hr tablet Take 500 mg by mouth 2 (two) times daily.     MOUNJARO 15 MG/0.5ML Pen Inject 15 mg into the skin once a week.     nystatin cream (MYCOSTATIN) Apply 1 Application topically 2 (two) times daily.     potassium chloride  (MICRO-K ) 10 MEQ CR capsule Take 10 mEq by mouth daily.     No current facility-administered  medications for this visit.     Physical Exam    VS:  BP 100/70 (BP Location: Left Arm, Patient Position: Sitting, Cuff Size: Normal)   Pulse 89   Ht 5' 4 (1.626 m)   Wt 224 lb 4 oz (101.7 kg)   LMP 07/07/2017   SpO2 98%   BMI 38.49 kg/m  , BMI Body mass index is 38.49 kg/m.          GEN: Obese, in no acute distress. HEENT: normal. Neck: Supple, obese, difficult to gauge JVP.  No bruits or masses.  Cardiac: RRR, no murmurs, rubs, or gallops. No clubbing, cyanosis, 1+ bilateral lower extremity edema.  Radials 2+/PT 2+ and equal bilaterally.  Respiratory:  Respirations regular and unlabored, clear to auscultation bilaterally. GI: Soft, nontender, nondistended,  BS + x 4. MS: no deformity or atrophy. Skin: warm and dry, no rash. Neuro:  Strength and sensation are intact. Psych: Normal affect.  Accessory Clinical Findings    ECG personally reviewed by me today - EKG Interpretation Date/Time:  Friday May 29 2024 10:01:37 EDT Ventricular Rate:  89 PR Interval:  158 QRS Duration:  96 QT Interval:  402 QTC Calculation: 489 R Axis:   -22  Text Interpretation: Normal sinus rhythm Minimal voltage criteria for LVH, may be normal variant ( Cornell product ) Confirmed by Vivienne Bruckner [phone]) on 05/29/2024 10:07:52 AM  - no acute changes.  Lab Results  Component Value Date   WBC 4.1 04/23/2023   HGB 9.7 (L) 04/23/2023   HCT 28.8 (L) 04/23/2023   MCV 98.6 04/23/2023   PLT 221 04/23/2023   Lab Results  Component Value Date   CREATININE 0.61 04/23/2023   BUN 13 04/23/2023   NA 135 04/23/2023   K 3.2 (L) 04/23/2023   CL 105 04/23/2023   CO2 25 04/23/2023   Lab Results  Component Value Date   ALT 20 12/08/2022   AST 21 12/08/2022   ALKPHOS 76 12/08/2022   BILITOT 0.5 12/08/2022   Lab Results  Component Value Date   CHOL 101 12/09/2022   HDL 41 12/09/2022   LDLCALC 44 12/09/2022   TRIG 80 12/09/2022   CHOLHDL 2.5 12/09/2022    Lab Results  Component Value  Date   HGBA1C 8.0 (H) 12/08/2022   Lab Results  Component Value Date   TSH 1.820 04/16/2018       Assessment & Plan    1.  Lower extremity edema: Over the past few weeks, patient has been noticing significant bilateral lower extremity edema for which she saw primary care was placed on Lasix  with some improvement and weight loss.  She admits to eating too fast food meals a day, but says she has been doing this for many years and does not understand why all of a sudden is catching up with her.  She does not experience dyspnea, PND, or orthopnea.  She has 1+ bilateral lower extremity edema on examination today.  Body habitus otherwise makes exam challenging.  In light of new edema, I will arrange for 2D echocardiogram to reevaluate LV function.  I cautioned her about her salt intake and strongly encouraged her to wear compression socks and keep her legs elevated when possible.  She plans follow-up with primary care later this month for lab work.  2.  Precordial pain: Patient with a history of chest pain in April 2024 with normal troponins, ESR, and CRP.  Echo showed normal LV function.  Myoview  in August 2024 was low risk without ischemia or infarct, and also did not show any evidence of coronary calcium .  She has been doing well without chest pain or dyspnea.  She remains on statin therapy in the setting of diabetes and LDL of 92.  3.  Morbid obesity: She has gained some weight over the past year in the setting of less activity.  She also eats fast food frequently.  She is on Mounjaro and being managed by primary care.  Strongly encouraged 30 minutes of exercise at least 5 days a week and avoidance of fast and processed foods.  4.  Type 2 diabetes mellitus: Followed closely by primary care.  On metformin , Lantus , and Mounjaro.  5.  Disposition: Follow-up echocardiogram.  If echo finding stable, would plan to follow-up in 1 year.  Lonni Meager, NP 05/29/2024, 10:26 AM

## 2024-07-15 ENCOUNTER — Ambulatory Visit: Attending: Nurse Practitioner

## 2024-07-15 DIAGNOSIS — R600 Localized edema: Secondary | ICD-10-CM | POA: Insufficient documentation

## 2024-07-15 LAB — ECHOCARDIOGRAM COMPLETE
AR max vel: 2.86 cm2
AV Area VTI: 2.96 cm2
AV Area mean vel: 2.85 cm2
AV Mean grad: 3 mmHg
AV Peak grad: 5.9 mmHg
Ao pk vel: 1.21 m/s
Area-P 1/2: 3.77 cm2
S' Lateral: 3.5 cm

## 2024-07-16 ENCOUNTER — Ambulatory Visit: Payer: Self-pay | Admitting: Nurse Practitioner
